# Patient Record
Sex: Female | Born: 1975 | Race: Black or African American | Hispanic: No | Marital: Married | Smoking: Never smoker
Health system: Southern US, Community
[De-identification: ages and names within clinical notes are randomized; demographics above are authoritative.]

## PROBLEM LIST (undated history)

## (undated) DIAGNOSIS — R519 Headache, unspecified: Secondary | ICD-10-CM

## (undated) DIAGNOSIS — F419 Anxiety disorder, unspecified: Secondary | ICD-10-CM

## (undated) DIAGNOSIS — E785 Hyperlipidemia, unspecified: Secondary | ICD-10-CM

## (undated) DIAGNOSIS — R51 Headache: Secondary | ICD-10-CM

## (undated) DIAGNOSIS — K219 Gastro-esophageal reflux disease without esophagitis: Secondary | ICD-10-CM

## (undated) HISTORY — DX: Anxiety disorder, unspecified: F41.9

## (undated) HISTORY — DX: Headache, unspecified: R51.9

## (undated) HISTORY — DX: Gastro-esophageal reflux disease without esophagitis: K21.9

## (undated) HISTORY — DX: Hyperlipidemia, unspecified: E78.5

## (undated) HISTORY — PX: ABDOMINAL HYSTERECTOMY: SHX81

## (undated) HISTORY — DX: Headache: R51

---

## 2004-05-09 ENCOUNTER — Ambulatory Visit: Payer: Self-pay

## 2005-05-12 ENCOUNTER — Emergency Department: Payer: Self-pay | Admitting: Emergency Medicine

## 2006-11-02 ENCOUNTER — Emergency Department: Payer: Self-pay | Admitting: Unknown Physician Specialty

## 2007-04-24 HISTORY — PX: TUBAL LIGATION: SHX77

## 2009-09-20 ENCOUNTER — Ambulatory Visit: Payer: Self-pay

## 2009-09-22 ENCOUNTER — Ambulatory Visit: Payer: Self-pay

## 2010-03-10 ENCOUNTER — Emergency Department: Payer: Self-pay | Admitting: Emergency Medicine

## 2011-01-09 LAB — HM PAP SMEAR: HM Pap smear: NORMAL

## 2011-04-10 LAB — HM MAMMOGRAPHY: HM MAMMO: NORMAL

## 2011-05-08 ENCOUNTER — Emergency Department: Payer: Self-pay | Admitting: Emergency Medicine

## 2011-05-08 LAB — BASIC METABOLIC PANEL
Anion Gap: 11 (ref 7–16)
BUN: 7 mg/dL (ref 7–18)
Calcium, Total: 9.3 mg/dL (ref 8.5–10.1)
Chloride: 105 mmol/L (ref 98–107)
Co2: 26 mmol/L (ref 21–32)
Glucose: 103 mg/dL — ABNORMAL HIGH (ref 65–99)
Osmolality: 281 (ref 275–301)
Sodium: 142 mmol/L (ref 136–145)

## 2011-05-08 LAB — CBC
MCH: 31 pg (ref 26.0–34.0)
MCHC: 34.9 g/dL (ref 32.0–36.0)
MCV: 89 fL (ref 80–100)
Platelet: 367 10*3/uL (ref 150–440)
RDW: 11.8 % (ref 11.5–14.5)
WBC: 5.3 10*3/uL (ref 3.6–11.0)

## 2014-08-26 ENCOUNTER — Ambulatory Visit: Payer: 59 | Admitting: Physical Therapy

## 2014-08-26 ENCOUNTER — Encounter: Payer: Self-pay | Admitting: Physical Therapy

## 2014-08-26 DIAGNOSIS — N393 Stress incontinence (female) (male): Secondary | ICD-10-CM | POA: Insufficient documentation

## 2014-08-26 DIAGNOSIS — R531 Weakness: Secondary | ICD-10-CM

## 2014-08-26 DIAGNOSIS — M629 Disorder of muscle, unspecified: Secondary | ICD-10-CM

## 2014-08-26 DIAGNOSIS — R278 Other lack of coordination: Secondary | ICD-10-CM

## 2014-08-27 NOTE — Patient Instructions (Addendum)
  __log rolling and posture handouts __Bladder irritants and impact on bladder and urinary incontinence, urgency and healthy ratio to water

## 2014-08-27 NOTE — Therapy (Signed)
[place] MAIN [facility] SERVICES [address] [place], [place], [address] Phone: [phone]   Fax:  [phone]  Physical Therapy Evaluation  Patient Details  Name: Tanya Robinson MRN: [id number] Date of Birth: 1975/11/05 Referring Provider:  Nori Riis, PA-C  Encounter Date: 08/26/2014      PT End of Session - 08/27/14 1100    Visit Number 1   Number of Visits 12   Date for PT Re-Evaluation 11/19/14   PT Start Time 9030   PT Stop Time 1702   PT Time Calculation (min) 75 min   Activity Tolerance Patient limited by pain;Patient tolerated treatment well;No increased pain   Behavior During Therapy [facility] for tasks assessed/performed      Past Medical History  Diagnosis Date  . Anxiety   . Headache     Past Surgical History  Procedure Laterality Date  . Abdominal hysterectomy    . Tubal ligation  2009    There were no vitals filed for this visit.  Visit Diagnosis:  Coordination abnormal - Plan: PT plan of care cert/re-cert  Weakness - Plan: PT plan of care cert/re-cert  Fascial defect - Plan: PT plan of care cert/re-cert      Subjective Assessment - 08/26/14 1608    Subjective 1) Urinary leakage after urination 100% of the time that started 6-7 mo. 2) Dyspareunia    Pertinent History Hx vaginal deliveries w/o perineal scars/tears, tubal ligation and hysterectomy. Pt changes panty liners 8-9 pads. Frequency 1x every 2 hrs. DAILY FLUID INTAKE: water 16 fl oz= 3 , sodas reg size =2, 1 cup of juice. Fintness routine for the past 2 months 3x/week: situps/crunches 3sets of 15, treadmill 3 min, UE weights.   Patient Stated Goals get pelvic area as normal as possible to not urinate on self   Currently in Pain? Yes   Pain Score 6    Pain Location Genitalia   Pain Descriptors / Indicators Aching;Throbbing;Other (Comment)  achining with penetration, throbbing post-coitus   Pain Frequency Other (Comment)  50% of the time    Aggravating Factors  supine position,    Pain Relieving Factors other positions             [facility] PT Assessment - 08/27/14 0955    Assessment   Medical Diagnosis urinary leakage and dyspareunia    Onset Date 01/25/14   Next MD Visit --  week of 15th    Precautions   Precautions None   Restrictions   Weight Bearing Restrictions No   Balance Screen   Has the patient fallen in the past 6 months No   Has the patient had a decrease in activity level because of a fear of falling?  No   Is the patient reluctant to leave their home because of a fear of falling?  No   Observation/Other Assessments   Observations rounded shoulders, forward head, thorax collapsed over abdomen in sitting, ankles crossed    Skin Integrity abdominal scars in LQ with restricted mobilty   Other Surveys  Select  PFDI- 53%   Coordination   Coordination and Movement Description rib flare high/ limited depression of ribs, chest breathing. abdominal and pelvic floor straining w/ cue for bowel movement.    AROM   Overall AROM Comments spinal movements with WLF. slightly limited in T-spine ext                 Pelvic Floor Special Questions - 08/27/14 0923  Diastasis Recti no   Perineal Body/Introitus other no abnormal positioning of pelvic organs    External Palpation neg to tenderness    Pelvic Floor Internal Exam posterior activation of pelvic floor mm > anteror mm, applied thiele massage along 2nd layer mm and noted more circumferential contraction of pelvic floor mm post-Tx  Grade 2 without pillow, Grade 3 w/ hips on pillow & post-Tx   Exam Type Vaginal   Strength good squeeze, good lift, able to hold agaisnt strong resistance   Strength # of reps 6   Strength # of seconds 10          [facility] Adult PT Treatment/Exercise - 08/27/14 0955    Bed Mobility   Bed Mobility --  educated on log rolling 2x   Posture/Postural Control   Posture Comments educated/ demo'd proper seated/ standing posture  with stacking principles    Manual Therapy   Manual Therapy Myofascial release   Myofascial Release 2nd layers of pelvic floor mm to faciliate increased circumferential contraction                PT Education - 08/27/14 1057    Education provided Yes   Education Details education on POC, goals, compliance, and benefits of PT, Educated importance of decreased load on pelvic floor with modifications to daily activities and crunches/sit-ups in fitness routine, alignment for seated posture and technique for diaphragmatic breathing (explained benefits and physiology) for rest periods between pelvic floor contractions,    Person(s) Educated Patient   Methods Explanation;Demonstration;Tactile cues;Verbal cues;Handout   Comprehension Verbalized understanding;Returned demonstration             PT Long Term Goals - 08/27/14 1113    PT LONG TERM GOAL #1   Title Pt will report balancing out her bladder irritant: wter ratio from 3:3 to 2:4  in order to participate more community events with decreased frequency to urinate.   Time 12   Period Weeks   Status New   PT LONG TERM GOAL #2   Title  Pt will decrease score on PFDI  from  53  %  to   < 40   % in order to improve QOL.    Time 12   Period Weeks   Status New   PT LONG TERM GOAL #3   Title Pt will demo proper deep core mm techniques throughout 5 fitness exercises in order minimize worsening of Sx and promote wellness.   Time 12   Period Weeks   Status New   PT LONG TERM GOAL #4   Title Pt will increase pelvic floor endurance from 4/10/6/6 to 07/31/08/10 without pillow propped under hips in order to improve urinary Sx and decrease use of pads.   Time 12   Period Weeks   Status New   PT LONG TERM GOAL #5   Title Pt will report a decreased use of pads to < 3 pads / day in order to improve QOL and save costs.   Time 12   Period Weeks   Status New               Plan - 08/27/14 1102    Clinical Impression Statement Pt  is 39 yo female who c/o urinary leakage and urgency.   Pt will benefit from skilled therapeutic intervention in order to improve on the following deficits Decreased endurance;Improper body mechanics;Decreased scar mobility;Postural dysfunction;Decreased activity tolerance;Decreased mobility;Decreased strength;Impaired flexibility;Decreased range of motion;Decreased coordination;Increased fascial restricitons   Rehab Potential  Good   Clinical Impairments Affecting Rehab Potential Hx of hysterectemy, tubal ligation, multiple vaginal deliveries   PT Frequency 1x / week   PT Duration 12 weeks   PT Treatment/Interventions ADLs/Self Care Home Management;Gait training;Neuromuscular re-education;Aquatic Therapy;Stair training;Scar mobilization;Biofeedback;Functional mobility training;Patient/family education;Cryotherapy;Moist Heat;Balance training;Manual techniques;Therapeutic exercise;Therapeutic activities;Other (comment)  Pain science education. Relaxation techniques,  education on proper weight lifting techniques, need for modifications to fitness routine, Bladder irritants and fluid intake   PT Next Visit Plan abdominal scar massage, assess t-spine/mm   PT Home Exercise Plan pelvic floor program and abdominal massage   Consulted and Agree with Plan of Care Patient         Problem List There are no active problems to display for this patient.  Jerl Mina, PT, DPT 08/27/2014, 4:34 PM  [place] MAIN [facility] SERVICES [address] [place], [place], [address] Phone: [phone]   Fax:  [phone]

## 2014-08-30 ENCOUNTER — Encounter: Payer: Self-pay | Admitting: Physical Therapy

## 2014-09-02 ENCOUNTER — Encounter: Payer: Self-pay | Admitting: Physical Therapy

## 2014-09-02 ENCOUNTER — Ambulatory Visit: Payer: 59 | Attending: Urology | Admitting: Physical Therapy

## 2014-09-02 DIAGNOSIS — R531 Weakness: Secondary | ICD-10-CM

## 2014-09-02 DIAGNOSIS — R278 Other lack of coordination: Secondary | ICD-10-CM

## 2014-09-02 DIAGNOSIS — N393 Stress incontinence (female) (male): Secondary | ICD-10-CM | POA: Diagnosis not present

## 2014-09-02 DIAGNOSIS — M629 Disorder of muscle, unspecified: Secondary | ICD-10-CM

## 2014-09-02 NOTE — Therapy (Signed)
[place] MAIN [facility] SERVICES [address] [place], [place], [address] Phone: [phone]   Fax:  [phone]  Physical Therapy Treatment  Patient Details  Name: Tanya Robinson MRN: [id number] Date of Birth: [birth year] Referring Provider:  No ref. provider found  Encounter Date: 09/02/2014      PT End of Session - 09/02/14 1633    Visit Number 2   Number of Visits 12   Date for PT Re-Evaluation 11/19/14   PT Start Time 0932   PT Stop Time 6712   PT Time Calculation (min) 62 min   Activity Tolerance Patient limited by pain;Patient tolerated treatment well;No increased pain   Behavior During Therapy [facility] for tasks assessed/performed      Past Medical History  Diagnosis Date  . Anxiety   . Headache     Past Surgical History  Procedure Laterality Date  . Abdominal hysterectomy    . Tubal ligation  2009    There were no vitals filed for this visit.  Visit Diagnosis:  Coordination abnormal  Weakness  Fascial defect      Subjective Assessment - 09/02/14 1545    Subjective Pt reported she has been practicing HEP and no longer have pain in the pelvic floor mm. Pt has practiced not straining with bowel movement.    Pertinent History Hx vaginal deliveries w/o perineal scars/tears, tubal ligation and hysterectomy. Pt changes panty liners 8-9 pads. Frequency 1x every 2 hrs. DAILY FLUID INTAKE: water 16 fl oz= 3 , sodas reg size =2, 1 cup of juice. Fintness routine for the past 2 months 3x/week: situps/crunches 3sets of 15, treadmill 3 min, UE weights.   Patient Stated Goals get pelvic area as normal as possible to not urinate on self   Currently in Pain? No/denies                         [facility] Adult PT Treatment/Exercise - 09/02/14 1628    Bed Mobility   Bed Mobility --  reviewed log roll, minor cuing   Manual Therapy   Manual Therapy Joint mobilization;Massage;Other (comment)   Joint Mobilization C5-T12 assessed,  noted increased hypomobility at T3-10. Grade III PA SP. Grade II AC [place] joints bil sup/inf. Noted increased moboility post-Tx.   Soft tissue mobilization mm medial to scap bil= increased tensions. STM, MWM openbook. increased scapluar mobility PROM post-Tx                PT Education - 09/02/14 1633    Education Details HEP   Person(s) Educated Patient   Methods Explanation;Demonstration;Tactile cues;Verbal cues;Handout   Comprehension Verbalized understanding;Returned demonstration;Verbal cues required;Tactile cues required             PT Long Term Goals - 09/02/14 1637    PT LONG TERM GOAL #1   Title Pt will report balancing out her bladder irritant: wter ratio from 3:3 to 2:4  in order to participate more community events with decreased frequency to urinate.   Time 12   Period Weeks   Status New   PT LONG TERM GOAL #2   Title  Pt will decrease score on PFDI  from  53  %  to   < 40   % in order to improve QOL.    Time 12   Period Weeks   Status New   PT LONG TERM GOAL #3   Title Pt will demo proper deep core mm techniques throughout  5 fitness exercises in order minimize worsening of Sx and promote wellness.   Time 12   Period Weeks   Status New   PT LONG TERM GOAL #4   Title Pt will increase pelvic floor endurance from 4/10/6/6 to 07/31/08/10 without pillow propped under hips in order to improve urinary Sx and decrease use of pads.   Time 12   Period Weeks   Status New   PT LONG TERM GOAL #5   Title Pt will report a decreased use of pads to < 3 pads / day in order to improve QOL and save costs.   Time 12   Period Weeks   Status New               Plan - 09/02/14 1634    Clinical Impression Statement Pt tolerated manual Tx well to release tensions around thorax and upper shoulders. Pt demo'd increased ability with diaphragmatic excursion and gained better coordination from chest breathing to diaphragmatic breathing with excessive tactile and verbal cues.  Initiated work stretches for increasing thorax mobility. Pt is progress well towards her goals and will benefit from skilled PT to progress to deep core strengthening.    Pt will benefit from skilled therapeutic intervention in order to improve on the following deficits Decreased endurance;Improper body mechanics;Decreased scar mobility;Postural dysfunction;Decreased activity tolerance;Decreased mobility;Decreased strength;Impaired flexibility;Decreased range of motion;Decreased coordination;Increased fascial restricitons   Rehab Potential Good   Clinical Impairments Affecting Rehab Potential Hx of hysterectemy, tubal ligation, multiple vaginal deliveries   PT Frequency 1x / week   PT Duration 12 weeks   PT Treatment/Interventions ADLs/Self Care Home Management;Gait training;Neuromuscular re-education;Aquatic Therapy;Stair training;Scar mobilization;Biofeedback;Functional mobility training;Patient/family education;Cryotherapy;Moist Heat;Balance training;Manual techniques;Therapeutic exercise;Therapeutic activities;Other (comment)  Pain science education. Relaxation techniques,  education on proper weight lifting techniques, need for modifications to fitness routine, Bladder irritants and fluid intake   PT Next Visit Plan abdominal scar massage, assess t-spine/mm   PT Home Exercise Plan pelvic floor program and abdominal massage   Consulted and Agree with Plan of Care Patient        Problem List There are no active problems to display for this patient.   Jerl Mina ,PT, DPT, E-RYT  09/02/2014, 5:17 PM  [place] MAIN [facility] SERVICES [address] [place], [place], [address] Phone: [phone]   Fax:  [phone]

## 2014-09-02 NOTE — Patient Instructions (Addendum)
Handout  open book 15x reps. bil  seated twist 3 reps bil  T-spine ext with deep core engaged,   diaphragmatic breathing in supine and prone. Tactile cuing required.   Neuro-reedu: proper sitting posture with thorax over pelvis and with T-spine ext ex

## 2014-09-08 ENCOUNTER — Ambulatory Visit: Payer: 59 | Admitting: Physical Therapy

## 2014-09-08 ENCOUNTER — Encounter: Payer: Self-pay | Admitting: Physical Therapy

## 2014-09-08 DIAGNOSIS — M629 Disorder of muscle, unspecified: Secondary | ICD-10-CM

## 2014-09-08 DIAGNOSIS — R531 Weakness: Secondary | ICD-10-CM

## 2014-09-08 DIAGNOSIS — N393 Stress incontinence (female) (male): Secondary | ICD-10-CM | POA: Diagnosis not present

## 2014-09-08 DIAGNOSIS — R278 Other lack of coordination: Secondary | ICD-10-CM

## 2014-09-09 NOTE — Patient Instructions (Signed)
Wall Squat   Feet shoulder width apart, a few inches in front of wall, lean against wall by squeezing shoulder blades together and back of head pressed against the wall. Inhale as you lower by bending hips and knees just enough that you can still see your toes.  Exhale, engage deep core muscles as you rise up to stand. Repeat _10___ times. Do _2-3___ sessions per day.  Copyright  VHI. All rights reserved.    You are now ready to begin training the deep core muscles system: diaphragm, transverse abdominis, pelvic floor . These muscles must work together as a team.      The key to these exercises to train the brain to coordinate the timing of these muscles and to have them turn on for long periods of time to hold you upright against gravity (especially important if you are on your feet all day).These muscles are postural muscles and play a role stabilizing your spine and bodyweight. By doing these repetitions slowly and correctly instead of doing crunches, you will achieve a flatter belly without a lower pooch. You are also placing your spine in a more neutral position and breathing properly which in turn, decreases your risk for problems related to your pelvic floor, abdominal, and low back such as pelvic organ prolapse, hernias, diastasis recti (separation of superficial muscles), disk herniations, spinal fractures. These exercises set a solid foundation for you to later progress to resistance/ strength training with therabands and weights and return to other typical fitness exercises with a stronger deeper core.    This week: Practice only Level 1-2. Hold off on 3-4 until our next visit.   -4 until our next visit.

## 2014-09-09 NOTE — Therapy (Signed)
[place] MAIN [facility] SERVICES [address] [place], [place], [address] Phone: [phone]   Fax:  [phone]  Physical Therapy Treatment  Patient Details  Name: NAVIL KOLE MRN: [id number] Date of Birth: [birth year] Referring Provider:  Nori Riis, PA-C  Encounter Date: 09/08/2014    Past Medical History  Diagnosis Date  . Anxiety   . Headache     Past Surgical History  Procedure Laterality Date  . Abdominal hysterectomy    . Tubal ligation  2009    There were no vitals filed for this visit.  Visit Diagnosis:  Coordination abnormal  Weakness  Fascial defect      Subjective Assessment - 09/08/14 1521    Subjective Pt report she has been decreasing soda to .5 can/day and increased her water intake. Pt reported she has been doing her pelvic floor HEP.    Pertinent History Hx vaginal deliveries w/o perineal scars/tears, tubal ligation and hysterectomy. Pt changes panty liners 8-9 pads. Frequency 1x every 2 hrs. DAILY FLUID INTAKE: water 16 fl oz= 3 , sodas reg size =2, 1 cup of juice. Fintness routine for the past 2 months 3x/week: situps/crunches 3sets of 15, treadmill 3 min, UE weights.   Patient Stated Goals get pelvic area as normal as possible to not urinate on self   Currently in Pain? No/denies                         [facility] Adult PT Treatment/Exercise - 09/08/14 1610    Bed Mobility   Bed Mobility Left Sidelying to Sit;Rolling Left  reviewed log roll, moderate cuing 3 reps   Ambulation/Gait   Gait Comments cues for scap retraction/ deep core activation    Exercises   Exercises --  grade 4 w/o pillow, 10 secs, 7 reps w/ cuing to count aloud   Other Exercises  Dyn Stabilization 1-2.  10 reps each, cuing to breathe thru nose, resolved c/o of "running out of  breath"    Knee/Hip Exercises: Standing   Wall Squat 10 reps  cues for scap/ cervical retraction, knee alignment                      PT Long Term Goals - 09/08/14 1525    PT LONG TERM GOAL #1   Title Pt will report balancing out her bladder irritant: wter ratio from 3:3 to 2:4  in order to participate more community events with decreased frequency to urinate.   Time 12   Period Weeks   Status Achieved   PT LONG TERM GOAL #2   Title  Pt will decrease score on PFDI  from  53  %  to   < 40   % in order to improve QOL.    Time 12   Period Weeks   Status On-going   PT LONG TERM GOAL #3   Title Pt will demo proper deep core mm techniques throughout 5 fitness exercises in order minimize worsening of Sx and promote wellness.   Time 12   Period Weeks   Status On-going   PT LONG TERM GOAL #4   Title Pt will increase pelvic floor endurance from 4/10/6/6 to 07/31/08/10 without pillow propped under hips in order to improve urinary Sx and decrease use of pads.   Time 12   Period Weeks   Status On-going   PT LONG TERM GOAL #5   Title Pt will  report a decreased use of pads to < 3 pads / day in order to improve QOL and save costs.   Time 12   Period Weeks   Status On-going               Plan - 09/09/14 0813    Clinical Impression Statement Pt showed significantly improved mm and spinal mobility compared to last session and demo'd proper posture with minimal cuing. Pt was able to progress her pelvic floor HEP and start dynamic stabilization Level 1-2 today. Pt has achieved one goal w/ decreased bladder irritant intake. Pt would continue to benefit from skilled PT to  address her remaining goals.    Pt will benefit from skilled therapeutic intervention in order to improve on the following deficits Decreased endurance;Improper body mechanics;Decreased scar mobility;Postural dysfunction;Decreased activity tolerance;Decreased mobility;Decreased strength;Impaired flexibility;Decreased range of motion;Decreased coordination;Increased fascial restricitons   Rehab Potential Good   Clinical  Impairments Affecting Rehab Potential Hx of hysterectemy, tubal ligation, multiple vaginal deliveries   PT Frequency 1x / week   PT Duration 12 weeks   PT Treatment/Interventions ADLs/Self Care Home Management;Gait training;Neuromuscular re-education;Aquatic Therapy;Stair training;Scar mobilization;Biofeedback;Functional mobility training;Patient/family education;Cryotherapy;Moist Heat;Balance training;Manual techniques;Therapeutic exercise;Therapeutic activities;Other (comment)  Pain science education. Relaxation techniques,  education on proper weight lifting techniques, need for modifications to fitness routine, Bladder irritants and fluid intake   PT Next Visit Plan abdominal scar massage,    Consulted and Agree with Plan of Care Patient        Problem List There are no active problems to display for this patient.   Jerl Mina ,PT, DPT, E-RYT  09/09/2014, 8:17 AM  [place] MAIN [facility] SERVICES [address] [place], [place], [address] Phone: [phone]   Fax:  [phone]

## 2014-09-15 ENCOUNTER — Ambulatory Visit: Payer: 59 | Admitting: Physical Therapy

## 2014-09-15 ENCOUNTER — Encounter: Payer: Self-pay | Admitting: Physical Therapy

## 2014-09-17 ENCOUNTER — Ambulatory Visit: Payer: 59 | Admitting: Physical Therapy

## 2014-09-22 ENCOUNTER — Encounter: Payer: Self-pay | Admitting: Physical Therapy

## 2014-09-29 ENCOUNTER — Ambulatory Visit: Payer: 59 | Attending: Urology | Admitting: Physical Therapy

## 2014-09-29 DIAGNOSIS — R278 Other lack of coordination: Secondary | ICD-10-CM | POA: Diagnosis not present

## 2014-09-29 DIAGNOSIS — R531 Weakness: Secondary | ICD-10-CM | POA: Insufficient documentation

## 2014-09-29 DIAGNOSIS — R32 Unspecified urinary incontinence: Secondary | ICD-10-CM | POA: Diagnosis not present

## 2014-09-29 DIAGNOSIS — N941 Dyspareunia: Secondary | ICD-10-CM | POA: Diagnosis not present

## 2014-09-29 DIAGNOSIS — M629 Disorder of muscle, unspecified: Secondary | ICD-10-CM

## 2014-09-29 NOTE — Patient Instructions (Addendum)
1) Dyn Stab 3-4 10 reps each   2) Handout w/ drawn pictures: 10 reps each  1. Modified pilates  Yellow band on doorknob (elbow bent, hip ER, bridging up w/ heels on stool) 2. Modified pilates   Yellow band on door knob  (elbow straight, bridging up w/ heels and knees parallel  on stool) 3. Relaxation 5 min (towel over eyes)   3)a. pelvic floor 10 sec up to 50 /day 10 x at 8 am, noon, 6pm 5x at 10 am, 2pm, 4pm    B. Quick squeezes coughing, sneezing, laughing

## 2014-09-29 NOTE — Therapy (Signed)
[facility] MAIN REHAB SERVICES [address] [place], [place], [address] Phone: [phone]   Fax:  [phone]  Physical Therapy Treatment  Patient Details  Name: Tanya Robinson MRN: [id number] Date of Birth: 10/03/1975 Referring Provider:  No ref. provider found  Encounter Date: 09/29/2014      PT End of Session - 09/29/14 1503    Visit Number 3   Number of Visits 12   Date for PT Re-Evaluation 11/19/14   PT Start Time 1505   Activity Tolerance Patient limited by pain;Patient tolerated treatment well;No increased pain   Behavior During Therapy WFL for tasks assessed/performed      Past Medical History  Diagnosis Date  . Anxiety   . Headache     Past Surgical History  Procedure Laterality Date  . Abdominal hysterectomy    . Tubal ligation  2009    There were no vitals filed for this visit.  Visit Diagnosis:  Coordination abnormal  Weakness  Fascial defect      Subjective Assessment - 09/29/14 1503    Subjective Pt reported resolution of pelvic pain,  her bowel movements improved without straining, decreased leakage from every toileting trip to only 2x/day across the two week, and changing her panty liners from 8-9 pads to 3 pads/ days. Pt continued walking.    Pertinent History Hx vaginal deliveries w/o perineal scars/tears, tubal ligation and hysterectomy. Pt changes panty liners 8-9 pads. Frequency 1x every 2 hrs. DAILY FLUID INTAKE: water 16 fl oz= 3 , sodas reg size =2, 1 cup of juice. Fintness routine for the past 2 months 3x/week: situps/crunches 3sets of 15, treadmill 3 min, UE weights.   Patient Stated Goals get pelvic area as normal as possible to not urinate on self   Currently in Pain? No/denies                         [facility] Adult PT Treatment/Exercise - 09/29/14 0001    Exercises   Other Exercises  Dyn 3-4 10 reps, modified pilates I-2 yellow band 10 reps   cuing for alignment and activation of  whole body and breath                 PT Education - 09/29/14 1547    Education provided Yes   Education Details HEP   Person(s) Educated Patient   Methods Explanation;Tactile cues;Demonstration;Verbal cues;Handout   Comprehension Verbalized understanding;Returned demonstration             PT Long Term Goals - 09/29/14 1549    PT LONG TERM GOAL #1   Title Pt will report balancing out her bladder irritant: wter ratio from 3:3 to 2:4  in order to participate more community events with decreased frequency to urinate.   Time 12   Period Weeks   Status Achieved   PT LONG TERM GOAL #2   Title  Pt will decrease score on PFDI  from  53  %  to   < 40   % in order to improve QOL.    Time 12   Period Weeks   Status On-going   PT LONG TERM GOAL #3   Title Pt will demo proper deep core mm techniques throughout 5 fitness exercises in order minimize worsening of Sx and promote wellness.   Time 12   Period Weeks   Status Partially Met   PT LONG TERM GOAL #4   Title Pt will   increase pelvic floor endurance from 4/10/6/6 to 07/31/08/10 without pillow propped under hips in order to improve urinary Sx and decrease use of pads.   Time 12   Period Weeks   Status On-going   PT LONG TERM GOAL #5   Title Pt will report a decreased use of pads to < 3 pads / day in order to improve QOL and save costs.   Time 12   Period Weeks   Status Achieved               Plan - 09/29/14 1550    Clinical Impression Statement Pt has achieved the goal related to decreased pad use. Progressed to dynamic stabilization 3-4 and modified pilates w/ yellow theraband. Pt showed fatigue and poor form by 10th rep. Pt also required neuro-reedu for coordination of pelvic floor mm.  Anticipate pt will be  ready for upright exercises at next session.  Pt will continue to benefit from skilled PT.    Pt will benefit from skilled therapeutic intervention in order to improve on the following deficits Decreased  endurance;Improper body mechanics;Decreased scar mobility;Postural dysfunction;Decreased activity tolerance;Decreased mobility;Decreased strength;Impaired flexibility;Decreased range of motion;Decreased coordination;Increased fascial restricitons   Rehab Potential Good   Clinical Impairments Affecting Rehab Potential Hx of hysterectemy, tubal ligation, multiple vaginal deliveries   PT Frequency 1x / week   PT Duration 12 weeks   PT Treatment/Interventions ADLs/Self Care Home Management;Gait training;Neuromuscular re-education;Aquatic Therapy;Stair training;Scar mobilization;Biofeedback;Functional mobility training;Patient/family education;Cryotherapy;Moist Heat;Balance training;Manual techniques;Therapeutic exercise;Therapeutic activities;Other (comment)  Pain science education. Relaxation techniques,  education on proper weight lifting techniques, need for modifications to fitness routine, Bladder irritants and fluid intake   PT Next Visit Plan abdominal scar massage,    Consulted and Agree with Plan of Care Patient        Problem List There are no active problems to display for this patient.   Jerl Mina 09/29/2014, 4:01 PM  [facility] MAIN [facility] SERVICES [address] [place], [place], [address] Phone: [phone]   Fax:  [phone]

## 2014-10-06 ENCOUNTER — Encounter: Payer: Self-pay | Admitting: Physical Therapy

## 2014-10-13 ENCOUNTER — Ambulatory Visit: Payer: 59 | Admitting: Physical Therapy

## 2014-10-13 DIAGNOSIS — R32 Unspecified urinary incontinence: Secondary | ICD-10-CM | POA: Diagnosis not present

## 2014-10-13 DIAGNOSIS — R278 Other lack of coordination: Secondary | ICD-10-CM

## 2014-10-13 DIAGNOSIS — M629 Disorder of muscle, unspecified: Secondary | ICD-10-CM

## 2014-10-13 DIAGNOSIS — R531 Weakness: Secondary | ICD-10-CM

## 2014-10-13 NOTE — Patient Instructions (Addendum)
  Pelvic floor exercises --keep count with fingers, and enjoy the rest break of 4 breaths and lengthen pelvic floor. (elevator)   Continue with yellow band bridging exercise -10 x/ 2 sets/ 2x day.    Clam Shell 45 Degrees with Red band   Lying with hips and knees bent 45, one pillow between knees and ankles. Lift knee with exhale. Be sure pelvis does not roll backward. Do not arch back. Do 10 times, each leg, 2 times per day.  http://ss.exer.us/75   Copyright  VHI. All rights reserved.      Mini Squat: NOT WITH BALL--USE RED BAND on THIGH INSTEAD!    Stand with band over thighs. Squat with head up, reaching back with buttocks as if sitting down. Make sure you can still see your toes and pushing down with four corners of feet as you exhale up.  Repeat 10 times per set. Do 1 sets per session. Do 2sessions per day.  http://orth.exer.us/731   Copyright  VHI. All rights reserved.

## 2014-10-14 NOTE — Therapy (Signed)
[place] MAIN [facility] SERVICES [address] [place], [place], [address] Phone: [phone]   Fax:  [phone]  Physical Therapy Treatment  Patient Details  Name: Tanya Robinson MRN: [id number] Date of Birth: [birth year] Referring Provider:  No ref. provider found  Encounter Date: 10/13/2014      PT End of Session - 10/13/14 0911    Visit Number 4   Number of Visits 12   Date for PT Re-Evaluation 11/19/14   PT Start Time 0907   PT Stop Time 1000   PT Time Calculation (min) 53 min   Activity Tolerance Patient limited by pain;Patient tolerated treatment well;No increased pain   Behavior During Therapy [facility] for tasks assessed/performed      Past Medical History  Diagnosis Date  . Anxiety   . Headache     Past Surgical History  Procedure Laterality Date  . Abdominal hysterectomy    . Tubal ligation  2009    There were no vitals filed for this visit.  Visit Diagnosis:  Coordination abnormal  Weakness  Fascial defect      Subjective Assessment - 10/14/14 1432    Subjective Pt reports her Sx are better but still using 3 pads/ day. Urinary leakage has improved by 75%.    Pertinent History Hx vaginal deliveries w/o perineal scars/tears, tubal ligation and hysterectomy. Pt changes panty liners 8-9 pads. Frequency 1x every 2 hrs. DAILY FLUID INTAKE: water 16 fl oz= 3 , sodas reg size =2, 1 cup of juice. Fintness routine for the past 2 months 3x/week: situps/crunches 3sets of 15, treadmill 3 min, UE weights.   Patient Stated Goals get pelvic area as normal as possible to not urinate on self                      Pelvic Floor Special Questions - 10/14/14 1441    Pelvic Floor Internal Exam circumerefential contraction and without pillow under hip  delayed relaxation, cue for rest period of 4 breaths.    Exam Type Vaginal   Strength strong squeeze, against strong resistance   Strength # of reps 10   Strength # of seconds 10    Biofeedback required cue for  complete relaxation of pelvic floor mm, required "elevator" imagery and tactile cuing  noted hyperactivity of pelvic floor upon digital  insertion           [facility] Adult PT Treatment/Exercise - 10/14/14 1432    Bed Mobility   Bed Mobility Sit to Supine  showed good carry over   Exercises   Other Exercises  reviewed modified pilates (provided more cues for neck/scap engagement)  10 reps   Knee/Hip Exercises: Standing   Functional Squat 20 reps  cues for alignment/coordination (red valgus band over thighs   Knee/Hip Exercises: Sidelying   Clams with red band 10 reps bil  deep core engaged                PT Education - 10/14/14 1442    Education provided Yes   Education Details HEP   Person(s) Educated Patient   Methods Explanation;Demonstration;Tactile cues;Verbal cues;Handout   Comprehension Verbalized understanding;Returned demonstration             PT Long Term Goals - 10/13/14 0910    PT LONG TERM GOAL #1   Title Pt will report balancing out her bladder irritant: wter ratio from 3:3 to 2:4  in order to participate more community events with  decreased frequency to urinate.   Time 12   Period Weeks   Status Achieved   PT LONG TERM GOAL #2   Title  Pt will decrease score on PFDI  from  53  %  to   < 40   % in order to improve QOL.    Time 12   Period Weeks   Status On-going   PT LONG TERM GOAL #3   Title Pt will demo proper deep core mm techniques throughout 5 fitness exercises in order minimize worsening of Sx and promote wellness.   Time 12   Period Weeks   Status Partially Met   PT LONG TERM GOAL #4   Title Pt will increase pelvic floor endurance from 4/10/6/6 to 07/31/08/10 without pillow propped under hips in order to improve urinary Sx and decrease use of pads.   Time 12   Period Weeks   Status Achieved   PT LONG TERM GOAL #5   Title Pt will report a decreased use of pads to < 3 pads / day in order to improve QOL  and save costs.   Time 12   Period Weeks   Status Achieved               Plan - 10/14/14 1443    Clinical Impression Statement Pt showed hyperactivity of the pelvic floor mm and delayed relaxation of the pelvic floor mm, thus required cuing to not rush through her pelvic floor HEP and to not overdo the contractions by keeping count. Otherwise, pt demo increased pelvic floor endurance without hips elevated on pillow which indicate increased tensigrity of her abdominopelvic area. Pt is progressing well and gaining awareness of proper alignment and proper mm engagement in fitness type of exercises and will continue to benefit from skilled PT for 2-3 more visits in order to achieve her goals.    Pt will benefit from skilled therapeutic intervention in order to improve on the following deficits Decreased endurance;Improper body mechanics;Decreased scar mobility;Postural dysfunction;Decreased activity tolerance;Decreased mobility;Decreased strength;Impaired flexibility;Decreased range of motion;Decreased coordination;Increased fascial restricitons   Rehab Potential Good   Clinical Impairments Affecting Rehab Potential Hx of hysterectemy, tubal ligation, multiple vaginal deliveries   PT Frequency 1x / week   PT Duration 12 weeks   PT Treatment/Interventions ADLs/Self Care Home Management;Gait training;Neuromuscular re-education;Aquatic Therapy;Stair training;Scar mobilization;Biofeedback;Functional mobility training;Patient/family education;Cryotherapy;Moist Heat;Balance training;Manual techniques;Therapeutic exercise;Therapeutic activities;Other (comment)  Pain science education. Relaxation techniques,  education on proper weight lifting techniques, need for modifications to fitness routine, Bladder irritants and fluid intake   PT Next Visit Plan abdominal scar massage   Consulted and Agree with Plan of Care Patient        Problem List There are no active problems to display for this  patient.   Jerl Mina ,PT, DPT, E-RYT  10/14/2014, 2:50 PM  [place] MAIN [facility] SERVICES [address] [place], [place], [address] Phone: [phone]   Fax:  [phone]

## 2014-10-20 ENCOUNTER — Encounter: Payer: Self-pay | Admitting: Physical Therapy

## 2014-10-28 ENCOUNTER — Ambulatory Visit: Payer: 59 | Attending: Urology | Admitting: Physical Therapy

## 2014-10-28 DIAGNOSIS — X58XXXS Exposure to other specified factors, sequela: Secondary | ICD-10-CM | POA: Insufficient documentation

## 2014-10-28 DIAGNOSIS — S93401S Sprain of unspecified ligament of right ankle, sequela: Secondary | ICD-10-CM | POA: Diagnosis present

## 2014-10-28 DIAGNOSIS — R531 Weakness: Secondary | ICD-10-CM

## 2014-10-28 DIAGNOSIS — R278 Other lack of coordination: Secondary | ICD-10-CM

## 2014-10-28 DIAGNOSIS — S63501S Unspecified sprain of right wrist, sequela: Secondary | ICD-10-CM

## 2014-10-28 DIAGNOSIS — M629 Disorder of muscle, unspecified: Secondary | ICD-10-CM

## 2014-10-29 NOTE — Therapy (Signed)
[place] MAIN [facility] SERVICES [address] [place], [place], [address] Phone: [phone]   Fax:  [phone]  Physical Therapy Treatment  Patient Details  Name: Tanya Robinson MRN: [id number] Date of Birth: 08-Aug-1975 Referring Provider:  Nori Riis, PA-C  Encounter Date: 10/28/2014      PT End of Session - 10/29/14 2210    Visit Number 5   Number of Visits 12   Date for PT Re-Evaluation 11/19/14   PT Start Time 1600   PT Stop Time 1700   PT Time Calculation (min) 60 min   Activity Tolerance Patient limited by pain;Patient tolerated treatment well;No increased pain   Behavior During Therapy [facility] for tasks assessed/performed      Past Medical History  Diagnosis Date  . Anxiety   . Headache     Past Surgical History  Procedure Laterality Date  . Abdominal hysterectomy    . Tubal ligation  2009    There were no vitals filed for this visit.  Visit Diagnosis:  Coordination abnormal - Plan: PT plan of care cert/re-cert  Weakness - Plan: PT plan of care cert/re-cert  Fascial defect - Plan: PT plan of care cert/re-cert  Wrist sprain, right, sequela - Plan: PT plan of care cert/re-cert  Ankle sprain, right, sequela - Plan: PT plan of care cert/re-cert      Subjective Assessment - 10/28/14 1609    Subjective A week ago, pt fell while going up the stairs too quickly and was talking/turning which caused her to miss a step. Pt landed on R wrist in flexion and twisted her R ankle, applied RICE. Currently she has no pain in either areas. Pt reports her HEP have been  working out for her.  Pt has not been relying on urinary pads for 1 week.     Pertinent History Hx vaginal deliveries w/o perineal scars/tears, tubal ligation and hysterectomy. Pt changes panty liners 8-9 pads. Frequency 1x every 2 hrs. DAILY FLUID INTAKE: water 16 fl oz= 3 , sodas reg size =2, 1 cup of juice. Fintness routine for the past 2 months 3x/week:  situps/crunches 3sets of 15, treadmill 3 min, UE weights.   Patient Stated Goals get pelvic area as normal as possible to not urinate on self            [facility] PT Assessment - 10/29/14 2128    ROM / Strength   AROM / PROM / Strength --  eversion R 4/5, L 5/5. wrist strength WNL bil   AROM   Overall AROM Comments bil ankle AROM/PROM WNL   Palpation   Palpation comment no swelling around bil ankle   slight swelling over R wrist > L                  Pelvic Floor Special Questions - 10/29/14 2136    Pelvic Floor Internal Exam circumferential without pillow    Exam Type Vaginal   Strength strong squeeze, against strong resistance   Strength # of reps 10   Strength # of seconds 10   Biofeedback good carry w/ relaxing pelvic floor mm. cuing only for contraction after exhaling           [facility] Adult PT Treatment/Exercise - 10/29/14 2128    Bed Mobility   Bed Mobility --  required cuing for log rolling   Therapeutic Activites    Therapeutic Activities Other Therapeutic Activities   Other Therapeutic Activities treadmill walking 6 min level  ground w/o UE support cues for lighter landing, exhaling for deep core, w/o arm support to allow trunkrotation/arm swing, to perform warm-up on level ground (pt self-selects for walking on incline at beginning of workout), jogging on level ground 2 min at 2.5 mph w/ similar cues.   no ankle pain post-treadmill. pt demo'd proper stretches.   Neuro Re-ed    Neuro Re-ed Details  1. walking/running cues 2.squat: good carry over w/ knee /ankle alignment. progressed to squat w/10 dumbbell by sternum. cues for decreased lumbar lordosis,   no back pain post-proper form cuing.                PT Education - 10/29/14 2209    Education provided Yes   Education Details HEP   Person(s) Educated Patient   Methods Explanation;Demonstration;Tactile cues;Verbal cues   Comprehension Verbalized understanding;Returned demonstration              PT Long Term Goals - 10/29/14 2220    PT LONG TERM GOAL #1   Title Pt will report balancing out her bladder irritant: wter ratio from 3:3 to 2:4  in order to participate more community events with decreased frequency to urinate.   Time 12   Period Weeks   Status Achieved   PT LONG TERM GOAL #2   Title  Pt will decrease score on PFDI  from  53  %  to   < 40   % in order to improve QOL.    Time 12   Period Weeks   Status On-going   PT LONG TERM GOAL #3   Title Pt will demo proper deep core mm techniques throughout 5 fitness exercises in order minimize worsening of Sx and promote wellness.   Time 12   Period Weeks   Status Partially Met   PT LONG TERM GOAL #4   Title Pt will increase pelvic floor endurance from 4/10/6/6 to 07/31/08/10 without pillow propped under hips in order to improve urinary Sx and decrease use of pads.   Time 12   Period Weeks   Status Achieved   PT LONG TERM GOAL #5   Title Pt will report a decreased use of pads to < 3 pads / day in order to improve QOL and save costs.    Time 12   Period Weeks   Status Achieved   Additional Long Term Goals   Additional Long Term Goals Yes   PT LONG TERM GOAL #6   Title Pt will demo increased eversion strength on R ankle from 4/5 to 5/5 in order to minimize risk for injuries when running.   Time 12   Period Weeks   Status New               Plan - 10/29/14 2211    Clinical Impression Statement Pt has achieved her goal as she has no dependency of urinary pads for the past week. Pt showed good carry over w/ ability to relax pelvic floor properly w. Lorelei Pont and also showed decreased knee valgus w/ squats. Pt required minor cuing for contraction of pelvic floor post-exhalation and for more posterior COM in squat to protect knee joints. Due to pt's report of fall, assessment of pt's R ankle and wrist showed decreased ankle eversion strength and  minor swelling on R wrist with normal AROM/PROM/ strength.  Although, pt has been able to resume ADLs without pain, pt will benefit from skilled PT to increase her ankle strength and continue being monitored  for her wrist in order to minimize her risk for injuries w/ fitness activities. Therefore, additional diagnosis codes have been added to this re-cert. Anticipate pt will continue to progress well towards her goals.     Pt will benefit from skilled therapeutic intervention in order to improve on the following deficits Decreased endurance;Improper body mechanics;Decreased scar mobility;Postural dysfunction;Decreased activity tolerance;Decreased mobility;Decreased strength;Impaired flexibility;Decreased range of motion;Decreased coordination;Increased fascial restricitons   Rehab Potential Good   Clinical Impairments Affecting Rehab Potential Hx of hysterectemy, tubal ligation, multiple vaginal deliveries   PT Frequency 1x / week   PT Duration 12 weeks   PT Treatment/Interventions ADLs/Self Care Home Management;Gait training;Neuromuscular re-education;Aquatic Therapy;Stair training;Scar mobilization;Biofeedback;Functional mobility training;Patient/family education;Cryotherapy;Moist Heat;Balance training;Manual techniques;Therapeutic exercise;Therapeutic activities;Other (comment)  Pain science education. Relaxation techniques,  education on proper weight lifting techniques, need for modifications to fitness routine, Bladder irritants and fluid intake   PT Next Visit Plan Initiate resistive strengthening R ankle eversion   Consulted and Agree with Plan of Care Patient        Problem List There are no active problems to display for this patient.   Jerl Mina ,PT, DPT, E-RYT  10/29/2014, 10:34 PM  [place] MAIN [facility] SERVICES [address] [place], [place], [address] Phone: [phone]   Fax:  [phone]

## 2014-10-29 NOTE — Patient Instructions (Addendum)
Progress to seated pelvic floor mm contractions  5 reps x 3 sets at work, 5 reps x 3 sets laying down at night/ total 30  Per day  Applying running gait cues: land soft, allow arm swing (no hands on rail), breathing out for deep core activation, warm-up/cool down with level ground (not w/ incline)  This week: walking only due to recent minor injury to ankle, next week: 10 min walk, 10 min jog, 10 min walk  Squats with 10# weight (5 reps)  w/ more posterior COM and no sway back ( tactile cue w/ thumb on ribs and index finger on ASIS)

## 2014-11-02 ENCOUNTER — Encounter: Payer: Self-pay | Admitting: Family Medicine

## 2014-11-02 ENCOUNTER — Encounter (INDEPENDENT_AMBULATORY_CARE_PROVIDER_SITE_OTHER): Payer: Self-pay

## 2014-11-02 ENCOUNTER — Ambulatory Visit (INDEPENDENT_AMBULATORY_CARE_PROVIDER_SITE_OTHER): Payer: 59 | Admitting: Family Medicine

## 2014-11-02 VITALS — BP 102/78 | HR 75 | Temp 98.0°F | Resp 14 | Ht 65.5 in | Wt 169.0 lb

## 2014-11-02 DIAGNOSIS — G43001 Migraine without aura, not intractable, with status migrainosus: Secondary | ICD-10-CM | POA: Diagnosis not present

## 2014-11-02 DIAGNOSIS — E785 Hyperlipidemia, unspecified: Secondary | ICD-10-CM | POA: Insufficient documentation

## 2014-11-02 DIAGNOSIS — R32 Unspecified urinary incontinence: Secondary | ICD-10-CM | POA: Insufficient documentation

## 2014-11-02 DIAGNOSIS — E663 Overweight: Secondary | ICD-10-CM | POA: Insufficient documentation

## 2014-11-02 DIAGNOSIS — E559 Vitamin D deficiency, unspecified: Secondary | ICD-10-CM | POA: Insufficient documentation

## 2014-11-02 DIAGNOSIS — R319 Hematuria, unspecified: Secondary | ICD-10-CM | POA: Insufficient documentation

## 2014-11-02 DIAGNOSIS — G43009 Migraine without aura, not intractable, without status migrainosus: Secondary | ICD-10-CM | POA: Insufficient documentation

## 2014-11-02 MED ORDER — TRAMADOL HCL 50 MG PO TABS: 50.0000 mg | ORAL_TABLET | Freq: Three times a day (TID) | ORAL | Status: DC | PRN

## 2014-11-02 MED ORDER — PREDNISONE 20 MG PO TABS: 20.0000 mg | ORAL_TABLET | Freq: Two times a day (BID) | ORAL | Status: DC

## 2014-11-02 MED ORDER — BACLOFEN 20 MG PO TABS: 20.0000 mg | ORAL_TABLET | Freq: Three times a day (TID) | ORAL | Status: DC

## 2014-11-02 NOTE — Patient Instructions (Signed)
Migraine Headache A migraine headache is an intense, throbbing pain on one or both sides of your head. A migraine can last for 30 minutes to several hours. CAUSES  The exact cause of a migraine headache is not always known. However, a migraine may be caused when nerves in the brain become irritated and release chemicals that cause inflammation. This causes pain. Certain things may also trigger migraines, such as:  Alcohol.  Smoking.  Stress.  Menstruation.  Aged cheeses.  Foods or drinks that contain nitrates, glutamate, aspartame, or tyramine.  Lack of sleep.  Chocolate.  Caffeine.  Hunger.  Physical exertion.  Fatigue.  Medicines used to treat chest pain (nitroglycerine), birth control pills, estrogen, and some blood pressure medicines. SIGNS AND SYMPTOMS  Pain on one or both sides of your head.  Pulsating or throbbing pain.  Severe pain that prevents daily activities.  Pain that is aggravated by any physical activity.  Nausea, vomiting, or both.  Dizziness.  Pain with exposure to bright lights, loud noises, or activity.  General sensitivity to bright lights, loud noises, or smells. Before you get a migraine, you may get warning signs that a migraine is coming (aura). An aura may include:  Seeing flashing lights.  Seeing bright spots, halos, or zigzag lines.  Having tunnel vision or blurred vision.  Having feelings of numbness or tingling.  Having trouble talking.  Having muscle weakness. DIAGNOSIS  A migraine headache is often diagnosed based on:  Symptoms.  Physical exam.  A CT scan or MRI of your head. These imaging tests cannot diagnose migraines, but they can help rule out other causes of headaches. TREATMENT Medicines may be given for pain and nausea. Medicines can also be given to help prevent recurrent migraines.  HOME CARE INSTRUCTIONS  Only take over-the-counter or prescription medicines for pain or discomfort as directed by your  health care provider. The use of long-term narcotics is not recommended.  Lie down in a dark, quiet room when you have a migraine.  Keep a journal to find out what may trigger your migraine headaches. For example, write down:  What you eat and drink.  How much sleep you get.  Any change to your diet or medicines.  Limit alcohol consumption.  Quit smoking if you smoke.  Get 7-9 hours of sleep, or as recommended by your health care provider.  Limit stress.  Keep lights dim if bright lights bother you and make your migraines worse. SEEK IMMEDIATE MEDICAL CARE IF:   Your migraine becomes severe.  You have a fever.  You have a stiff neck.  You have vision loss.  You have muscular weakness or loss of muscle control.  You start losing your balance or have trouble walking.  You feel faint or pass out.  You have severe symptoms that are different from your first symptoms. MAKE SURE YOU:   Understand these instructions.  Will watch your condition.  Will get help right away if you are not doing well or get worse. Document Released: 04/09/2005 Document Revised: 08/24/2013 Document Reviewed: 12/15/2012 [facility] Patient Information 2015 [place], [place]. This information is not intended to replace advice given to you by your health care provider. Make sure you discuss any questions you have with your health care provider.

## 2014-11-02 NOTE — Progress Notes (Signed)
Name: Tanya Robinson   MRN: [id number]    DOB: 1975-07-17   Date:11/02/2014       Progress Note  Subjective  Chief Complaint  Chief Complaint  Patient presents with  . Otalgia    onset 1 day bilateral  . Headache    onset 1 day constant,frontal    HPI  Migraine headaches: she states her episodes are very seldom, last episode maybe 6 months ago. Symptoms started yesterday at work, facial pain, bilateral ear pain, photophobia, and allodynia no nausea or vomiting, no dizziness or neuro deficit. She took an Aleve last night and went to sleep but woke up with the same headache. She denies any aura.    Patient Active Problem List   Diagnosis Date Noted  . Dyslipidemia 11/02/2014  . Blood in the urine 11/02/2014  . Migraine without aura and responsive to treatment 11/02/2014  . Overweight 11/02/2014  . Vitamin D deficiency 11/02/2014  . Urinary incontinence in female 11/02/2014    Past Surgical History  Procedure Laterality Date  . Abdominal hysterectomy    . Tubal ligation  2009    Family History  Problem Relation Age of Onset  . Hypertension Mother   . Hyperlipidemia Mother   . Diabetes Father     History   Social History  . Marital Status: Married    Spouse Name: N/A  . Number of Children: N/A  . Years of Education: N/A   Occupational History  . Not on file.   Social History Main Topics  . Smoking status: Never Smoker   . Smokeless tobacco: Never Used  . Alcohol Use: No  . Drug Use: No  . Sexual Activity: Yes    Birth Control/ Protection: Surgical   Other Topics Concern  . Not on file   Social History Narrative     Current outpatient prescriptions:  .  EPINEPHRINE, ANAPHYLAXIS THERAPY AGENTS,, EPIPEN 2-PAK, 0.3MG/0.3ML (Injection Device)  1 Device use as directed for 30 days  Quantity: 2;  Refills: 1   Ordered :30-Jun-2012  Steele Sizer MD;  Started 30-Jun-2012 Active, Disp: , Rfl:  .  solifenacin (VESICARE) 10 MG tablet, Take by mouth daily., Disp: ,  Rfl:  .  baclofen (LIORESAL) 20 MG tablet, Take 1 tablet (20 mg total) by mouth 3 (three) times daily. Prn migraine, Disp: 30 each, Rfl: 0 .  predniSONE (DELTASONE) 20 MG tablet, Take 1 tablet (20 mg total) by mouth 2 (two) times daily with a meal. PRN - two doses per episode of migraine, Disp: 4 tablet, Rfl: 0 .  traMADol (ULTRAM) 50 MG tablet, Take 1 tablet (50 mg total) by mouth every 8 (eight) hours as needed (migraine)., Disp: 10 tablet, Rfl: 0  Allergies  Allergen Reactions  . Bee Venom     Bee     ROS  Ten systems reviewed and is negative except as mentioned in HPI  Objective  Filed Vitals:   11/02/14 0840  BP: 102/78  Pulse: 75  Temp: 98 F (36.7 C)  TempSrc: Oral  Resp: 14  Height: 5' 5.5" (1.664 m)  Weight: 169 lb (76.658 kg)  SpO2: 92%    Body mass index is 27.69 kg/(m^2).  Physical Exam  Constitutional: Patient appears well-developed and well-nourished. No distress. Overweight Eyes:  No scleral icterus. PERL Neck: Normal range of motion. Neck supple. Ears: normal exam Cardiovascular: Normal rate, regular rhythm and normal heart sounds.  No murmur heard. No BLE edema. Pulmonary/Chest: Effort normal and breath sounds  normal. No respiratory distress. Abdominal: Soft.  There is no tenderness. Psychiatric: Patient has a normal mood and affect. behavior is normal. Judgment and thought content normal. Neuro: Awake, alert , oriented, normal cranial nerves, normal reflexes , tender to touch of scalp, normal balance.    PHQ2/9: Depression screen PHQ 2/9 11/02/2014  Decreased Interest 0  Down, Depressed, Hopeless 0  PHQ - 2 Score 0     Fall Risk: Fall Risk  11/02/2014  Falls in the past year? No      Assessment & Plan  1. Migraine without aura and with status migrainosus, not intractable Excuse for work today, try medications, call back if no improvement - traMADol (ULTRAM) 50 MG tablet; Take 1 tablet (50 mg total) by mouth every 8 (eight) hours as  needed (migraine).  Dispense: 10 tablet; Refill: 0 - predniSONE (DELTASONE) 20 MG tablet; Take 1 tablet (20 mg total) by mouth 2 (two) times daily with a meal. PRN - two doses per episode of migraine  Dispense: 4 tablet; Refill: 0 - baclofen (LIORESAL) 20 MG tablet; Take 1 tablet (20 mg total) by mouth 3 (three) times daily. Prn migraine  Dispense: 30 each; Refill: 0

## 2014-11-15 ENCOUNTER — Ambulatory Visit: Payer: 59 | Admitting: Physical Therapy

## 2014-11-15 DIAGNOSIS — R278 Other lack of coordination: Secondary | ICD-10-CM

## 2014-11-15 DIAGNOSIS — R531 Weakness: Secondary | ICD-10-CM

## 2014-11-15 DIAGNOSIS — S63501S Unspecified sprain of right wrist, sequela: Secondary | ICD-10-CM

## 2014-11-15 DIAGNOSIS — M629 Disorder of muscle, unspecified: Secondary | ICD-10-CM

## 2014-11-15 DIAGNOSIS — S93401S Sprain of unspecified ligament of right ankle, sequela: Secondary | ICD-10-CM

## 2014-11-15 NOTE — Patient Instructions (Signed)
Single Heel raises  15 L 10 R. 3 x days  Heel toe walking on rolled yoga mat lengthwise 10 x  Red band: 15 rep L, 10 R   L ankle eversion Standing on one leg/ bicep curls

## 2014-11-16 NOTE — Therapy (Signed)
[place] MAIN [facility] SERVICES [address] [place], [place], [address] Phone: [phone]   Fax:  [phone]  Physical Therapy Treatment  Patient Details  Name: Tanya Robinson MRN: [id number] Date of Birth: May 05, 1975 Referring Provider:  Nori Riis, PA-C  Encounter Date: 11/15/2014      PT End of Session - 11/16/14 1924    Visit Number 6   Number of Visits 12   Date for PT Re-Evaluation 11/19/14   PT Start Time 1304   PT Stop Time 1350   PT Time Calculation (min) 46 min   Activity Tolerance Patient limited by pain;Patient tolerated treatment well;No increased pain   Behavior During Therapy [facility] for tasks assessed/performed      Past Medical History  Diagnosis Date  . Anxiety   . Headache     Past Surgical History  Procedure Laterality Date  . Abdominal hysterectomy    . Tubal ligation  2009    There were no vitals filed for this visit.  Visit Diagnosis:  Coordination abnormal  Weakness  Fascial defect  Wrist sprain, right, sequela  Ankle sprain, right, sequela      Subjective Assessment - 11/16/14 1926    Subjective Pt reported she had been sick last week and was resting most of the time. Pt has been IND of using urinary pads for the past 2 weeks and is able to complete urination.    Pertinent History Hx vaginal deliveries w/o perineal scars/tears, tubal ligation and hysterectomy. Pt changes panty liners 8-9 pads. Frequency 1x every 2 hrs. DAILY FLUID INTAKE: water 16 fl oz= 3 , sodas reg size =2, 1 cup of juice. Fintness routine for the past 2 months 3x/week: situps/crunches 3sets of 15, treadmill 3 min, UE weights.   Patient Stated Goals get pelvic area as normal as possible to not urinate on self            [facility] PT Assessment - 11/16/14 1917    AROM   Overall AROM Comments L ankle 4+/5, R ankle 5/5/   last visit L 4/5    Palpation   Palpation comment no swelling on R wrist                      [facility] Adult PT Treatment/Exercise - 11/16/14 1917    High Level Balance   High Level Balance Activities --  SLS w/ Red band bicep curls 15 reps on L, 10 rep on R, heel    High Level Balance Comments --  heel to walking on rolled yoga mat lengthwise 10 x   Exercises   Other Exercises  Red band L ankle eversion, 10 reps (Blue band was too difficult)    Knee/Hip Exercises: Standing   Heel Raises --  15 rep on L 10 rep on R UE on wall   Heel Raises Limitations cue for not locking knee and eccentrol control                PT Education - 11/16/14 1918    Education provided Yes   Education Details HEP   Person(s) Educated Patient   Methods Explanation;Demonstration;Tactile cues;Verbal cues;Handout   Comprehension Verbalized understanding;Returned demonstration;Verbal cues required             PT Long Term Goals - 11/16/14 1922    PT LONG TERM GOAL #1   Title Pt will report balancing out her bladder irritant: wter ratio from 3:3 to 2:4  in order to participate more community events with decreased frequency to urinate.   Time 12   Period Weeks   Status Achieved   PT LONG TERM GOAL #2   Title  Pt will decrease score on PFDI  from  53  %  to   < 40   % in order to improve QOL.  (11/16/14: 0%)    Time 12   Period Weeks   Status Achieved   PT LONG TERM GOAL #3   Title Pt will demo proper deep core mm techniques throughout 5 fitness exercises in order minimize worsening of Sx and promote wellness.   Time 12   Period Weeks   Status Partially Met   PT LONG TERM GOAL #4   Title Pt will increase pelvic floor endurance from 4/10/6/6 to 07/31/08/10 without pillow propped under hips in order to improve urinary Sx and decrease use of pads.   Time 12   Period Weeks   Status Achieved   PT LONG TERM GOAL #5   Title Pt will report a decreased use of pads to < 3 pads / day in order to improve QOL and save costs.    Time 12   Period Weeks   Status  Achieved   PT LONG TERM GOAL #6   Title Pt will demo increased eversion strength on R ankle from 4/5 to 5/5 in order to minimize risk for injuries when running.   Time 12   Period Weeks   Status Partially Met               Plan - 11/15/14 1348    Clinical Impression Statement Pt showed no more swelling in ankle/wrist. Focused HEP on balancing and deep core in upright positions w/ yellow band. Pt required downgrade from blue band to red for ankle strengthening exercises. Pt demo'd difficulty with SLS and single heel raises.Continue to address previous ankle injury and deep core coordination in order to minimize her risk for injuries as she returns to her fitness routine of walking on trails and uneven ground. Anticipate pt will achieve her goals.      Pt will benefit from skilled therapeutic intervention in order to improve on the following deficits Decreased endurance;Improper body mechanics;Decreased scar mobility;Postural dysfunction;Decreased activity tolerance;Decreased mobility;Decreased strength;Impaired flexibility;Decreased range of motion;Decreased coordination;Increased fascial restricitons   Rehab Potential Good   Clinical Impairments Affecting Rehab Potential Hx of hysterectemy, tubal ligation, multiple vaginal deliveries   PT Frequency 1x / week   PT Duration 12 weeks   PT Treatment/Interventions ADLs/Self Care Home Management;Gait training;Neuromuscular re-education;Aquatic Therapy;Stair training;Scar mobilization;Biofeedback;Functional mobility training;Patient/family education;Cryotherapy;Moist Heat;Balance training;Manual techniques;Therapeutic exercise;Therapeutic activities;Other (comment)  Pain science education. Relaxation techniques,  education on proper weight lifting techniques, need for modifications to fitness routine, Bladder irritants and fluid intake   PT Next Visit Plan abdominal scar massage   Consulted and Agree with Plan of Care Patient        Problem  List Patient Active Problem List   Diagnosis Date Noted  . Dyslipidemia 11/02/2014  . Blood in the urine 11/02/2014  . Migraine without aura and responsive to treatment 11/02/2014  . Overweight 11/02/2014  . Vitamin D deficiency 11/02/2014  . Urinary incontinence in female 11/02/2014    Sara Chu, DPT, E-RYT  11/16/2014, 7:26 PM  [place] MAIN [facility] SERVICES [address] [place], [place], [address] Phone: [phone]   Fax:  [phone]

## 2014-12-02 ENCOUNTER — Ambulatory Visit: Payer: 59 | Admitting: Physical Therapy

## 2014-12-16 ENCOUNTER — Ambulatory Visit: Payer: 59 | Admitting: Physical Therapy

## 2014-12-20 ENCOUNTER — Ambulatory Visit: Payer: 59 | Attending: Urology | Admitting: Physical Therapy

## 2014-12-20 DIAGNOSIS — S93401S Sprain of unspecified ligament of right ankle, sequela: Secondary | ICD-10-CM

## 2014-12-20 DIAGNOSIS — M629 Disorder of muscle, unspecified: Secondary | ICD-10-CM

## 2014-12-20 DIAGNOSIS — R278 Other lack of coordination: Secondary | ICD-10-CM

## 2014-12-20 DIAGNOSIS — R531 Weakness: Secondary | ICD-10-CM

## 2014-12-20 DIAGNOSIS — S63501S Unspecified sprain of right wrist, sequela: Secondary | ICD-10-CM

## 2014-12-20 NOTE — Therapy (Signed)
[place] MAIN [facility] SERVICES [address] [place], [place], [address] Phone: [phone]   Fax:  [phone]  Physical Therapy Discharge   Patient Details  Name: Tanya Robinson MRN: [id number] Date of Birth: 01/22/1976 Referring Provider:  Nori Riis, PA-C  Encounter Date: 12/20/2014     Total of 7 visits  from 09/26/14-11/15/14  Pt has achieved all of her urinary goals with report of no urinary pad use for over 1.5 month. Pt is ready for discharge. Thank you for your referral.     Past Medical History  Diagnosis Date  . Anxiety   . Headache     Past Surgical History  Procedure Laterality Date  . Abdominal hysterectomy    . Tubal ligation  2009    There were no vitals filed for this visit.  Visit Diagnosis:  Weakness  Wrist sprain, right, sequela  Coordination abnormal  Ankle sprain, right, sequela  Fascial defect      Subjective Assessment - 12/20/14 1512    Subjective Over the past month, pt has continue to not use urinary pads, going to urinate without delaying, and not needing the squatty potty because bowel eliminations are easier.  Pt reports no issues with her wrist and ankle. Pt has continued back to walking on the track regularly.    Pertinent History Hx vaginal deliveries w/o perineal scars/tears, tubal ligation and hysterectomy. Pt changes panty liners 8-9 pads. Frequency 1x every 2 hrs. DAILY FLUID INTAKE: water 16 fl oz= 3 , sodas reg size =2, 1 cup of juice. Fintness routine for the past 2 months 3x/week: situps/crunches 3sets of 15, treadmill 3 min, UE weights.   Patient Stated Goals get pelvic area as normal as possible to not urinate on self                                PT Long Term Goals - 12/20/14 1541    PT LONG TERM GOAL #1   Title Pt will report balancing out her bladder irritant: wter ratio from 3:3 to 2:4  in order to participate more community events with decreased  frequency to urinate.   Time 12   Period Weeks   Status Achieved   PT LONG TERM GOAL #2   Title  Pt will decrease score on PFDI  from  53  %  to   < 40   % in order to improve QOL.  (11/16/14: 0%)    Time 12   Period Weeks   Status Achieved   PT LONG TERM GOAL #3   Title Pt will demo proper deep core mm techniques throughout 5 fitness exercises in order minimize worsening of Sx and promote wellness.   Time 12   Period Weeks   Status Achieved   PT LONG TERM GOAL #4   Title Pt will increase pelvic floor endurance from 4/10/6/6 to 07/31/08/10 without pillow propped under hips in order to improve urinary Sx and decrease use of pads.   Time 12   Period Weeks   Status Achieved   PT LONG TERM GOAL #5   Title Pt will report a decreased use of pads to < 3 pads / day in order to improve QOL and save costs.    Time 12   Period Weeks   Status Achieved   PT LONG TERM GOAL #6   Title Pt will demo increased eversion strength on  R ankle from 4/5 to 5/5 in order to minimize risk for injuries when running.   Time 12   Period Weeks   Status Unable to assess               Problem List Patient Active Problem List   Diagnosis Date Noted  . Dyslipidemia 11/02/2014  . Blood in the urine 11/02/2014  . Migraine without aura and responsive to treatment 11/02/2014  . Overweight 11/02/2014  . Vitamin D deficiency 11/02/2014  . Urinary incontinence in female 11/02/2014    Jerl Mina ,PT, DPT, E-RYT  12/20/2014, 3:49 PM  [place] MAIN [facility] SERVICES [address] [place], [place], [address] Phone: [phone]   Fax:  [phone]

## 2015-03-16 ENCOUNTER — Telehealth: Payer: Self-pay | Admitting: Urology

## 2015-03-16 NOTE — Telephone Encounter (Signed)
Patient was to have her UA rechecked for microscopic hematuria in May.  I don't see where that has been done.  Would you ask the patient to stop by the office for a recheck on her UA?

## 2015-03-21 NOTE — Telephone Encounter (Signed)
Spoke with pt in reference to u/a. Pt will stop by tomorrow for a u/a.

## 2015-03-22 ENCOUNTER — Other Ambulatory Visit: Payer: Self-pay

## 2015-05-11 ENCOUNTER — Emergency Department: Payer: 59

## 2015-05-11 ENCOUNTER — Encounter: Payer: Self-pay | Admitting: Emergency Medicine

## 2015-05-11 ENCOUNTER — Emergency Department
Admission: EM | Admit: 2015-05-11 | Discharge: 2015-05-12 | Disposition: A | Discharge status: Home / Self Care | Payer: 59 | Attending: Emergency Medicine | Admitting: Emergency Medicine

## 2015-05-11 DIAGNOSIS — Z7952 Long term (current) use of systemic steroids: Secondary | ICD-10-CM | POA: Diagnosis not present

## 2015-05-11 DIAGNOSIS — N760 Acute vaginitis: Secondary | ICD-10-CM | POA: Insufficient documentation

## 2015-05-11 DIAGNOSIS — Z79899 Other long term (current) drug therapy: Secondary | ICD-10-CM | POA: Diagnosis not present

## 2015-05-11 DIAGNOSIS — R1031 Right lower quadrant pain: Secondary | ICD-10-CM | POA: Diagnosis present

## 2015-05-11 DIAGNOSIS — B9689 Other specified bacterial agents as the cause of diseases classified elsewhere: Secondary | ICD-10-CM

## 2015-05-11 LAB — CBC
HEMATOCRIT: 40 % (ref 35.0–47.0)
HEMOGLOBIN: 13.5 g/dL (ref 12.0–16.0)
MCH: 29.3 pg (ref 26.0–34.0)
MCHC: 33.8 g/dL (ref 32.0–36.0)
MCV: 86.8 fL (ref 80.0–100.0)
Platelets: 317 10*3/uL (ref 150–440)
RBC: 4.61 MIL/uL (ref 3.80–5.20)
RDW: 13.4 % (ref 11.5–14.5)
WBC: 4.8 10*3/uL (ref 3.6–11.0)

## 2015-05-11 LAB — URINALYSIS COMPLETE WITH MICROSCOPIC (ARMC ONLY)
BACTERIA UA: NONE SEEN
Bilirubin Urine: NEGATIVE
Glucose, UA: NEGATIVE mg/dL
Ketones, ur: NEGATIVE mg/dL
LEUKOCYTES UA: NEGATIVE
NITRITE: NEGATIVE
PROTEIN: NEGATIVE mg/dL
SPECIFIC GRAVITY, URINE: 1.027 (ref 1.005–1.030)
pH: 6 (ref 5.0–8.0)

## 2015-05-11 LAB — WET PREP, GENITAL
Clue Cells Wet Prep HPF POC: POSITIVE — AB
Sperm: NONE SEEN
Trich, Wet Prep: NONE SEEN
YEAST WET PREP: NONE SEEN

## 2015-05-11 LAB — COMPREHENSIVE METABOLIC PANEL
ALBUMIN: 4.2 g/dL (ref 3.5–5.0)
ALT: 7 U/L — ABNORMAL LOW (ref 14–54)
ANION GAP: 4 — AB (ref 5–15)
AST: 11 U/L — AB (ref 15–41)
Alkaline Phosphatase: 46 U/L (ref 38–126)
BILIRUBIN TOTAL: 0.5 mg/dL (ref 0.3–1.2)
BUN: 8 mg/dL (ref 6–20)
CHLORIDE: 105 mmol/L (ref 101–111)
CO2: 30 mmol/L (ref 22–32)
Calcium: 9.1 mg/dL (ref 8.9–10.3)
Creatinine, Ser: 0.98 mg/dL (ref 0.44–1.00)
GFR calc Af Amer: >60 mL/min (ref >60)
GFR calc non Af Amer: >60 mL/min (ref >60)
GLUCOSE: 94 mg/dL (ref 65–99)
POTASSIUM: 3.8 mmol/L (ref 3.5–5.1)
SODIUM: 139 mmol/L (ref 135–145)
TOTAL PROTEIN: 7.1 g/dL (ref 6.5–8.1)

## 2015-05-11 LAB — LIPASE, BLOOD: Lipase: 25 U/L (ref 11–51)

## 2015-05-11 MED ORDER — METRONIDAZOLE 500 MG PO TABS: 500.0000 mg | ORAL_TABLET | Freq: Two times a day (BID) | ORAL | Status: DC

## 2015-05-11 MED ORDER — IOHEXOL 240 MG/ML SOLN
25.0000 mL | Freq: Once | INTRAMUSCULAR | Status: AC | PRN
  Administered 2015-05-11: 25 mL via ORAL

## 2015-05-11 MED ORDER — IBUPROFEN 800 MG PO TABS
800.0000 mg | ORAL_TABLET | Freq: Once | ORAL | Status: AC
  Administered 2015-05-11: 800 mg via ORAL
  Filled 2015-05-11: qty 1

## 2015-05-11 MED ORDER — METRONIDAZOLE 500 MG PO TABS
500.0000 mg | ORAL_TABLET | Freq: Once | ORAL | Status: AC
  Administered 2015-05-11: 500 mg via ORAL
  Filled 2015-05-11: qty 1

## 2015-05-11 MED ORDER — IOHEXOL 300 MG/ML  SOLN
100.0000 mL | Freq: Once | INTRAMUSCULAR | Status: AC | PRN
  Administered 2015-05-11: 100 mL via INTRAVENOUS

## 2015-05-11 NOTE — ED Notes (Signed)
Patient transported to CT 

## 2015-05-11 NOTE — ED Notes (Signed)
Pt reports Right Lower Quadrant pain x approximately 1 week.  Pt reports pain is getting more frequent.  Denies nausea/vomiting/diarrhea.  Pt has taken ibuprofen and it helps, but the pain returns.

## 2015-05-11 NOTE — ED Notes (Signed)
Received report from [facility]

## 2015-05-11 NOTE — Discharge Instructions (Signed)
You were evaluated for right lower quadrant pain and although no certain cause was found, your exam and evaluation are reassuring in the emergency department tonight.  Continue to treat with ibuprofen over-the-counter 800 mg every 8 hours as needed for pain. Follow up with your primary care physician within one week.   Abdominal Pain, Adult Many things can cause belly (abdominal) pain. Most times, the belly pain is not dangerous. Many cases of belly pain can be watched and treated at home. HOME CARE   Do not take medicines that help you go poop (laxatives) unless told to by your doctor.  Only take medicine as told by your doctor.  Eat or drink as told by your doctor. Your doctor will tell you if you should be on a special diet. GET HELP IF:  You do not know what is causing your belly pain.  You have belly pain while you are sick to your stomach (nauseous) or have runny poop (diarrhea).  You have pain while you pee or poop.  Your belly pain wakes you up at night.  You have belly pain that gets worse or better when you eat.  You have belly pain that gets worse when you eat fatty foods.  You have a fever. GET HELP RIGHT AWAY IF:   The pain does not go away within 2 hours.  You keep throwing up (vomiting).  The pain changes and is only in the right or left part of the belly.  You have bloody or tarry looking poop. MAKE SURE YOU:   Understand these instructions.  Will watch your condition.  Will get help right away if you are not doing well or get worse.   This information is not intended to replace advice given to you by your health care provider. Make sure you discuss any questions you have with your health care provider.   Document Released: 09/26/2007 Document Revised: 04/30/2014 Document Reviewed: 12/17/2012 Elsevier Interactive Patient Education 2016 Elsevier Inc.  Bacterial Vaginosis Bacterial vaginosis is a vaginal infection that occurs when the normal balance  of bacteria in the vagina is disrupted. It results from an overgrowth of certain bacteria. This is the most common vaginal infection in women of childbearing age. Treatment is important to prevent complications, especially in pregnant women, as it can cause a premature delivery. CAUSES  Bacterial vaginosis is caused by an increase in harmful bacteria that are normally present in smaller amounts in the vagina. Several different kinds of bacteria can cause bacterial vaginosis. However, the reason that the condition develops is not fully understood. RISK FACTORS Certain activities or behaviors can put you at an increased risk of developing bacterial vaginosis, including:  Having a new sex partner or multiple sex partners.  Douching.  Using an intrauterine device (IUD) for contraception. Women do not get bacterial vaginosis from toilet seats, bedding, swimming pools, or contact with objects around them. SIGNS AND SYMPTOMS  Some women with bacterial vaginosis have no signs or symptoms. Common symptoms include:  Grey vaginal discharge.  A fishlike odor with discharge, especially after sexual intercourse.  Itching or burning of the vagina and vulva.  Burning or pain with urination. DIAGNOSIS  Your health care provider will take a medical history and examine the vagina for signs of bacterial vaginosis. A sample of vaginal fluid may be taken. Your health care provider will look at this sample under a microscope to check for bacteria and abnormal cells. A vaginal pH test may also be done.  TREATMENT  Bacterial vaginosis may be treated with antibiotic medicines. These may be given in the form of a pill or a vaginal cream. A second round of antibiotics may be prescribed if the condition comes back after treatment. Because bacterial vaginosis increases your risk for sexually transmitted diseases, getting treated can help reduce your risk for chlamydia, gonorrhea, HIV, and herpes. HOME CARE INSTRUCTIONS     Only take over-the-counter or prescription medicines as directed by your health care provider.  If antibiotic medicine was prescribed, take it as directed. Make sure you finish it even if you start to feel better.  Tell all sexual partners that you have a vaginal infection. They should see their health care provider and be treated if they have problems, such as a mild rash or itching.  During treatment, it is important that you follow these instructions:  Avoid sexual activity or use condoms correctly.  Do not douche.  Avoid alcohol as directed by your health care provider.  Avoid breastfeeding as directed by your health care provider. SEEK MEDICAL CARE IF:   Your symptoms are not improving after 3 days of treatment.  You have increased discharge or pain.  You have a fever. MAKE SURE YOU:   Understand these instructions.  Will watch your condition.  Will get help right away if you are not doing well or get worse. FOR MORE INFORMATION  Centers for Disease Control and Prevention, Division of STD Prevention: [employer]raud[employer] American Sexual Health Association (ASHA): www.ashastd.org    This information is not intended to replace advice given to you by your health care provider. Make sure you discuss any questions you have with your health care provider.   Document Released: 04/09/2005 Document Revised: 04/30/2014 Document Reviewed: 11/19/2012 Elsevier Interactive Patient Education [employer].

## 2015-05-11 NOTE — ED Provider Notes (Signed)
[facility] Emergency Department Provider Note   ____________________________________________  Time seen: Approximately 9 PM I have reviewed the triage vital signs and the triage nursing note.  HISTORY  Chief Complaint Abdominal Pain   Historian Patient  HPI Tanya Robinson is a 40 y.o. female is here for right lower quadrant pain. Patient states that she's had on and off right lower abdominal pain for about a week. She has had a hysterectomy. She had been taking ibuprofen and it seemed to go away, but today was a little bit worse. Pain tends to come on in the evenings. No nausea vomiting or diarrhea. No fevers. No black or bloody stools. Pain is 5 out of 10. No exacerbating or alleviating factors.    Past Medical History  Diagnosis Date  . Anxiety   . Headache     Patient Active Problem List   Diagnosis Date Noted  . Dyslipidemia 11/02/2014  . Blood in the urine 11/02/2014  . Migraine without aura and responsive to treatment 11/02/2014  . Overweight 11/02/2014  . Vitamin D deficiency 11/02/2014  . Urinary incontinence in female 11/02/2014    Past Surgical History  Procedure Laterality Date  . Abdominal hysterectomy    . Tubal ligation  2009    Current Outpatient Rx  Name  Route  Sig  Dispense  Refill  . baclofen (LIORESAL) 20 MG tablet   Oral   Take 1 tablet (20 mg total) by mouth 3 (three) times daily. Prn migraine   30 each   0   . EPINEPHRINE, ANAPHYLAXIS THERAPY AGENTS,      EPIPEN 2-PAK, 0.3MG/0.3ML (Injection Device)  1 Device use as directed for 30 days  Quantity: 2;  Refills: 1   Ordered :30-Jun-2012  Steele Sizer MD;  Started 30-Jun-2012 Active         . metroNIDAZOLE (FLAGYL) 500 MG tablet   Oral   Take 1 tablet (500 mg total) by mouth 2 (two) times daily.   20 tablet   0   . predniSONE (DELTASONE) 20 MG tablet   Oral   Take 1 tablet (20 mg total) by mouth 2 (two) times daily with a meal. PRN - two doses per  episode of migraine   4 tablet   0   . solifenacin (VESICARE) 10 MG tablet   Oral   Take by mouth daily.         . traMADol (ULTRAM) 50 MG tablet   Oral   Take 1 tablet (50 mg total) by mouth every 8 (eight) hours as needed (migraine).   10 tablet   0     Allergies Tea and Bee venom  Family History  Problem Relation Age of Onset  . Hypertension Mother   . Hyperlipidemia Mother   . Diabetes Father     Social History Social History  Substance Use Topics  . Smoking status: Never Smoker   . Smokeless tobacco: Never Used  . Alcohol Use: No    Review of Systems  Constitutional: Negative for fever. Eyes: Negative for visual changes. ENT: Negative for sore throat. Cardiovascular: Negative for chest pain. Respiratory: Negative for shortness of breath. Gastrointestinal: Negative forvomiting and diarrhea. Genitourinary: Negative for dysuria. Musculoskeletal: Negative for back pain. Skin: Negative for rash. Neurological: Negative for headache. 10 point Review of Systems otherwise negative ____________________________________________   PHYSICAL EXAM:  VITAL SIGNS: ED Triage Vitals  Enc Vitals Group     BP 05/11/15 1743 148/80 mmHg  Pulse Rate 05/11/15 1743 62     Resp 05/11/15 1743 18     Temp 05/11/15 1743 98.1 F (36.7 C)     Temp Source 05/11/15 1743 Oral     SpO2 05/11/15 1743 100 %     Weight 05/11/15 1743 154 lb (69.854 kg)     Height 05/11/15 1743 5' 5"  (1.651 m)     Head Cir --      Peak Flow --      Pain Score 05/11/15 1743 6     Pain Loc --      Pain Edu? --      Excl. in [place]? --      Constitutional: Alert and oriented. Well appearing and in no distress. Eyes: Conjunctivae are normal. PERRL. Normal extraocular movements. ENT   Head: Normocephalic and atraumatic.   Nose: No congestion/rhinnorhea.   Mouth/Throat: Mucous membranes are moist.   Neck: No stridor. Cardiovascular/Chest: Normal rate, regular rhythm.  No murmurs,  rubs, or gallops. Respiratory: Normal respiratory effort without tachypnea nor retractions. Breath sounds are clear and equal bilaterally. No wheezes/rales/rhonchi. Gastrointestinal: Soft. No distention, no guarding, no rebound. Moderate tenderness to palpation in right lower quadrant  Genitourinary/rectal: Mild vaginal discharge. No cervical motion tenderness. No adnexal mass, but adnexal tenderness to the right lower quadrant on bimanual exam. Musculoskeletal: Nontender with normal range of motion in all extremities. No joint effusions.  No lower extremity tenderness.  No edema. Neurologic:  Normal speech and language. No gross or focal neurologic deficits are appreciated. Skin:  Skin is warm, dry and intact. No rash noted. Psychiatric: Mood and affect are normal. Speech and behavior are normal. Patient exhibits appropriate insight and judgment.  ____________________________________________   EKG I, Lisa Roca, MD, the attending physician have personally viewed and interpreted all ECGs.  None ____________________________________________  LABS (pertinent positives/negatives)  White prep positive for clue cells and white blood cells Lipase 25 , Intensive metabolic panel without significant abnormality White blood count 4.8, hemoglobin 13.5 and platelet count 317 Urinalysis 6-30 red blood cells, 0-5 white blood cells, negative for leukocytes, no bacteria, negative for nitrites  ____________________________________________  RADIOLOGY All Xrays were viewed by me. Imaging interpreted by Radiologist.  Ultrasound pelvic and transvaginal with Doppler:IMPRESSION: No evidence for ovarian torsion. Status post hysterectomy. Unremarkable pelvic ultrasound.  CT abdomen and pelvis with contrast: Pending __________________________________________  PROCEDURES  Procedure(s) performed: None  Critical Care performed: None  ____________________________________________   ED COURSE /  ASSESSMENT AND PLAN  Pertinent labs & imaging results that were available during my care of the patient were reviewed by me and considered in my medical decision making (see chart for details).    Patient is here with complaint of right lower quadrant pain. Seems somewhat less likely to be appendicitis given the one week waxing and waning nature and without fever, or early white blood cell count, and so I discussed with the patient obtaining ultrasound to evaluate the ovary.  Ultrasound shows no abnormality in the right lower quadrant. Given that she continues to be in significant amount pain, I did discuss with her obtaining CT of the abdomen.  Patient care transferred to Dr. Karma Greaser and to room 19  at shift change 11 PM. CT of the abdomen is pending. Anticipate disposition home if CT of the abdomen and pelvis are negative, with my instructions for abdominal pain and bacterial vaginosis with prescription for flagyl.  CONSULTATIONS:   None   Patient / Family / Caregiver informed of clinical  course, medical decision-making process, and agree with plan.  ___________________________________________   FINAL CLINICAL IMPRESSION(S) / ED DIAGNOSES   Final diagnoses:  Right lower quadrant pain  Bacterial vaginosis              Note: This dictation was prepared with Dragon dictation. Any transcriptional errors that result from this process are unintentional   Lisa Roca, MD 05/11/15 [phone]

## 2015-05-12 LAB — CHLAMYDIA/NGC RT PCR (ARMC ONLY)
Chlamydia Tr: NOT DETECTED
N gonorrhoeae: NOT DETECTED

## 2015-05-12 NOTE — ED Provider Notes (Addendum)
-----------------------------------------   11:10 PM on 05/11/2015 -----------------------------------------   Blood pressure 125/89, pulse 56, temperature 98.1 F (36.7 C), temperature source Oral, resp. rate 18, height 5' 5"  (1.651 m), weight 69.854 kg, SpO2 100 %.  Assuming care from Dr. Reita Cliche.  In short, Tanya Robinson is a 40 y.o. female with a chief complaint of Abdominal Pain .  Refer to the original H&P for additional details.  The current plan of care is to follow-up CT scan and disposition appropriately.Marland Kitchen  ----------------------------------------- 12:13 AM on 05/12/2015 -----------------------------------------  CT scan is unremarkable.  I will discharge as per Dr. Alinda Money recommendations and with her discharge instructions and prescription(s).  [place] still pending, will be follow by culture nurse / pharmacy.    Ct Abdomen Pelvis W Contrast  05/11/2015  CLINICAL DATA:  Acute onset of right lower quadrant abdominal pain. Initial encounter. EXAM: CT ABDOMEN AND PELVIS WITH CONTRAST TECHNIQUE: Multidetector CT imaging of the abdomen and pelvis was performed using the standard protocol following bolus administration of intravenous contrast. CONTRAST:  135m OMNIPAQUE IOHEXOL 300 MG/ML  SOLN COMPARISON:  Pelvic ultrasound performed earlier today at 9:44 p.m. FINDINGS: The visualized lung bases are clear. Several nonspecific hypodensities are seen within the liver, measuring up to 1.3 cm in size. The spleen is unremarkable in appearance. The gallbladder is within normal limits. The pancreas and adrenal glands are unremarkable. The kidneys are unremarkable in appearance. There is no evidence of hydronephrosis. No renal or ureteral stones are seen. No perinephric stranding is appreciated. No free fluid is identified. The small bowel is unremarkable in appearance. The stomach is within normal limits. No acute vascular abnormalities are seen. The appendix is noted on coronal images and is  unremarkable in appearance, without evidence for appendicitis. The colon is unremarkable in appearance. The bladder is mildly distended and grossly unremarkable. The vaginal cuff is somewhat prominent, but likely within normal limits, status post hysterectomy. No suspicious adnexal masses are seen. The ovaries appear grossly symmetric. No inguinal lymphadenopathy is seen. No acute osseous abnormalities are identified. IMPRESSION: 1. No acute abnormality seen within the abdomen or pelvis. 2. Several nonspecific hypodensities within the liver, measuring up to 1.3 cm in size. Given normal LFTs and the patient's demographics, these are likely benign. Electronically Signed   By: JGarald BaldingM.D.   On: 05/11/2015 23:59      CHinda Kehr MD 05/12/15 [phone]

## 2015-07-14 ENCOUNTER — Encounter: Payer: 59 | Admitting: Family Medicine

## 2015-07-28 ENCOUNTER — Ambulatory Visit (INDEPENDENT_AMBULATORY_CARE_PROVIDER_SITE_OTHER): Payer: 59 | Admitting: Family Medicine

## 2015-07-28 ENCOUNTER — Encounter: Payer: Self-pay | Admitting: Family Medicine

## 2015-07-28 VITALS — BP 122/80 | HR 69 | Temp 98.2°F | Resp 14 | Wt 159.9 lb

## 2015-07-28 DIAGNOSIS — Z0000 Encounter for general adult medical examination without abnormal findings: Secondary | ICD-10-CM

## 2015-07-28 DIAGNOSIS — E785 Hyperlipidemia, unspecified: Secondary | ICD-10-CM | POA: Diagnosis not present

## 2015-07-28 DIAGNOSIS — R32 Unspecified urinary incontinence: Secondary | ICD-10-CM

## 2015-07-28 DIAGNOSIS — Z1239 Encounter for other screening for malignant neoplasm of breast: Secondary | ICD-10-CM | POA: Diagnosis not present

## 2015-07-28 DIAGNOSIS — E559 Vitamin D deficiency, unspecified: Secondary | ICD-10-CM

## 2015-07-28 DIAGNOSIS — Z91038 Other insect allergy status: Secondary | ICD-10-CM

## 2015-07-28 DIAGNOSIS — Z124 Encounter for screening for malignant neoplasm of cervix: Secondary | ICD-10-CM

## 2015-07-28 DIAGNOSIS — G43001 Migraine without aura, not intractable, with status migrainosus: Secondary | ICD-10-CM | POA: Diagnosis not present

## 2015-07-28 DIAGNOSIS — Z91030 Bee allergy status: Secondary | ICD-10-CM

## 2015-07-28 DIAGNOSIS — Z114 Encounter for screening for human immunodeficiency virus [HIV]: Secondary | ICD-10-CM

## 2015-07-28 DIAGNOSIS — Z01419 Encounter for gynecological examination (general) (routine) without abnormal findings: Secondary | ICD-10-CM

## 2015-07-28 MED ORDER — EPINEPHRINE 0.3 MG/0.3ML IJ SOAJ: 0.3000 mg | Freq: Once | INTRAMUSCULAR | Status: DC

## 2015-07-28 NOTE — Progress Notes (Signed)
Name: Tanya Robinson   MRN: [id number]    DOB: 05/30/1975   Date:07/28/2015       Progress Note  Subjective  Chief Complaint  Chief Complaint  Patient presents with  . Annual Exam  . Orders    mammogram    HPI  Well Woman: same sexual partner past 6 years, no vaginal discharge, s/p hysterectomy for treatment of bleeding, no breast problems, she states she had PT for bladder incontinence and is doing well now.   Bee Sting allergy and Tea allergy: needs refill of Epipen  Migraine: she states last episode was months ago, she states no longer needs rx medication. Taking otc medication prn. Episodes at most once a month, pain is usually frontal, throbbing like, not associated with nausea or vomiting, lasting less than one day.   Dyslipidemia: she lost 10 lbs since last visit, she has been eating healthier and is walking daily for 30  Minutes  Urinary incontinence: no longer a problems , seen by Urologist and also PT and is not having any issues since  Left flank pain: had symptoms a couple of months ago, went to [facility], had labs that were normal, likely muscular skeletal and resolved .   Patient Active Problem List   Diagnosis Date Noted  . Bee sting allergy 07/28/2015  . Dyslipidemia 11/02/2014  . Migraine without aura and responsive to treatment 11/02/2014  . Overweight 11/02/2014  . Vitamin D deficiency 11/02/2014  . Urinary incontinence in female 11/02/2014    Past Surgical History  Procedure Laterality Date  . Abdominal hysterectomy    . Tubal ligation  2009    Family History  Problem Relation Age of Onset  . Hypertension Mother   . Hyperlipidemia Mother   . Diabetes Father     Social History   Social History  . Marital Status: Married    Spouse Name: N/A  . Number of Children: N/A  . Years of Education: N/A   Occupational History  . Not on file.   Social History Main Topics  . Smoking status: Never Smoker   . Smokeless tobacco: Never Used  . Alcohol Use: No   . Drug Use: No  . Sexual Activity: Yes    Birth Control/ Protection: Surgical   Other Topics Concern  . Not on file   Social History Narrative     Current outpatient prescriptions:  .  EPINEPHrine (EPIPEN 2-PAK) 0.3 mg/0.3 mL IJ SOAJ injection, Inject 0.3 mLs (0.3 mg total) into the muscle once., Disp: 2 Device, Rfl: 0  Allergies  Allergen Reactions  . Tea Hives  . Bee Venom     Bee     ROS  Constitutional: Negative for fever , positive for  weight change.  Respiratory: Negative for cough and shortness of breath.   Cardiovascular: Negative for chest pain or palpitations.  Gastrointestinal: Negative for abdominal pain, no bowel changes.  Musculoskeletal: Negative for gait problem or joint swelling.  Skin: Negative for rash.  Neurological: Negative for dizziness , positive for intermittent  headache.  No other specific complaints in a complete review of systems (except as listed in HPI above).  Objective  Filed Vitals:   07/28/15 1408  BP: 122/80  Pulse: 69  Temp: 98.2 F (36.8 C)  TempSrc: Oral  Resp: 14  Weight: 159 lb 14.4 oz (72.53 kg)  SpO2: 96%    Body mass index is 26.61 kg/(m^2).  Physical Exam  Constitutional: Patient appears well-developed and well-nourished. No distress.  HENT: Head: Normocephalic and atraumatic. Ears: B TMs ok, no erythema or effusion; Nose: Nose normal. Mouth/Throat: Oropharynx is clear and moist. No oropharyngeal exudate.  Eyes: Conjunctivae and EOM are normal. Pupils are equal, round, and reactive to light. No scleral icterus.  Neck: Normal range of motion. Neck supple. No JVD present. No thyromegaly present.  Cardiovascular: Normal rate, regular rhythm and normal heart sounds.  No murmur heard. No BLE edema. Pulmonary/Chest: Effort normal and breath sounds normal. No respiratory distress. Abdominal: Soft. Bowel sounds are normal, no distension. There is no tenderness. no masses Breast: no lumps or masses, no nipple discharge  or rashes FEMALE GENITALIA: External genitalia normal External urethra normal Vaginal vault normal without discharge or lesions Cervix normal with nabothian cyst Bimanual exam normal without masses RECTAL: not done Musculoskeletal: Normal range of motion, no joint effusions. No gross deformities Neurological: he is alert and oriented to person, place, and time. No cranial nerve deficit. Coordination, balance, strength, speech and gait are normal.  Skin: Skin is warm and dry. No rash noted. No erythema.  Psychiatric: Patient has a normal mood and affect. behavior is normal. Judgment and thought content normal.  Recent Results (from the past 2160 hour(s))  Lipase, blood     Status: None   Collection Time: 05/11/15  5:45 PM  Result Value Ref Range   Lipase 25 11 - 51 U/L  Comprehensive metabolic panel     Status: Abnormal   Collection Time: 05/11/15  5:45 PM  Result Value Ref Range   Sodium 139 135 - 145 mmol/L   Potassium 3.8 3.5 - 5.1 mmol/L   Chloride 105 101 - 111 mmol/L   CO2 30 22 - 32 mmol/L   Glucose, Bld 94 65 - 99 mg/dL   BUN 8 6 - 20 mg/dL   Creatinine, Ser 0.98 0.44 - 1.00 mg/dL   Calcium 9.1 8.9 - 10.3 mg/dL   Total Protein 7.1 6.5 - 8.1 g/dL   Albumin 4.2 3.5 - 5.0 g/dL   AST 11 (L) 15 - 41 U/L   ALT 7 (L) 14 - 54 U/L   Alkaline Phosphatase 46 38 - 126 U/L   Total Bilirubin 0.5 0.3 - 1.2 mg/dL   GFR calc non Af Amer >60 >60 mL/min   GFR calc Af Amer >60 >60 mL/min    Comment: (NOTE) The eGFR has been calculated using the CKD EPI equation. This calculation has not been validated in all clinical situations. eGFR's persistently <60 mL/min signify possible Chronic Kidney Disease.    Anion gap 4 (L) 5 - 15  CBC     Status: None   Collection Time: 05/11/15  5:45 PM  Result Value Ref Range   WBC 4.8 3.6 - 11.0 K/uL   RBC 4.61 3.80 - 5.20 MIL/uL   Hemoglobin 13.5 12.0 - 16.0 g/dL   HCT 40.0 35.0 - 47.0 %   MCV 86.8 80.0 - 100.0 fL   MCH 29.3 26.0 - 34.0 pg    MCHC 33.8 32.0 - 36.0 g/dL   RDW 13.4 11.5 - 14.5 %   Platelets 317 150 - 440 K/uL  Urinalysis complete, with microscopic ([facility] only)     Status: Abnormal   Collection Time: 05/11/15  5:45 PM  Result Value Ref Range   Color, Urine YELLOW (A) YELLOW   APPearance CLEAR (A) CLEAR   Glucose, UA NEGATIVE NEGATIVE mg/dL   Bilirubin Urine NEGATIVE NEGATIVE   Ketones, ur NEGATIVE NEGATIVE mg/dL   Specific Gravity,  Urine 1.027 1.005 - 1.030   Hgb urine dipstick 2+ (A) NEGATIVE   pH 6.0 5.0 - 8.0   Protein, ur NEGATIVE NEGATIVE mg/dL   Nitrite NEGATIVE NEGATIVE   Leukocytes, UA NEGATIVE NEGATIVE   RBC / HPF 6-30 0 - 5 RBC/hpf   WBC, UA 0-5 0 - 5 WBC/hpf   Bacteria, UA NONE SEEN NONE SEEN   Squamous Epithelial / LPF 0-5 (A) NONE SEEN   Mucous PRESENT   Chlamydia/NGC rt PCR     Status: None   Collection Time: 05/11/15  9:02 PM  Result Value Ref Range   Specimen source GC/Chlam WET PREP    Chlamydia Tr NOT DETECTED NOT DETECTED   N gonorrhoeae NOT DETECTED NOT DETECTED    Comment: (NOTE) 100  This methodology has not been evaluated in pregnant women or in 200  patients with a history of hysterectomy. 300 400  This methodology will not be performed on patients less than 24  years of age.   Wet prep, genital     Status: Abnormal   Collection Time: 05/11/15  9:02 PM  Result Value Ref Range   Yeast Wet Prep HPF POC NONE SEEN NONE SEEN   Trich, Wet Prep NONE SEEN NONE SEEN   Clue Cells Wet Prep HPF POC POSITIVE (A) NONE SEEN   WBC, Wet Prep HPF POC FEW (A) NONE SEEN   Sperm NONE SEEN     PHQ2/9: Depression screen [facility] 2/9 07/28/2015 11/02/2014  Decreased Interest 0 0  Down, Depressed, Hopeless 0 0  PHQ - 2 Score 0 0    Fall Risk: Fall Risk  07/28/2015 11/02/2014  Falls in the past year? No No    Functional Status Survey: Is the patient deaf or have difficulty hearing?: No Does the patient have difficulty seeing, even when wearing glasses/contacts?: No Does the patient have  difficulty concentrating, remembering, or making decisions?: No Does the patient have difficulty walking or climbing stairs?: No Does the patient have difficulty dressing or bathing?: No Does the patient have difficulty doing errands alone such as visiting a doctor's office or shopping?: No    Assessment & Plan  1. Well woman exam  Discussed importance of 150 minutes of physical activity weekly, eat two servings of fish weekly, eat one serving of tree nuts ( cashews, pistachios, pecans, almonds.Marland Kitchen) every other day, eat 6 servings of fruit/vegetables daily and drink plenty of water and avoid sweet beverages.   2. Breast cancer screening  - MM Digital Screening; Future  3. Dyslipidemia  - Lipid panel  4. Vitamin D deficiency  - VITAMIN D 25 Hydroxy (Vit-D Deficiency, Fractures)  5. Cervical cancer screening  Pap smear and HPV  6. Bee sting allergy  - EPINEPHrine (EPIPEN 2-PAK) 0.3 mg/0.3 mL IJ SOAJ injection; Inject 0.3 mLs (0.3 mg total) into the muscle once.  Dispense: 2 Device; Refill: 0  7. Encounter for screening for HIV  - HIV antibody  9. Migraine without aura and with status migrainosus, not intractable  Doing well on otc medication prn

## 2015-08-05 LAB — PAPLB, HPV, RFX16/18
HPV GENOTYPE, 16: NEGATIVE
HPV Genotype, 18: NEGATIVE
HPV, HIGH-RISK: POSITIVE — AB
PAP SMEAR COMMENT: 0

## 2016-02-07 ENCOUNTER — Encounter: Payer: Self-pay | Admitting: Family Medicine

## 2016-02-07 ENCOUNTER — Ambulatory Visit (INDEPENDENT_AMBULATORY_CARE_PROVIDER_SITE_OTHER): Payer: 59 | Admitting: Family Medicine

## 2016-02-07 ENCOUNTER — Other Ambulatory Visit: Payer: Self-pay

## 2016-02-07 VITALS — BP 124/76 | HR 82 | Temp 98.8°F | Ht 65.0 in | Wt 157.1 lb

## 2016-02-07 DIAGNOSIS — R5383 Other fatigue: Secondary | ICD-10-CM | POA: Diagnosis not present

## 2016-02-07 DIAGNOSIS — F411 Generalized anxiety disorder: Secondary | ICD-10-CM

## 2016-02-07 DIAGNOSIS — E785 Hyperlipidemia, unspecified: Secondary | ICD-10-CM

## 2016-02-07 DIAGNOSIS — Z23 Encounter for immunization: Secondary | ICD-10-CM

## 2016-02-07 DIAGNOSIS — Z131 Encounter for screening for diabetes mellitus: Secondary | ICD-10-CM | POA: Diagnosis not present

## 2016-02-07 DIAGNOSIS — G43001 Migraine without aura, not intractable, with status migrainosus: Secondary | ICD-10-CM | POA: Diagnosis not present

## 2016-02-07 DIAGNOSIS — E559 Vitamin D deficiency, unspecified: Secondary | ICD-10-CM | POA: Diagnosis not present

## 2016-02-07 MED ORDER — ESCITALOPRAM OXALATE 10 MG PO TABS: 10.0000 mg | ORAL_TABLET | Freq: Every day | ORAL | 0 refills | Status: DC

## 2016-02-07 MED ORDER — AMITRIPTYLINE HCL 25 MG PO TABS: 25.0000 mg | ORAL_TABLET | Freq: Every day | ORAL | 2 refills | Status: DC

## 2016-02-07 NOTE — Progress Notes (Signed)
Name: Tanya Robinson   MRN: [id number]    DOB: 17-Mar-1976   Date:02/07/2016       Progress Note  Subjective  Chief Complaint  Chief Complaint  Patient presents with  . Migraine    HPI  Migraine Headaches: she has noticed that she has almost daily headaches for the past couple of weeks, she had to go home once because of severe photophobia, phonophobia, nausea and vomiting. Some of the headache are dull and aching. Taking Excedrin migraine multiple times a week. She is not drinking caffeine daily, she sleeps enough at night, no change in diet, but has been more stressed at work.   GAD: she has been very stressed at work, she has been on the same job for the past 20 years and gets frustrated with her co-workers. She states also different management six months ago and she feels like she is being watched all the time. She has been having crying spells, more snappy than usual.   Dyslipidemia: LDL was high but normal HDL, she is not following a low fat diet.   Patient Active Problem List   Diagnosis Date Noted  . Bee sting allergy 07/28/2015  . Dyslipidemia 11/02/2014  . Migraine without aura and responsive to treatment 11/02/2014  . Overweight 11/02/2014  . Vitamin D deficiency 11/02/2014  . Urinary incontinence in female 11/02/2014    Past Surgical History:  Procedure Laterality Date  . ABDOMINAL HYSTERECTOMY    . TUBAL LIGATION  2009    Family History  Problem Relation Age of Onset  . Hypertension Mother   . Hyperlipidemia Mother   . Diabetes Father     Social History   Social History  . Marital status: Married    Spouse name: N/A  . Number of children: N/A  . Years of education: N/A   Occupational History  . Not on file.   Social History Main Topics  . Smoking status: Never Smoker  . Smokeless tobacco: Never Used  . Alcohol use No  . Drug use: No  . Sexual activity: Yes    Birth control/ protection: Surgical   Other Topics Concern  . 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      Current Outpatient Prescriptions:  .  EPINEPHrine (EPIPEN 2-PAK) 0.3 mg/0.3 mL IJ SOAJ injection, Inject 0.3 mLs (0.3 mg total) into the muscle once., Disp: 2 Device, Rfl: 0 .  amitriptyline (ELAVIL) 25 MG tablet, Take 1 tablet (25 mg total) by mouth at bedtime., Disp: 30 tablet, Rfl: 2  Allergies  Allergen Reactions  . Tea Hives  . Bee Venom     Bee     ROS  Constitutional: Negative for fever or weight change.  Respiratory: Negative for cough and shortness of breath.   Cardiovascular: Negative for chest pain or palpitations.  Gastrointestinal: Negative for abdominal pain, no bowel changes.  Musculoskeletal: Negative for gait problem or joint swelling.  Skin: Negative for rash.  Neurological: Negative for dizziness, positive headache.  No other specific complaints in a complete review of systems (except as listed in HPI above).  Objective  Vitals:   02/07/16 0932  BP: 124/76  Pulse: 82  Temp: 98.8  F (37.1 C)  SpO2: 98%  Weight: 157 lb 1.6 oz (71.3 kg)  Height: 5' 5"  (1.651 m)    Body mass index is 26.14 kg/m.  Physical Exam  Constitutional: Patient appears well-developed and well-nourished. No distress.  HEENT: head atraumatic, normocephalic, pupils equal and reactive to light, neck supple, throat within normal limits Cardiovascular: Normal rate, regular rhythm and normal heart sounds.  No murmur heard. No BLE edema. Pulmonary/Chest: Effort normal and breath sounds normal. No respiratory distress. Abdominal: Soft.  There is no tenderness. Psychiatric: Patient has a normal mood and affect. behavior is normal. Judgment and thought content normal. Neurological : no focal findings, cranial nerves  intact   PHQ2/9: Depression screen [facility] 2/9 02/07/2016 07/28/2015 11/02/2014  Decreased Interest 0 0 0  Down, Depressed, Hopeless 0 0 0  PHQ - 2 Score 0 0 0     GAD 7 : Generalized Anxiety Score 02/07/2016  Nervous, Anxious, on Edge 2  Control/stop worrying 2  Worry too much - different things 3  Trouble relaxing 0  Restless 0  Easily annoyed or irritable 3  Afraid - awful might happen 2  Total GAD 7 Score 12  Anxiety Difficulty Somewhat difficult    Fall Risk: Fall Risk  02/07/2016 07/28/2015 11/02/2014  Falls in the past year? No No No    Functional Status Survey: Is the patient deaf or have difficulty hearing?: No Does the patient have difficulty seeing, even when wearing glasses/contacts?: Yes (glasses, contacts) Does the patient have difficulty concentrating, remembering, or making decisions?: No Does the patient have difficulty walking or climbing stairs?: No Does the patient have difficulty dressing or bathing?: No Does the patient have difficulty doing errands alone such as visiting a doctor's office or shopping?: No    Assessment & Plan  1. Migraine without aura and with status migrainosus, not intractable  - amitriptyline (ELAVIL) 25 MG tablet; Take 1 tablet (25 mg total) by mouth at bedtime.  Dispense: 30 tablet; Refill: 2  2. GAD (generalized anxiety disorder)  - TSH - CBC with Differential/Platelet - Comprehensive metabolic panel - escitalopram (LEXAPRO) 10 MG tablet; Take 1 tablet (10 mg total) by mouth daily.  Dispense: 30 tablet; Refill: 0  3. Dyslipidemia  - Lipid panel  4. Vitamin D deficiency  - VITAMIN D 25 Hydroxy (Vit-D Deficiency, Fractures)  5. Diabetes mellitus screening  - Hemoglobin A1c  6. Other fatigue  - Vitamin B12 - CBC with Differential/Platelet - Comprehensive metabolic panel   7. Needs flu shot  refused

## 2016-02-07 NOTE — Patient Instructions (Signed)
Download the ap [employer]

## 2016-02-07 NOTE — Addendum Note (Signed)
Addended by: Inda Coke on: 02/07/2016 10:18 AM   Modules accepted: Orders

## 2016-02-08 LAB — CBC WITH DIFFERENTIAL/PLATELET
Basophils Absolute: 0 10*3/uL (ref 0.0–0.2)
Basos: 1 %
EOS (ABSOLUTE): 0.1 10*3/uL (ref 0.0–0.4)
Eos: 2 %
Hematocrit: 42.5 % (ref 34.0–46.6)
Hemoglobin: 14.7 g/dL (ref 11.1–15.9)
IMMATURE GRANULOCYTES: 0 %
Immature Grans (Abs): 0 10*3/uL (ref 0.0–0.1)
Lymphocytes Absolute: 1.5 10*3/uL (ref 0.7–3.1)
Lymphs: 30 %
MCH: 30.1 pg (ref 26.6–33.0)
MCHC: 34.6 g/dL (ref 31.5–35.7)
MCV: 87 fL (ref 79–97)
MONOS ABS: 0.3 10*3/uL (ref 0.1–0.9)
Monocytes: 6 %
NEUTROS PCT: 61 %
Neutrophils Absolute: 3.1 10*3/uL (ref 1.4–7.0)
PLATELETS: 326 10*3/uL (ref 150–379)
RBC: 4.88 x10E6/uL (ref 3.77–5.28)
RDW: 13.9 % (ref 12.3–15.4)
WBC: 5 10*3/uL (ref 3.4–10.8)

## 2016-02-08 LAB — LIPID PANEL
Chol/HDL Ratio: 3.5 ratio units (ref 0.0–4.4)
Cholesterol, Total: 229 mg/dL — ABNORMAL HIGH (ref 100–199)
HDL: 65 mg/dL (ref >39)
LDL CALC: 149 mg/dL — AB (ref 0–99)
Triglycerides: 77 mg/dL (ref 0–149)
VLDL CHOLESTEROL CAL: 15 mg/dL (ref 5–40)

## 2016-02-08 LAB — COMPREHENSIVE METABOLIC PANEL
A/G RATIO: 1.8 (ref 1.2–2.2)
ALT: 7 IU/L (ref 0–32)
AST: 8 IU/L (ref 0–40)
Albumin: 4.6 g/dL (ref 3.5–5.5)
Alkaline Phosphatase: 47 IU/L (ref 39–117)
BUN/Creatinine Ratio: 6 — ABNORMAL LOW (ref 9–23)
BUN: 6 mg/dL (ref 6–24)
Bilirubin Total: 0.4 mg/dL (ref 0.0–1.2)
CALCIUM: 9.5 mg/dL (ref 8.7–10.2)
CO2: 26 mmol/L (ref 18–29)
Chloride: 102 mmol/L (ref 96–106)
Creatinine, Ser: 0.98 mg/dL (ref 0.57–1.00)
GFR calc Af Amer: 82 mL/min/{1.73_m2} (ref >59)
GFR, EST NON AFRICAN AMERICAN: 71 mL/min/{1.73_m2} (ref >59)
Globulin, Total: 2.5 g/dL (ref 1.5–4.5)
Glucose: 94 mg/dL (ref 65–99)
POTASSIUM: 4.3 mmol/L (ref 3.5–5.2)
Sodium: 141 mmol/L (ref 134–144)
Total Protein: 7.1 g/dL (ref 6.0–8.5)

## 2016-02-08 LAB — TSH: TSH: 1.35 u[IU]/mL (ref 0.450–4.500)

## 2016-02-08 LAB — VITAMIN D 25 HYDROXY (VIT D DEFICIENCY, FRACTURES): Vit D, 25-Hydroxy: 17 ng/mL — ABNORMAL LOW (ref 30.0–100.0)

## 2016-02-08 LAB — VITAMIN B12: Vitamin B-12: 492 pg/mL (ref 211–946)

## 2016-02-13 ENCOUNTER — Other Ambulatory Visit: Payer: Self-pay

## 2016-02-13 ENCOUNTER — Telehealth: Payer: Self-pay

## 2016-02-13 MED ORDER — VITAMIN D (ERGOCALCIFEROL) 1.25 MG (50000 UNIT) PO CAPS: 50000.0000 [IU] | ORAL_CAPSULE | ORAL | 0 refills | Status: DC

## 2016-02-13 NOTE — Telephone Encounter (Signed)
-----   Message from Steele Sizer, MD sent at 02/10/2016  8:25 AM EDT ----- TSH and B12 is normal  Vitamin D is low, I will send prescription vitamin D to  pharmacy and once finished she/he needs to take otc vitamin D 2000 units daily Sugar, kidney and transaminases are within normal limits Normal CBC Based on the results of lipid panel his/her cardiovascular risk factor ( using [facility] )  in the next 10 years is :0.5% normal

## 2016-02-13 NOTE — Telephone Encounter (Signed)
Left a message for patient to return my call regarding labs.

## 2016-02-13 NOTE — Progress Notes (Unsigned)
vi

## 2016-03-06 ENCOUNTER — Other Ambulatory Visit: Payer: Self-pay | Admitting: Family Medicine

## 2016-03-06 DIAGNOSIS — F411 Generalized anxiety disorder: Secondary | ICD-10-CM

## 2016-03-12 ENCOUNTER — Ambulatory Visit: Payer: 59 | Admitting: Family Medicine

## 2016-07-30 ENCOUNTER — Ambulatory Visit (INDEPENDENT_AMBULATORY_CARE_PROVIDER_SITE_OTHER): Payer: 59 | Admitting: Family Medicine

## 2016-07-30 ENCOUNTER — Other Ambulatory Visit: Payer: Self-pay | Admitting: Family Medicine

## 2016-07-30 ENCOUNTER — Encounter: Payer: Self-pay | Admitting: Family Medicine

## 2016-07-30 VITALS — BP 104/68 | HR 86 | Temp 98.0°F | Resp 16 | Ht 65.0 in | Wt 163.3 lb

## 2016-07-30 DIAGNOSIS — Z1231 Encounter for screening mammogram for malignant neoplasm of breast: Secondary | ICD-10-CM | POA: Diagnosis not present

## 2016-07-30 DIAGNOSIS — Z23 Encounter for immunization: Secondary | ICD-10-CM

## 2016-07-30 DIAGNOSIS — R87810 Cervical high risk human papillomavirus (HPV) DNA test positive: Secondary | ICD-10-CM

## 2016-07-30 DIAGNOSIS — Z01419 Encounter for gynecological examination (general) (routine) without abnormal findings: Secondary | ICD-10-CM

## 2016-07-30 DIAGNOSIS — Z1239 Encounter for other screening for malignant neoplasm of breast: Secondary | ICD-10-CM

## 2016-07-30 NOTE — Progress Notes (Signed)
Name: Tanya Robinson   MRN: [id number]    DOB: Jul 14, 1975   Date:07/30/2016       Progress Note  Subjective  Chief Complaint  Chief Complaint  Patient presents with  . Annual Exam    HPI  Well Woman: she stopped taking Lexapro because of side effects, but states not as stressed at this time, and does not want to take medications. Discussed mindfulness. She gained weight since last visit, she states she feels hungry all the time. She denies any bladder symptoms, she states she has been doing well since she had PT. She is sexually active, same partner for over 7 years, no vaginal discharge or pain during intercourse.    Patient Active Problem List   Diagnosis Date Noted  . 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 Bee sting allergy 07/28/2015  . Dyslipidemia 11/02/2014  . Migraine without aura and responsive to treatment 11/02/2014  . Overweight 11/02/2014  . Vitamin D deficiency 11/02/2014  . Urinary incontinence in female 11/02/2014    Past Surgical History:  Procedure Laterality Date  . ABDOMINAL HYSTERECTOMY    . TUBAL LIGATION  2009    Family History  Problem Relation Age of Onset  . Hypertension Mother   . Hyperlipidemia Mother   . Diabetes Father     Social History   Social History  . Marital status: Significant Other    Spouse name: [place]   . Number of children: 2  . Years of education: N/A   Occupational History  . collection analyst [employer]   Social History Main Topics  . Smoking status: Never Smoker  . Smokeless tobacco: Never Used  . Alcohol use No  . Drug use: No  . Sexual activity: Yes    Birth control/ protection: Surgical   Other Topics Concern  . 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    Her two children, one adult and  74 yo son is trying to go to [employer] soon, but daughter moved out in 2017      Current Outpatient Prescriptions:  .  cholecalciferol (VITAMIN D) 1000 units tablet, Take 2,000 Units by mouth., Disp: , Rfl:  .  amitriptyline (ELAVIL) 25 MG tablet, Take 1 tablet (25 mg total) by mouth at bedtime., Disp: 30 tablet, Rfl: 2 .  EPINEPHrine (EPIPEN 2-PAK) 0.3 mg/0.3 mL IJ SOAJ injection, Inject 0.3 mLs (0.3 mg total) into the muscle once., Disp: 2 Device, Rfl: 0  Allergies  Allergen Reactions  . Tea Hives  . Bee Venom     Bee     ROS  Constitutional: Negative for fever, positive for mild  weight change.  Respiratory: Negative for cough and shortness of breath.   Cardiovascular: Negative for chest pain or palpitations.  Gastrointestinal: Negative for abdominal pain, no bowel changes.  Musculoskeletal: Negative for gait problem or joint swelling.  Skin: Negative for rash.  Neurological: Negative for dizziness, positive for intermittent  headache.  No other specific complaints in a complete review of systems (except as listed in HPI above).  Objective  Vitals:   07/30/16 1403  BP: 104/68  Pulse: 86  Resp: 16  Temp: 98 F (36.7 C)  SpO2: 95%  Weight: 163 lb 5 oz (74.1 kg)  Height: 5' 5"  (1.651 m)    Body mass index is 27.18 kg/m.  Physical Exam  Constitutional: Patient appears well-developed and well-nourished. No distress.  HENT: Head: Normocephalic and atraumatic. Ears: B TMs ok, no erythema or effusion; Nose: Nose normal. Mouth/Throat: Oropharynx is clear and moist. No oropharyngeal exudate.  Eyes: Conjunctivae and EOM are normal. Pupils are equal, round, and reactive to light. No scleral icterus.  Neck: Normal range of motion. Neck supple. No JVD present. No thyromegaly present.  Cardiovascular: Normal rate, regular rhythm and normal heart sounds.  No murmur heard. No BLE edema. Pulmonary/Chest: Effort normal and breath sounds normal. No respiratory distress. Abdominal: Soft.  Bowel sounds are normal, no distension. There is no tenderness. no masses Breast: no lumps or masses, no nipple discharge or rashes FEMALE GENITALIA:  External genitalia normal External urethra normal Vaginal vault normal without discharge or lesions Cervix normal without discharge or lesions Bimanual exam normal without masses RECTAL: not done Musculoskeletal: Normal range of motion, no joint effusions. No gross deformities Neurological: he is alert and oriented to person, place, and time. No cranial nerve deficit. Coordination, balance, strength, speech and gait are normal.  Skin: Skin is warm and dry. No rash noted. No erythema.  Psychiatric: Patient has a normal mood and affect. behavior is normal. Judgment and thought content normal.  PHQ2/9: Depression screen [facility] 2/9 07/30/2016 02/07/2016 07/28/2015 11/02/2014  Decreased Interest 0 0 0 0  Down, Depressed, Hopeless 0 0 0 0  PHQ - 2 Score 0 0 0 0     Fall Risk: Fall Risk  07/30/2016 02/07/2016 07/28/2015 11/02/2014  Falls in the past year? No No No No    Functional Status Survey: Is the patient deaf or have difficulty hearing?: No Does the patient have difficulty seeing, even when wearing glasses/contacts?: No Does the patient have difficulty concentrating, remembering, or making decisions?: No Does the patient have difficulty walking or climbing stairs?: No Does the patient have difficulty dressing or bathing?: No Does the patient have difficulty doing errands alone such as visiting a doctor's office or shopping?: No    Assessment & Plan  1. Well woman exam  Discussed importance of 150 minutes of physical activity weekly, eat two servings of fish weekly, eat one serving of tree nuts ( cashews, pistachios, pecans, almonds.Marland Kitchen) every other day, eat 6 servings of fruit/vegetables daily and drink plenty of water and avoid sweet beverages.  - Insulin, 2 Hour - Hemoglobin A1c - Comp. Metabolic Panel (12) - CBC With Differential -  VITAMIN D 25 Hydroxy (Vit-D Deficiency, Fractures) - Lipid panel  2. Cervical high risk HPV (human papillomavirus) test positive  - PapIG, HPV, rfx 16/18  3. Need for TD vaccine  - Td : Tetanus/diphtheria >7yo Preservative  free  4. Breast cancer screening  Please call and schedule your mammogram

## 2016-07-30 NOTE — Patient Instructions (Signed)
Your goals:   Walk at least one mild per day after work  You will drink a can of soda instead of a bottle  You will pack your lunch three times a week.   Practice mindfulness

## 2016-08-03 ENCOUNTER — Other Ambulatory Visit: Payer: Self-pay | Admitting: Family Medicine

## 2016-08-03 DIAGNOSIS — R739 Hyperglycemia, unspecified: Secondary | ICD-10-CM

## 2016-08-03 LAB — CBC WITH DIFFERENTIAL
BASOS ABS: 0 10*3/uL (ref 0.0–0.2)
Basos: 1 %
EOS (ABSOLUTE): 0.1 10*3/uL (ref 0.0–0.4)
Eos: 1 %
HEMOGLOBIN: 15 g/dL (ref 11.1–15.9)
Hematocrit: 44.3 % (ref 34.0–46.6)
IMMATURE GRANULOCYTES: 0 %
Immature Grans (Abs): 0 10*3/uL (ref 0.0–0.1)
LYMPHS ABS: 1.8 10*3/uL (ref 0.7–3.1)
LYMPHS: 29 %
MCH: 29.6 pg (ref 26.6–33.0)
MCHC: 33.9 g/dL (ref 31.5–35.7)
MCV: 88 fL (ref 79–97)
MONOCYTES: 6 %
Monocytes Absolute: 0.4 10*3/uL (ref 0.1–0.9)
NEUTROS ABS: 3.8 10*3/uL (ref 1.4–7.0)
NEUTROS PCT: 63 %
RBC: 5.06 x10E6/uL (ref 3.77–5.28)
RDW: 13.9 % (ref 12.3–15.4)
WBC: 6 10*3/uL (ref 3.4–10.8)

## 2016-08-03 LAB — COMP. METABOLIC PANEL (12)
ALBUMIN: 5.1 g/dL (ref 3.5–5.5)
AST: 14 IU/L (ref 0–40)
Albumin/Globulin Ratio: 1.9 (ref 1.2–2.2)
Alkaline Phosphatase: 57 IU/L (ref 39–117)
BUN / CREAT RATIO: 8 — AB (ref 9–23)
BUN: 8 mg/dL (ref 6–24)
Bilirubin Total: 0.4 mg/dL (ref 0.0–1.2)
CALCIUM: 9.8 mg/dL (ref 8.7–10.2)
CREATININE: 1.04 mg/dL — AB (ref 0.57–1.00)
Chloride: 96 mmol/L (ref 96–106)
GFR calc Af Amer: 76 mL/min/{1.73_m2} (ref >59)
GFR, EST NON AFRICAN AMERICAN: 66 mL/min/{1.73_m2} (ref >59)
GLUCOSE: 115 mg/dL — AB (ref 65–99)
Globulin, Total: 2.7 g/dL (ref 1.5–4.5)
Potassium: 4 mmol/L (ref 3.5–5.2)
SODIUM: 138 mmol/L (ref 134–144)
Total Protein: 7.8 g/dL (ref 6.0–8.5)

## 2016-08-03 LAB — LIPID PANEL
Chol/HDL Ratio: 3.9 ratio (ref 0.0–4.4)
Cholesterol, Total: 296 mg/dL — ABNORMAL HIGH (ref 100–199)
HDL: 76 mg/dL (ref >39)
LDL Calculated: 198 mg/dL — ABNORMAL HIGH (ref 0–99)
TRIGLYCERIDES: 109 mg/dL (ref 0–149)
VLDL CHOLESTEROL CAL: 22 mg/dL (ref 5–40)

## 2016-08-03 LAB — INSULIN, FREE AND TOTAL
FREE INSULIN: 34 uU/mL — AB
Total Insulin: 34 uU/mL

## 2016-08-03 MED ORDER — CIPROFLOXACIN HCL 250 MG PO TABS: 250.0000 mg | ORAL_TABLET | Freq: Two times a day (BID) | ORAL | 0 refills | Status: DC

## 2016-08-06 ENCOUNTER — Ambulatory Visit (INDEPENDENT_AMBULATORY_CARE_PROVIDER_SITE_OTHER): Payer: 59 | Admitting: Family Medicine

## 2016-08-06 ENCOUNTER — Encounter: Payer: Self-pay | Admitting: Family Medicine

## 2016-08-06 VITALS — BP 124/80 | HR 73 | Temp 98.6°F | Resp 16 | Ht 65.0 in | Wt 166.5 lb

## 2016-08-06 DIAGNOSIS — N3001 Acute cystitis with hematuria: Secondary | ICD-10-CM

## 2016-08-06 DIAGNOSIS — R350 Frequency of micturition: Secondary | ICD-10-CM

## 2016-08-06 LAB — POCT URINALYSIS DIPSTICK
Bilirubin, UA: NEGATIVE
Glucose, UA: NEGATIVE
KETONES UA: NEGATIVE
Nitrite, UA: NEGATIVE
PH UA: 6 (ref 5.0–8.0)
PROTEIN UA: 100
SPEC GRAV UA: 1.025 (ref 1.010–1.025)
Urobilinogen, UA: 0.2 E.U./dL

## 2016-08-06 MED ORDER — PHENAZOPYRIDINE HCL 100 MG PO TABS: 100.0000 mg | ORAL_TABLET | Freq: Two times a day (BID) | ORAL | 0 refills | Status: DC | PRN

## 2016-08-06 MED ORDER — CIPROFLOXACIN HCL 250 MG PO TABS: 250.0000 mg | ORAL_TABLET | Freq: Two times a day (BID) | ORAL | 0 refills | Status: DC

## 2016-08-06 NOTE — Progress Notes (Addendum)
Name: Tanya Robinson   MRN: [id number]    DOB: Oct 20, 1975   Date:08/06/2016       Progress Note  Subjective  Chief Complaint  Chief Complaint  Patient presents with  . Dysuria    pain when urinating    HPI  Pt reports 6 day history of dysuria. She endorses urgency, frequency, low pelvic with urination (9//10 pain), and bilateral low back pain.  She states she never obtained Cipro Rx that was prescribed to her on Friday by Dr. Ancil Boozer. No history of kidney stones, no flank pain, fever/chills, NVD.   Patient Active Problem List   Diagnosis Date Noted  . 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 Bee sting allergy 07/28/2015  . Dyslipidemia 11/02/2014  . Migraine without aura and responsive to treatment 11/02/2014  . Overweight 11/02/2014  . Vitamin D deficiency 11/02/2014  . Urinary incontinence in female 11/02/2014   Social History  Substance Use Topics  . Smoking status: Never Smoker  . Smokeless tobacco: Never Used  . Alcohol use No    Current Outpatient Prescriptions:  .  cholecalciferol (VITAMIN D) 1000 units tablet, Take 2,000 Units by mouth., Disp: , Rfl:  .  amitriptyline (ELAVIL) 25 MG tablet, Take 1 tablet (25 mg total) by mouth at bedtime. (Patient not taking: Reported on 08/06/2016), Disp: 30 tablet, Rfl: 2 .  EPINEPHrine (EPIPEN 2-PAK) 0.3 mg/0.3 mL IJ SOAJ injection, Inject 0.3 mLs (0.3 mg total) into the muscle once. (Patient not taking: Reported on 08/06/2016), Disp: 2 Device, Rfl: 0  Allergies  Allergen Reactions  . Tea Hives  . Bee Venom     Bee    ROS Constitutional: Negative for fever/chills or weight change.  Respiratory: Negative for cough and shortness of breath.   Cardiovascular: Negative for chest pain or palpitations.  Gastrointestinal: Negative for abdominal pain, no bowel changes.  Musculoskeletal: Negative for gait problem or joint swelling or aches.  Skin: Negative for rash.  Neurological: Negative for dizziness  or headache.  No other specific complaints in a complete review of systems (except as listed in HPI above).  Objective  Vitals:   08/06/16 0807  BP: 124/80  Pulse: 73  Resp: 16  Temp: 98.6 F (37 C)  TempSrc: Oral  SpO2: 94%  Weight: 166 lb 8 oz (75.5 kg)  Height: 5' 5"  (1.651 m)    Body mass index is 27.71 kg/m.  Nursing Note and Vital Signs reviewed.  Physical Exam  Constitutional: Patient appears well-developed and well-nourished.  No distress.  HEENT: head atraumatic, normocephalic, neck supple without lymphadenopathy, oropharynx pink and moist without exudate Cardiovascular: Normal rate, regular rhythm, S1/S2 present.  No murmur or rub heard. No BLE edema. Pulmonary/Chest: Effort normal and breath sounds clear. No respiratory distress or retractions. Abdominal: Soft abdomen, bowel sounds present x4 quadrants. No CVA tenderness. BLQ mildly tender on palpation, no guarding, no masses. Psychiatric: Patient has a normal mood and affect. behavior is normal. Judgment and thought content normal.  Recent Results (from the past 2160 hour(s))  CBC With Differential     Status: None   Collection Time: 07/30/16  3:26 PM  Result Value Ref Range   WBC 6.0 3.4 - 10.8 x10E3/uL   RBC 5.06 3.77 - 5.28 x10E6/uL   Hemoglobin 15.0 11.1 - 15.9 g/dL   Hematocrit 44.3 34.0 - 46.6 %   MCV 88 79 - 97 fL   MCH 29.6 26.6 - 33.0 pg   MCHC 33.9 31.5 -  35.7 g/dL   RDW 13.9 12.3 - 15.4 %   Neutrophils 63 Not Estab. %   Lymphs 29 Not Estab. %   Monocytes 6 Not Estab. %   Eos 1 Not Estab. %   Basos 1 Not Estab. %   Neutrophils Absolute 3.8 1.4 - 7.0 x10E3/uL   Lymphocytes Absolute 1.8 0.7 - 3.1 x10E3/uL   Monocytes Absolute 0.4 0.1 - 0.9 x10E3/uL   EOS (ABSOLUTE) 0.1 0.0 - 0.4 x10E3/uL   Basophils Absolute 0.0 0.0 - 0.2 x10E3/uL   Immature Granulocytes 0 Not Estab. %   Immature Grans (Abs) 0.0 0.0 - 0.1 x10E3/uL  Comp. Metabolic Panel (12)     Status: Abnormal   Collection Time: 07/30/16   3:26 PM  Result Value Ref Range   Glucose 115 (H) 65 - 99 mg/dL   BUN 8 6 - 24 mg/dL   Creatinine, Ser 1.04 (H) 0.57 - 1.00 mg/dL   GFR calc non Af Amer 66 >59 mL/min/1.73   GFR calc Af Amer 76 >59 mL/min/1.73   BUN/Creatinine Ratio 8 (L) 9 - 23   Sodium 138 134 - 144 mmol/L   Potassium 4.0 3.5 - 5.2 mmol/L   Chloride 96 96 - 106 mmol/L   Calcium 9.8 8.7 - 10.2 mg/dL   Total Protein 7.8 6.0 - 8.5 g/dL   Albumin 5.1 3.5 - 5.5 g/dL   Globulin, Total 2.7 1.5 - 4.5 g/dL   Albumin/Globulin Ratio 1.9 1.2 - 2.2   Bilirubin Total 0.4 0.0 - 1.2 mg/dL   Alkaline Phosphatase 57 39 - 117 IU/L   AST 14 0 - 40 IU/L  Lipid panel     Status: Abnormal   Collection Time: 07/30/16  3:26 PM  Result Value Ref Range   Cholesterol, Total 296 (H) 100 - 199 mg/dL   Triglycerides 109 0 - 149 mg/dL   HDL 76 >39 mg/dL   VLDL Cholesterol Cal 22 5 - 40 mg/dL   LDL Calculated 198 (H) 0 - 99 mg/dL   Chol/HDL Ratio 3.9 0.0 - 4.4 ratio    Comment:                                   T. Chol/HDL Ratio                                             Men  Women                               1/2 Avg.Risk  3.4    3.3                                   Avg.Risk  5.0    4.4                                2X Avg.Risk  9.6    7.1                                3X Avg.Risk 23.4   11.0  Insulin, Free and Total     Status: Abnormal   Collection Time: 07/30/16  3:26 PM  Result Value Ref Range   Free Insulin 34 (H) uU/mL    Comment: Reference Range: Pubertal Children and Adults (fasting): 0 - 17    Total Insulin 34 uU/mL    Comment: Non-Diabetic:  In the absence of insulin-binding antibodies, the free and total insulin assays are equivalent. However, this assay is intended for use in diabetics with insulin autoantibody present. Measurement is performed on acid-treated samples and, therefore, the sensitivity and absolute values by this method may differ from our direct insulin ICMA. Insulin Dependent Diabetic Patients:   Free Insulin levels vary depending on the capacity and affinity of circulating insulin-binding antibodies and the dose of insulin given to the patient.  Total insulin levels represent free insulin and antibody bound insulin fractions.   POCT urinalysis dipstick     Status: Abnormal   Collection Time: 08/06/16  8:14 AM  Result Value Ref Range   Color, UA yellow    Clarity, UA cloudy    Glucose, UA negative    Bilirubin, UA negative    Ketones, UA negative    Spec Grav, UA 1.025 1.010 - 1.025   Blood, UA moderate    pH, UA 6.0 5.0 - 8.0   Protein, UA 100    Urobilinogen, UA 0.2 0.2 or 1.0 E.U./dL   Nitrite, UA negative    Leukocytes, UA Large (3+) (A) Negative     Assessment & Plan  1. Acute cystitis with hematuria - ciprofloxacin (CIPRO) 250 MG tablet; Take 1 tablet (250 mg total) by mouth 2 (two) times daily.  Dispense: 6 tablet; Refill: 0 -Re-ordered Cipro and advised patient to fill and begin today. - phenazopyridine (PYRIDIUM) 100 MG tablet; Take 1 tablet (100 mg total) by mouth 2 (two) times daily as needed for pain.  Dispense: 10 tablet; Refill: 0 - Urine Culture  2. Urinary frequency - POCT urinalysis dipstick - Urine Culture  Red flags and when to present for emergency care including new/worsening symptoms, no improvement in 2-5 days, nausea/vomiting, fever >101.59F, reviewed with patient at time of visit. Follow up and care instructions discussed and provided in AVS. Discussed possible causes of acute cystitis and ways to prevent in the future.   I have reviewed this encounter including the documentation in this note and/or discussed this patient with the Johney Maine, FNP, NP-C. I am certifying that I agree with the content of this note as supervising physician.  Steele Sizer, MD [facility] 08/13/2016, 1:31 PM

## 2016-08-06 NOTE — Patient Instructions (Signed)

## 2016-08-08 LAB — URINE CULTURE

## 2016-08-08 LAB — PLEASE NOTE

## 2016-08-08 NOTE — Progress Notes (Signed)
Could you please call Ms. Graves and let her know that the antibiotic we prescribed will kill the bacteria that caused her UTI, so no medication changes are needed.  Please ask her how she is feeling.  If she is not improving, please let me know. Thank you!

## 2016-08-09 NOTE — Progress Notes (Signed)
Called v/m not set up

## 2016-09-10 ENCOUNTER — Ambulatory Visit: Payer: 59 | Admitting: Family Medicine

## 2017-03-06 ENCOUNTER — Ambulatory Visit: Payer: 59 | Admitting: Family Medicine

## 2017-03-06 ENCOUNTER — Encounter: Payer: Self-pay | Admitting: Family Medicine

## 2017-03-06 VITALS — BP 120/84 | HR 84 | Temp 98.4°F | Resp 16 | Ht 65.0 in | Wt 157.0 lb

## 2017-03-06 DIAGNOSIS — R110 Nausea: Secondary | ICD-10-CM | POA: Diagnosis not present

## 2017-03-06 DIAGNOSIS — R05 Cough: Secondary | ICD-10-CM | POA: Diagnosis not present

## 2017-03-06 DIAGNOSIS — R059 Cough, unspecified: Secondary | ICD-10-CM

## 2017-03-06 DIAGNOSIS — J069 Acute upper respiratory infection, unspecified: Secondary | ICD-10-CM

## 2017-03-06 MED ORDER — BENZONATATE 100 MG PO CAPS: 200.0000 mg | ORAL_CAPSULE | Freq: Three times a day (TID) | ORAL | 0 refills | Status: DC | PRN

## 2017-03-06 MED ORDER — ONDANSETRON HCL 4 MG PO TABS: 4.0000 mg | ORAL_TABLET | Freq: Three times a day (TID) | ORAL | 0 refills | Status: DC | PRN

## 2017-03-06 NOTE — Patient Instructions (Addendum)
You may take 400-665m Ibuprofen as needed for headache pain - take with food.  You may also take Tylenol as needed for headache pain.  Nausea, Adult Feeling sick to your stomach (nausea) means that your stomach is upset or you feel like you have to throw up (vomit). Feeling sick to your stomach is usually not serious, but it may be an early sign of a more serious medical problem. As you feel sicker to your stomach, it can lead to throwing up (vomiting). If you throw up, or if you are not able to drink enough fluids, there is a risk of dehydration. Dehydration can make you feel tired and thirsty, have a dry mouth, and pee (urinate) less often. Older adults and people who have other diseases or a weak defense (immune) system have a higher risk of dehydration. The main goal of treating this condition is to:  Limit how often you feel sick to your stomach.  Prevent throwing up and dehydration.  Follow these instructions at home: Follow instructions from your doctor about how to care for yourself at home. Eating and drinking Follow these recommendations as told by your doctor:  Take an oral rehydration solution (ORS). This is a drink that is sold at pharmacies and stores.  Drink clear fluids in small amounts as you are able, such as: ? Water. ? Ice chips. ? Fruit juice that has water added (diluted fruit juice). ? Low-calorie sports drinks.  Eat bland, easy to digest foods in small amounts as you are able, such as: ? Bananas. ? Applesauce. ? Rice. ? Lean meats. ? Toast. ? Crackers.  Avoid drinking fluids that contain a lot of sugar or caffeine.  Avoid alcohol.  Avoid spicy or fatty foods.  General instructions  Drink enough fluid to keep your pee (urine) clear or pale yellow.  Wash your hands often. If you cannot use soap and water, use hand sanitizer.  Make sure that all people in your household wash their hands well and often.  Rest at home while you get better.  Take  over-the-counter and prescription medicines only as told by your doctor.  Breathe slowly and deeply when you feel sick to your stomach.  Watch your condition for any changes.  Keep all follow-up visits as told by your doctor. This is important. Contact a doctor if:  You have a headache.  You have new symptoms.  You feel sicker to your stomach.  You have a fever.  You feel light-headed or dizzy.  You throw up.  You are not able to keep fluids down. Get help right away if:  You have pain in your chest, neck, arm, or jaw.  You feel very weak or you pass out (faint).  You have throw up that is bright red or looks like coffee grounds.  You have bloody or black poop (stools), or poop that looks like tar.  You have a very bad headache, a stiff neck, or both.  You have very bad pain, cramping, or bloating in your belly.  You have a rash.  You have trouble breathing or you are breathing very quickly.  Your heart is beating very quickly.  Your skin feels cold and clammy.  You feel confused.  You have pain while peeing.  You have signs of dehydration, such as: ? Dark pee, or very little or no pee. ? Cracked lips. ? Dry mouth. ? Sunken eyes. ? Sleepiness. ? Weakness. These symptoms may be an emergency. Do not wait to  see if the symptoms will go away. Get medical help right away. Call your local emergency services (911 in the [place] Do not drive yourself to the hospital. This information is not intended to replace advice given to you by your health care provider. Make sure you discuss any questions you have with your health care provider. Document Released: 03/29/2011 Document Revised: 09/15/2015 Document Reviewed: 12/14/2014 Elsevier Interactive Patient Education  [employer].

## 2017-03-06 NOTE — Progress Notes (Signed)
Name: Tanya Robinson   MRN: [id number]    DOB: 27-Oct-1975   Date:03/06/2017       Progress Note  Subjective  Chief Complaint  Chief Complaint  Patient presents with  . Facial Pain    coughed up blood this morning  . Headache    HPI  Patient presents with several days of frontal sinus headache, new onset cough today.  Patient reports gagging and coughing up small specks of blood after brushing her teeth this morning.  Endorses nausea x2 days, with decreased appetite, subjective fevers/chills. Cough is described as dry - pt is non-smoker.  Recent sick contacts - multiple co-workers.  Denies chest pain or shortness of breath/wheezing, abdominal pain, vomiting, diarrhea, nasal congestion, ear pain/pressure, vision changes, body aches.   Patient Active Problem List   Diagnosis Date Noted  . 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 Bee sting allergy 07/28/2015  . Dyslipidemia 11/02/2014  . Migraine without aura and responsive to treatment 11/02/2014  . Overweight 11/02/2014  . Vitamin D deficiency 11/02/2014  . Urinary incontinence in female 11/02/2014    Social History   Tobacco Use  . Smoking status: Never Smoker  . Smokeless tobacco: Never Used  Substance Use Topics  . Alcohol use: No     Current Outpatient Medications:  .  EPINEPHrine (EPIPEN 2-PAK) 0.3 mg/0.3 mL IJ SOAJ injection, Inject 0.3 mLs (0.3 mg total) into the muscle once., Disp: 2 Device, Rfl: 0 .  amitriptyline (ELAVIL) 25 MG tablet, Take 1 tablet (25 mg total) by mouth at bedtime. (Patient not taking: Reported on 08/06/2016), Disp: 30 tablet, Rfl: 2 .  cholecalciferol (VITAMIN D) 1000 units tablet, Take 2,000 Units by mouth., Disp: , Rfl:  .  ciprofloxacin (CIPRO) 250 MG tablet, Take 1 tablet (250 mg total) by mouth 2 (two) times daily. (Patient not taking: Reported on 03/06/2017), Disp: 6 tablet, Rfl: 0 .  phenazopyridine (PYRIDIUM) 100 MG tablet, Take 1 tablet (100 mg total) by  mouth 2 (two) times daily as needed for pain. (Patient not taking: Reported on 03/06/2017), Disp: 10 tablet, Rfl: 0  Allergies  Allergen Reactions  . Tea Hives  . Bee Venom     Bee    ROS  Ten systems reviewed and is negative except as mentioned in HPI  Objective  Vitals:   03/06/17 1058  BP: 120/84  Pulse: 84  Resp: 16  Temp: 98.4 F (36.9 C)  TempSrc: Oral  SpO2: 96%  Weight: 157 lb (71.2 kg)  Height: 5' 5"  (1.651 m)   Body mass index is 26.13 kg/m.  Nursing Note and Vital Signs reviewed.  Physical Exam  Constitutional: Patient appears well-developed and well-nourished. No distress.  HEENT: head atraumatic, normocephalic, pupils equal and reactive to light, EOM's intact, TM's without erythema or bulging, no maxillary or frontal sinus pain on palpation, neck supple without lymphadenopathy, oropharynx pink and moist without exudate Cardiovascular: Normal rate, regular rhythm, S1/S2 present.  No murmur or rub heard. No BLE edema. Pulmonary/Chest: Effort normal and breath sounds clear. No respiratory distress or retractions. Abdominal: Soft and non-tender, bowel sounds present x4 quadrants. Psychiatric: Patient has a normal mood and affect. behavior is normal. Judgment and thought content normal.  No results found for this or any previous visit (from the past 2160 hour(s)).   Assessment & Plan  1. Viral upper respiratory illness - Drink plenty of fluids, rest, take Aleve and/or Tylenol PRN for headache pain. - Reassurance that this  is likely viral illness, however if not improving in 3-5 days, or if worsening of symptoms, she should return for further evaluation  2. Cough - benzonatate (TESSALON PERLES) 100 MG capsule; Take 2 capsules (200 mg total) 3 (three) times daily as needed by mouth.  Dispense: 20 capsule; Refill: 0 - Advised that if she notes ongoing blood in sputum to return for further evaluation. Reassurance given that there was a very small amount and  only one episode.  Encouraged pt to remain hydrated and use cool mist humidifier at night to ensure air has moisture in it while her heat is on.  3. Nausea - ondansetron (ZOFRAN) 4 MG tablet; Take 1 tablet (4 mg total) every 8 (eight) hours as needed by mouth for nausea or vomiting.  Dispense: 20 tablet; Refill: 0 - [place] diet discussed in detail.  - Return if symptoms worsen or fail to improve, for 3-5 days. - Work note to return on 03/08/2017 to be provided. -Red flags and when to present for emergency care or RTC including fever >101.52F, chest pain, shortness of breath, new/worsening/un-resolving symptoms, reviewed with patient at time of visit. Follow up and care instructions discussed and provided in AVS.

## 2017-03-20 ENCOUNTER — Telehealth: Payer: Self-pay

## 2017-03-20 NOTE — Telephone Encounter (Signed)
Could you call patient and double check on this - I completed form and [facility] faxed it. If they did not receive, please print from the scan we have and re-fax. Thanks!

## 2017-03-20 NOTE — Telephone Encounter (Signed)
Copied from [place] [phone]. Topic: Inquiry >> Mar 08, 2017 10:54 AM Ahmed Prima L wrote: Needs the letter to be released to go back to work to go to her manager Sherron Ales so it can go to HR. Please re send the letter from Raelyn Ensign one more time please so she can be released to work.... FAX number [phone] I see where emily sent it on the 14th but the patient says her work did not get it.   Form was faxed on 03/07/17 and has been scanned into her chart.  Tried to contact patient but there was no answer and her mailbox was full so I was not able to leave a message.

## 2017-03-28 ENCOUNTER — Telehealth: Payer: Self-pay | Admitting: Family Medicine

## 2017-03-28 ENCOUNTER — Telehealth: Payer: Self-pay

## 2017-03-28 NOTE — Telephone Encounter (Signed)
Attempted to reach pt to assure she would be by fax machine but phone rang and voice on her work voice mail was a female so did not leave a message. Will try to call back

## 2017-03-28 NOTE — Telephone Encounter (Signed)
Forms were faxed to pt's employers. There is another phone note in regards to this. Will close this one.

## 2017-03-28 NOTE — Telephone Encounter (Signed)
Could you please print from Media tab on 03/06/2017 and re-fax this paperwork to the number below for the patient?  Thank you!   Copied from [place] [phone]. Topic: General - Other >> Mar 28, 2017 10:41 AM Vernona Rieger wrote: Patient had some FMLA papers sent through [place] she wants to know if they can be faxed to her work, [phone]. Please advise. [phone] her work number.

## 2017-03-29 ENCOUNTER — Telehealth: Payer: Self-pay

## 2017-03-29 NOTE — Telephone Encounter (Signed)
Copied from [place] [phone]. Topic: General - Other >> Mar 28, 2017 10:41 AM Vernona Rieger wrote: Patient had some FMLA papers sent through [place] she wants to know if they can be faxed to her work, [phone]. Please advise. [phone] her work number.   >> Mar 28, 2017 12:10 PM Tye [place] wrote: Pt's employer has not received that [facility] paperwork yet, if its ready send when possible

## 2017-03-29 NOTE — Telephone Encounter (Signed)
Forms were faxed to patient's employer yesterday, several phone notes about this. Will close this one

## 2017-04-02 IMAGING — CT CT ABD-PELV W/ CM
1 of 3 series · 14 of 32 positions shown, 19 images · IV contrast (omnipaque)
Comparison: Pelvic ultrasound performed earlier today at [DATE] p.m.

CLINICAL DATA: Acute onset of right lower quadrant abdominal pain.
Initial encounter.

EXAM:
CT ABDOMEN AND PELVIS WITH CONTRAST
TECHNIQUE: Multidetector CT imaging of the abdomen and pelvis was performed
using the standard protocol following bolus administration of
intravenous contrast.
CONTRAST:  100mL OMNIPAQUE IOHEXOL 300 MG/ML  SOLN

[Series 2: routine abd pel with · axial · 0.64mm/px · z∈[-1110,-696]mm · 14 of 93 slices shown, 19 images]
[im 5/93  soft-tissue]
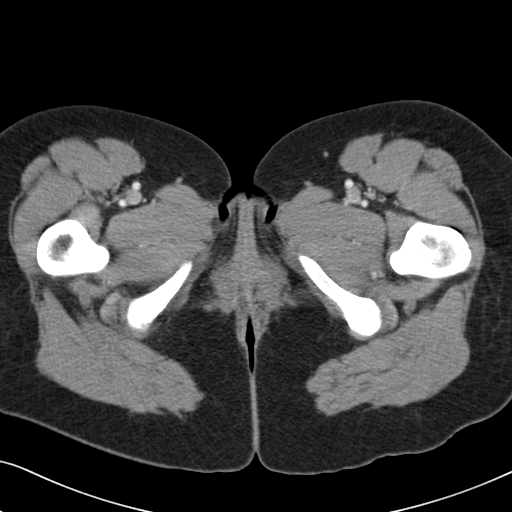
[im 5/93  bone]
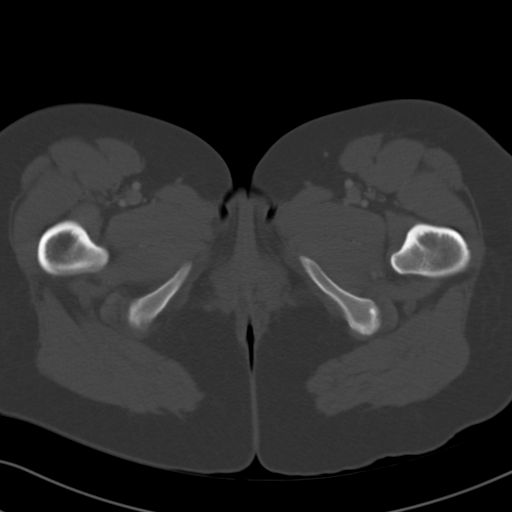
[im 14/93  soft-tissue]
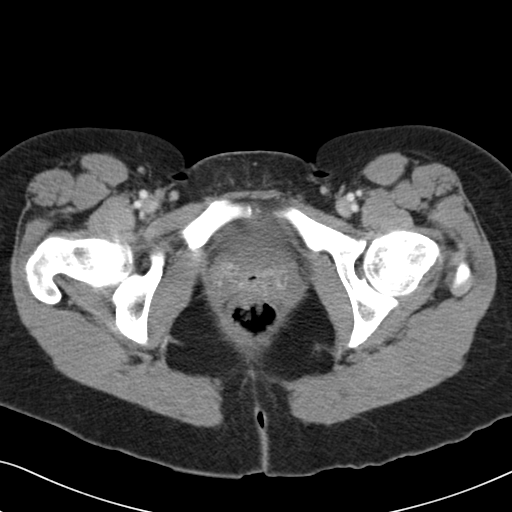
[im 18/93  soft-tissue]
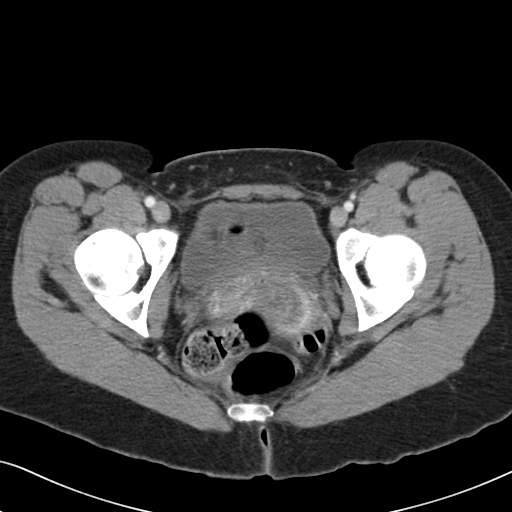
[im 27/93  soft-tissue]
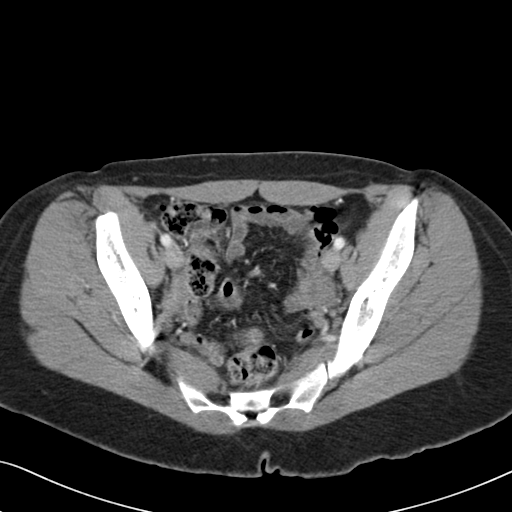
[im 31/93  soft-tissue]
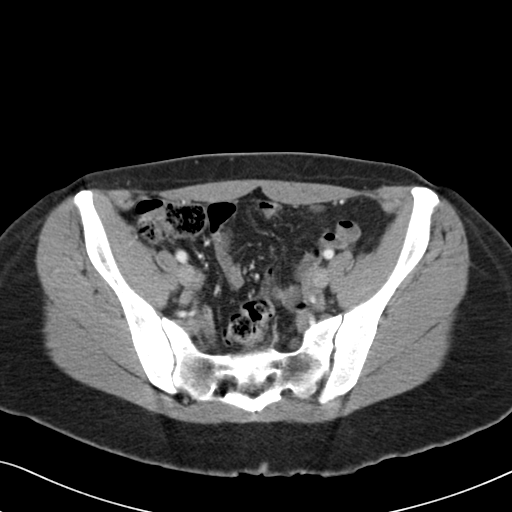
[im 40/93  soft-tissue]
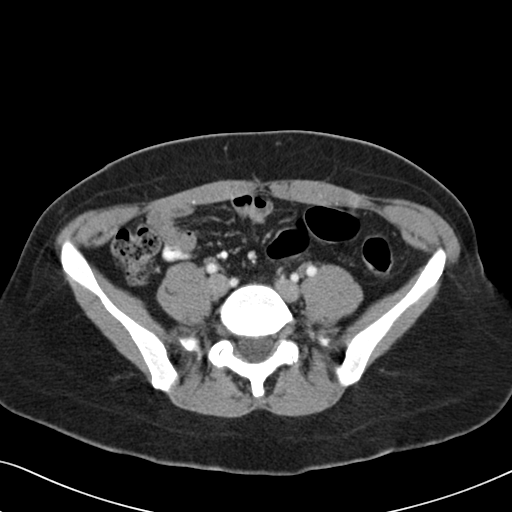
[im 49/93  soft-tissue]
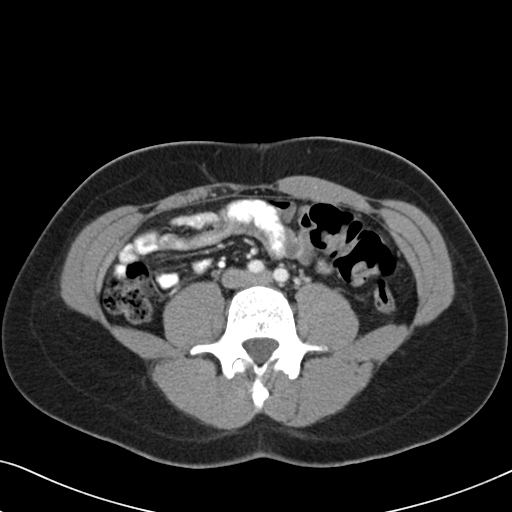
[im 53/93  soft-tissue]
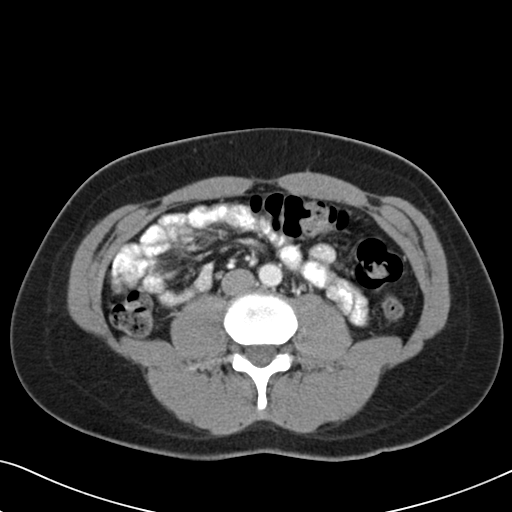
[im 62/93  soft-tissue]
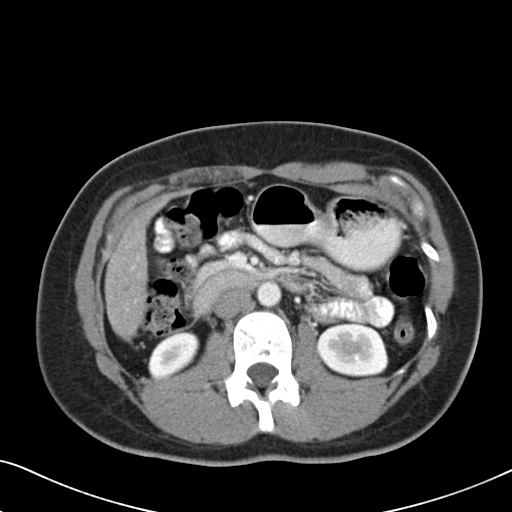
[im 62/93  bone]
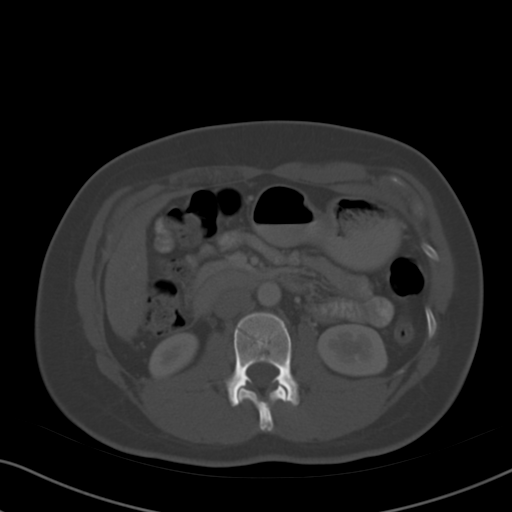
[im 66/93  soft-tissue]
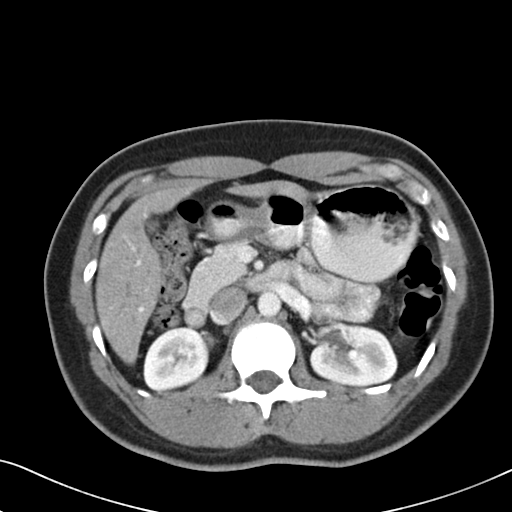
[im 75/93  soft-tissue]
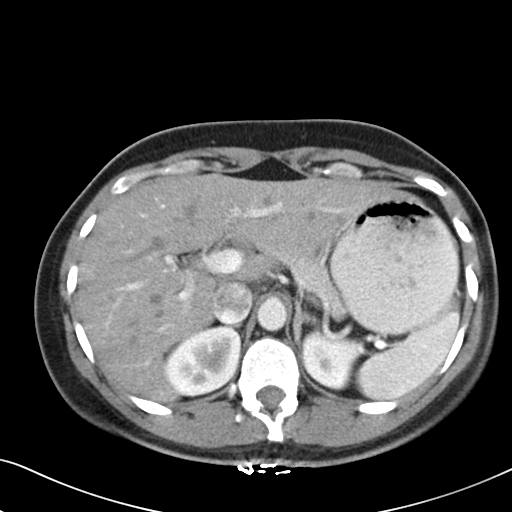
[im 75/93  lung]
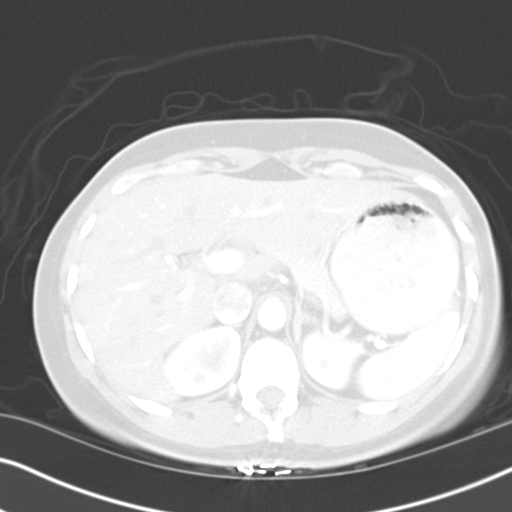
[im 79/93  soft-tissue]
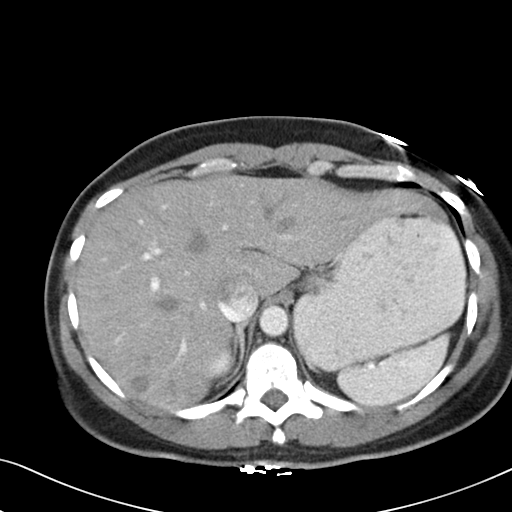
[im 79/93  lung]
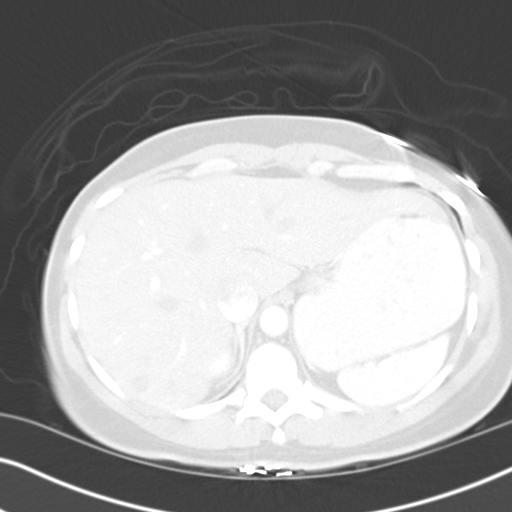
[im 84/93  lung]
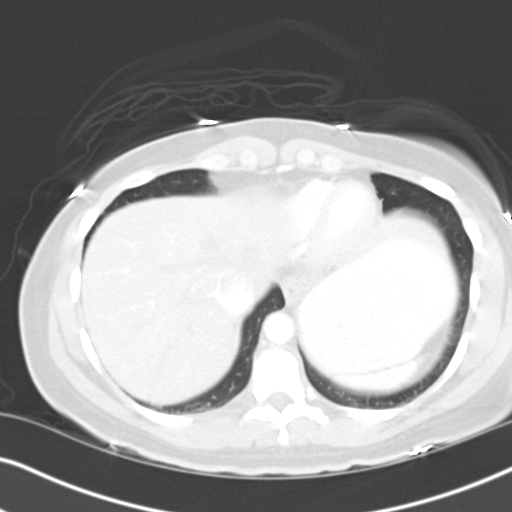
[im 88/93  soft-tissue]
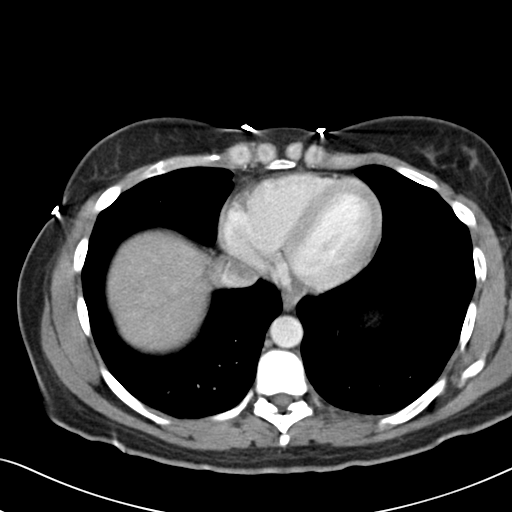
[im 88/93  lung]
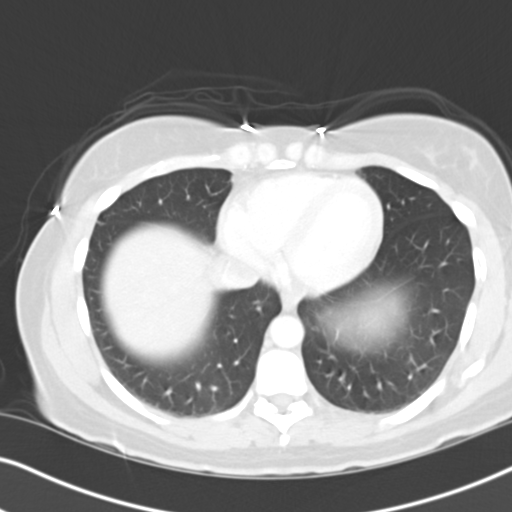

[14 of 32 positions shown; findings below may reference images not displayed]

FINDINGS: The visualized lung bases are clear.

Several nonspecific hypodensities are seen within the liver,
measuring up to 1.3 cm in size. The spleen is unremarkable in
appearance. The gallbladder is within normal limits. The pancreas
and adrenal glands are unremarkable.

The kidneys are unremarkable in appearance. There is no evidence of
hydronephrosis. No renal or ureteral stones are seen. No perinephric
stranding is appreciated.

No free fluid is identified. The small bowel is unremarkable in
appearance. The stomach is within normal limits. No acute vascular
abnormalities are seen.

The appendix is noted on coronal images and is unremarkable in
appearance, without evidence for appendicitis. The colon is
unremarkable in appearance.

The bladder is mildly distended and grossly unremarkable. The
vaginal cuff is somewhat prominent, but likely within normal limits,
status post hysterectomy. No suspicious adnexal masses are seen. The
ovaries appear grossly symmetric. No inguinal lymphadenopathy is
seen.

No acute osseous abnormalities are identified.
IMPRESSION: 1. No acute abnormality seen within the abdomen or pelvis.
2. Several nonspecific hypodensities within the liver, measuring up
to 1.3 cm in size. Given normal LFTs and the patient's demographics,
these are likely benign.

## 2017-05-15 ENCOUNTER — Encounter: Payer: Self-pay | Admitting: Family Medicine

## 2017-05-15 ENCOUNTER — Ambulatory Visit: Payer: 59 | Admitting: Family Medicine

## 2017-05-15 VITALS — BP 100/70 | HR 70 | Temp 98.5°F | Resp 14 | Ht 65.0 in | Wt 157.5 lb

## 2017-05-15 DIAGNOSIS — J029 Acute pharyngitis, unspecified: Secondary | ICD-10-CM

## 2017-05-15 DIAGNOSIS — R6889 Other general symptoms and signs: Secondary | ICD-10-CM | POA: Diagnosis not present

## 2017-05-15 LAB — POCT INFLUENZA A/B
INFLUENZA A, POC: NEGATIVE
Influenza B, POC: NEGATIVE

## 2017-05-15 LAB — POCT RAPID STREP A (OFFICE): Rapid Strep A Screen: NEGATIVE

## 2017-05-15 MED ORDER — BENZONATATE 100 MG PO CAPS
100.0000 mg | ORAL_CAPSULE | Freq: Three times a day (TID) | ORAL | 0 refills | Status: DC | PRN
Start: 2017-05-15 — End: 2017-12-25

## 2017-05-15 NOTE — Progress Notes (Signed)
Name: Tanya Robinson   MRN: [id number]    DOB: [birth year]   Date:05/15/2017       Progress Note  Subjective  Chief Complaint  Chief Complaint  Patient presents with  . Influenza    HPI  URI: she states symptoms started with ear pain three days ago, followed by watery eyes, headache, sore throat, sneezing, mild cough. Last night she felt worse with fever of 101 yesterday morning, body aches and chills. No nausea, vomiting or change in bowel movements. She has noticed decrease in appetite. No rashes.   Patient Active Problem List   Diagnosis Date Noted  . 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 Bee sting allergy 07/28/2015  . Dyslipidemia 11/02/2014  . Migraine without aura and responsive to treatment 11/02/2014  . Overweight 11/02/2014  . Vitamin D deficiency 11/02/2014  . Urinary incontinence in female 11/02/2014    Social History   Tobacco Use  . Smoking status: Never Smoker  . Smokeless tobacco: Never Used  Substance Use Topics  . Alcohol use: No     Current Outpatient Medications:  .  cholecalciferol (VITAMIN D) 1000 units tablet, Take 2,000 Units by mouth., Disp: , Rfl:  .  EPINEPHrine (EPIPEN 2-PAK) 0.3 mg/0.3 mL IJ SOAJ injection, Inject 0.3 mLs (0.3 mg total) into the muscle once., Disp: 2 Device, Rfl: 0  Allergies  Allergen Reactions  . Tea Hives  . Bee Venom     Bee    ROS  Ten systems reviewed and is negative except as mentioned in HPI   Objective  Vitals:   05/15/17 1056  BP: 100/70  Pulse: 70  Resp: 14  Temp: 98.5 F (36.9 C)  TempSrc: Oral  SpO2: 99%  Weight: 157 lb 8 oz (71.4 kg)  Height: 5' 5"  (1.651 m)    Body mass index is 26.21 kg/m.    Physical Exam  Constitutional: Patient appears well-developed and well-nourished.  No distress.  HEENT: head atraumatic, normocephalic, pupils equal and reactive to light, ears : normal TM, neck supple, throat within normal limits, boggy  turbinates Cardiovascular: Normal rate, regular rhythm and normal heart sounds.  No murmur heard. No BLE edema. Pulmonary/Chest: Effort normal and breath sounds normal. No respiratory distress. Abdominal: Soft.  There is no tenderness. Psychiatric: Patient has a normal mood and affect. behavior is normal. Judgment and thought content normal.  Recent Results (from the past 2160 hour(s))  POCT Influenza A/B     Status: Normal   Collection Time: 05/15/17 11:24 AM  Result Value Ref Range   Influenza A, POC Negative Negative   Influenza B, POC Negative Negative     Assessment & Plan  1. Flu-like symptoms  - POCT Influenza A/B Viral illness, not the flu, we will keep her at home , return to work on Monday, fluids, rest, and medication for symptoms.  - benzonatate (TESSALON) 100 MG capsule; Take 1-2 capsules (100-200 mg total) by mouth 3 (three) times daily as needed.  Dispense: 40 capsule; Refill: 0   2. Sore throat  - POCT rapid strep A

## 2017-05-30 ENCOUNTER — Telehealth: Payer: Self-pay

## 2017-05-30 NOTE — Telephone Encounter (Signed)
Copied from [place]. Topic: Inquiry >> May 30, 2017  9:16 AM Aurelio Brash B wrote: Reason for CRM: PT is asking if her fmla was faxed back to the [employer]   fax number [phone]

## 2017-06-10 ENCOUNTER — Telehealth: Payer: Self-pay

## 2017-06-10 NOTE — Telephone Encounter (Signed)
I just re-faxed the forms she is more than welcome to pick up what we have, however, she may need to have the [place] to resend and have Dr Ancil Boozer to refill them out due to how the forms turned out.

## 2017-06-10 NOTE — Telephone Encounter (Signed)
Copied from [place]. Topic: Inquiry >> Jun 07, 2017  3:56 PM Bea Graff, NT wrote: Reason for CRM: Pt calling to check and see if her FMLA papers were able to be picked up so that she can pick them up today or Monday.

## 2017-06-10 NOTE — Telephone Encounter (Signed)
We are going to re-fax it now

## 2017-07-01 ENCOUNTER — Telehealth: Payer: Self-pay | Admitting: Family Medicine

## 2017-07-01 NOTE — Telephone Encounter (Signed)
patient is needing cop of shot record

## 2017-07-01 NOTE — Telephone Encounter (Signed)
The only immunization we have in our [place] is her Tdap Vaccine. Tried to call the patient to inform her of this and see if she needed an official copy of her Tdap vaccination or was she needing a complete record? If the patient needs a complete record she will have to request it from her old school or state she lived at and fax it to [place]. Then I will be able to print it out with all her immunizations.

## 2017-09-02 ENCOUNTER — Encounter: Payer: Managed Care, Other (non HMO) | Admitting: Family Medicine

## 2017-12-25 ENCOUNTER — Ambulatory Visit (INDEPENDENT_AMBULATORY_CARE_PROVIDER_SITE_OTHER): Payer: Commercial Managed Care - PPO | Admitting: Family Medicine

## 2017-12-25 ENCOUNTER — Encounter: Payer: Self-pay | Admitting: Family Medicine

## 2017-12-25 ENCOUNTER — Other Ambulatory Visit (HOSPITAL_COMMUNITY)
Admission: RE | Admit: 2017-12-25 | Discharge: 2017-12-25 | Disposition: A | Discharge status: Home / Self Care | Payer: Commercial Managed Care - PPO | Source: Ambulatory Visit | Attending: Family Medicine | Admitting: Family Medicine

## 2017-12-25 VITALS — BP 104/68 | HR 89 | Temp 98.4°F | Resp 16 | Ht 65.0 in | Wt 160.2 lb

## 2017-12-25 DIAGNOSIS — Z113 Encounter for screening for infections with a predominantly sexual mode of transmission: Secondary | ICD-10-CM

## 2017-12-25 DIAGNOSIS — R87810 Cervical high risk human papillomavirus (HPV) DNA test positive: Secondary | ICD-10-CM | POA: Diagnosis present

## 2017-12-25 DIAGNOSIS — Z1231 Encounter for screening mammogram for malignant neoplasm of breast: Secondary | ICD-10-CM | POA: Diagnosis not present

## 2017-12-25 DIAGNOSIS — Z124 Encounter for screening for malignant neoplasm of cervix: Secondary | ICD-10-CM | POA: Diagnosis not present

## 2017-12-25 DIAGNOSIS — Z23 Encounter for immunization: Secondary | ICD-10-CM | POA: Diagnosis not present

## 2017-12-25 DIAGNOSIS — Z01419 Encounter for gynecological examination (general) (routine) without abnormal findings: Secondary | ICD-10-CM | POA: Diagnosis not present

## 2017-12-25 DIAGNOSIS — E559 Vitamin D deficiency, unspecified: Secondary | ICD-10-CM

## 2017-12-25 DIAGNOSIS — Z1239 Encounter for other screening for malignant neoplasm of breast: Secondary | ICD-10-CM

## 2017-12-25 MED ORDER — VITAMIN D 50 MCG (2000 UT) PO CAPS: 1.0000 | ORAL_CAPSULE | Freq: Every day | ORAL | 0 refills | Status: DC

## 2017-12-25 NOTE — Progress Notes (Signed)
Name: Tanya Robinson   MRN: [id number]    DOB: Jan 17, 1976   Date:12/25/2017       Progress Note  Subjective  Chief Complaint  Chief Complaint  Patient presents with  . Annual Exam    HPI   Patient presents for annual CPE   Diet: discussed high calcium diet, lots of fruit and vegetables. Lean protein  Exercise: needs to increase frequency and amount of exercise.   USPSTF grade A and B recommendations   Depression:  Depression screen [facility] 2/9 12/25/2017 07/30/2016 02/07/2016 07/28/2015 11/02/2014  Decreased Interest 0 0 0 0 0  Down, Depressed, Hopeless 0 0 0 0 0  PHQ - 2 Score 0 0 0 0 0   Hypertension: BP Readings from Last 3 Encounters:  12/25/17 104/68  05/15/17 100/70  03/06/17 120/84   Obesity: Wt Readings from Last 3 Encounters:  12/25/17 160 lb 3.2 oz (72.7 kg)  05/15/17 157 lb 8 oz (71.4 kg)  03/06/17 157 lb (71.2 kg)   BMI Readings from Last 3 Encounters:  12/25/17 26.66 kg/m  05/15/17 26.21 kg/m  03/06/17 26.13 kg/m     STD testing and prevention (HIV/chl/gon/syphilis): she wants to have labs done  Intimate partner violence: negative screen  Sexual History/Pain during Intercourse: no pain or bleeding  Menstrual History/LMP/Abnormal Bleeding: s/p hysterectomy for DUB Incontinence Symptoms: improved with PT   Advanced Care Planning: A voluntary discussion about advance care planning including the explanation and discussion of advance directives.  Discussed health care proxy and Living will, and the patient was able to identify a health care proxy as mother.  Patient does not  have a living will at present time.    Breast cancer: due for repeat    BRCA gene screening: N/A Cervical cancer screening: high risk HPV last time, we will recheck today   Osteoporosis Screening:  No results found for: HMDEXASCAN  Lipids:  Lab Results  Component Value Date   CHOL 296 (H) 07/30/2016   CHOL 229 (H) 02/07/2016   Lab Results  Component Value Date   HDL 76 07/30/2016    HDL 65 02/07/2016   Lab Results  Component Value Date   LDLCALC 198 (H) 07/30/2016   LDLCALC 149 (H) 02/07/2016   Lab Results  Component Value Date   TRIG 109 07/30/2016   TRIG 77 02/07/2016   Lab Results  Component Value Date   CHOLHDL 3.9 07/30/2016   CHOLHDL 3.5 02/07/2016   No results found for: LDLDIRECT  Glucose:  Glucose  Date Value Ref Range Status  07/30/2016 115 (H) 65 - 99 mg/dL Final  02/07/2016 94 65 - 99 mg/dL Final  05/08/2011 103 (H) 65 - 99 mg/dL Final   Glucose, Bld  Date Value Ref Range Status  05/11/2015 94 65 - 99 mg/dL Final    Skin cancer: discussed atypical lesions  Colorectal cancer: discussed guidelines and she will contact insurance to find out about coverage starting earlier for AA patients.  [id number]  Patient Active Problem List   Diagnosis Date Noted  . 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 Bee sting allergy 07/28/2015  . Dyslipidemia 11/02/2014  . Migraine without aura and responsive to treatment 11/02/2014  . Overweight 11/02/2014  . Vitamin D deficiency 11/02/2014  . Urinary incontinence in female 11/02/2014    Past Surgical History:  Procedure Laterality Date  . ABDOMINAL HYSTERECTOMY    . TUBAL LIGATION  2009    Family History  Problem Relation  Age of Onset  . Hypertension Mother   . Hyperlipidemia Mother   . Diabetes Father     Social History   Socioeconomic History  . Marital status: Significant Other    Spouse name: [place]   . Number of children: 2  . Years of education: Not on file  . Highest education level: High school graduate  Occupational History  . Occupation: Warehouse manager: [employer]  . Financial resource strain: Not hard at all  . Food insecurity:    Worry: Never true    Inability: Never true  . Transportation needs:    Medical: No    Non-medical: No  Tobacco Use  . Smoking status: Never Smoker  . Smokeless tobacco:  Never Used  Substance and Sexual Activity  . Alcohol use: Yes    Comment: occasionally  . Drug use: No  . Sexual activity: Yes    Partners: Male    Birth control/protection: Surgical  Lifestyle  . Physical activity:    Days per week: 1 day    Minutes per session: 30 min  . Stress: To some extent  Relationships  . Social connections:    Talks on phone: More than three times a week    Gets together: Once a week    Attends religious service: More than 4 times per year    Active member of club or organization: No    Attends meetings of clubs or organizations: Never    Relationship status: Living with partner  . Intimate partner violence:    Fear of current or ex partner: No    Emotionally abused: No    Physically abused: No    Forced sexual activity: No  Other Topics Concern  . Not on file  Social History Narrative   She worked for [employer] in [employer] Operating [employer] for 21 years. She started at [employer] April 2019   She has been living with boyfriend ( together for the past 8 years)   He was previously married, and he  has twins from previously relationship.    Her two children, are grown now. A son and a daughter.      Current Outpatient Medications:  .  EPINEPHrine (EPIPEN 2-PAK) 0.3 mg/0.3 mL IJ SOAJ injection, Inject 0.3 mLs (0.3 mg total) into the muscle once., Disp: 2 Device, Rfl: 0 .  Cholecalciferol (VITAMIN D) 2000 units CAPS, Take 1 capsule (2,000 Units total) by mouth daily., Disp: 30 capsule, Rfl: 0  Allergies  Allergen Reactions  . Tea Hives  . Bee Venom     Bee      ROS  Constitutional: Negative for fever or weight change.  Respiratory: Negative for cough and shortness of breath.   Cardiovascular: Negative for chest pain or palpitations.  Gastrointestinal: Negative for abdominal pain, no bowel changes.  Musculoskeletal: Negative for gait problem or joint swelling.  Skin: Negative for rash.  Neurological: Negative for dizziness or headache.  No other  specific complaints in a complete review of systems (except as listed in HPI above).  Objective  Vitals:   12/25/17 1317  BP: 104/68  Pulse: 89  Resp: 16  Temp: 98.4 F (36.9 C)  TempSrc: Oral  SpO2: 99%  Weight: 160 lb 3.2 oz (72.7 kg)  Height: 5' 5"  (1.651 m)    Body mass index is 26.66 kg/m.  Physical Exam  Constitutional: Patient appears well-developed and well-nourished. No distress.  HENT: Head: Normocephalic and atraumatic. Ears:  B TMs ok, no erythema or effusion; Nose: Nose normal. Mouth/Throat: Oropharynx is clear and moist. No oropharyngeal exudate.  Eyes: Conjunctivae and EOM are normal. Pupils are equal, round, and reactive to light. No scleral icterus.  Neck: Normal range of motion. Neck supple. No JVD present. No thyromegaly present.  Cardiovascular: Normal rate, regular rhythm and normal heart sounds.  No murmur heard. No BLE edema. Pulmonary/Chest: Effort normal and breath sounds normal. No respiratory distress. Abdominal: Soft. Bowel sounds are normal, no distension. There is no tenderness. no masses Breast: no lumps or masses, no nipple discharge or rashes FEMALE GENITALIA:  External genitalia normal External urethra normal Vaginal vault normal without discharge or lesions Cervix normal without discharge or lesions Bimanual exam normal without masses RECTAL:not done  Musculoskeletal: Normal range of motion, no joint effusions. No gross deformities Neurological: he is alert and oriented to person, place, and time. No cranial nerve deficit. Coordination, balance, strength, speech and gait are normal.  Skin: Skin is warm and dry. No rash noted. No erythema.  Psychiatric: Patient has a normal mood and affect. behavior is normal. Judgment and thought content normal.  PHQ2/9: Depression screen [facility] 2/9 12/25/2017 07/30/2016 02/07/2016 07/28/2015 11/02/2014  Decreased Interest 0 0 0 0 0  Down, Depressed, Hopeless 0 0 0 0 0  PHQ - 2 Score 0 0 0 0 0    Fall  Risk: Fall Risk  12/25/2017 07/30/2016 02/07/2016 07/28/2015 11/02/2014  Falls in the past year? No No No No No    Functional Status Survey: Is the patient deaf or have difficulty hearing?: No Does the patient have difficulty seeing, even when wearing glasses/contacts?: Yes(glasses and contacts) Does the patient have difficulty concentrating, remembering, or making decisions?: No Does the patient have difficulty walking or climbing stairs?: No Does the patient have difficulty dressing or bathing?: No Does the patient have difficulty doing errands alone such as visiting a doctor's office or shopping?: No   Assessment & Plan  1. Well woman exam  - COMPLETE METABOLIC PANEL WITH GFR - CBC with Differential/Platelet - Hemoglobin A1c - HIV antibody - Lipid panel - Cytology - PAP - RPR - Hepatitis, Acute  2. Need for immunization against influenza  She will have it done at work , working at [employer]  3. Cervical high risk HPV (human papillomavirus) test positive  - Lipid panel  4. Breast cancer screening  - MM 3D SCREEN BREAST BILATERAL  5. Cervical cancer screening  - Cytology - PAP  6. Routine screening for STI (sexually transmitted infection)  - Hemoglobin A1c - HIV antibody - RPR - Hepatitis, Acute  7. Vitamin D deficiency  - Cholecalciferol (VITAMIN D) 2000 units CAPS; Take 1 capsule (2,000 Units total) by mouth daily.  Dispense: 30 capsule; Refill: 0   -USPSTF grade A and B recommendations reviewed with patient; age-appropriate recommendations, preventive care, screening tests, etc discussed and encouraged; healthy living encouraged; see AVS for patient education given to patient -Discussed importance of 150 minutes of physical activity weekly, eat two servings of fish weekly, eat one serving of tree nuts ( cashews, pistachios, pecans, almonds.Marland Kitchen) every other day, eat 6 servings of fruit/vegetables daily and drink plenty of water and avoid sweet beverages.

## 2017-12-25 NOTE — Patient Instructions (Signed)
Please contact insurance to find out when you can start having colonoscopy/cologuard - colon cancer screening. Please let them you are AA female.     Preventive Care 40-64 Years, Female   Preventive care refers to lifestyle choices and visits with your health care provider that can promote health and wellness. What does preventive care include?  A yearly physical exam. This is also called an annual well check.  Dental exams once or twice a year.  Routine eye exams. Ask your health care provider how often you should have your eyes checked.  Personal lifestyle choices, including: ? Daily care of your teeth and gums. ? Regular physical activity. ? Eating a healthy diet. ? Avoiding tobacco and drug use. ? Limiting alcohol use. ? Practicing safe sex. ? Taking low-dose aspirin daily starting at age 37. ? Taking vitamin and mineral supplements as recommended by your health care provider. What happens during an annual well check? The services and screenings done by your health care provider during your annual well check will depend on your age, overall health, lifestyle risk factors, and family history of disease. Counseling Your health care provider may ask you questions about your:  Alcohol use.  Tobacco use.  Drug use.  Emotional well-being.  Home and relationship well-being.  Sexual activity.  Eating habits.  Work and work Statistician.  Method of birth control.  Menstrual cycle.  Pregnancy history.  Screening You may have the following tests or measurements:  Height, weight, and BMI.  Blood pressure.  Lipid and cholesterol levels. These may be checked every 5 years, or more frequently if you are over 90 years old.  Skin check.  Lung cancer screening. You may have this screening every year starting at age 37 if you have a 30-pack-year history of smoking and currently smoke or have quit within the past 15 years.  Fecal occult blood test (FOBT) of the stool.  You may have this test every year starting at age 45.  Flexible sigmoidoscopy or colonoscopy. You may have a sigmoidoscopy every 5 years or a colonoscopy every 10 years starting at age 37.  Hepatitis C blood test.  Hepatitis B blood test.  Sexually transmitted disease (STD) testing.  Diabetes screening. This is done by checking your blood sugar (glucose) after you have not eaten for a while (fasting). You may have this done every 1-3 years.  Mammogram. This may be done every 1-2 years. Talk to your health care provider about when you should start having regular mammograms. This may depend on whether you have a family history of breast cancer.  BRCA-related cancer screening. This may be done if you have a family history of breast, ovarian, tubal, or peritoneal cancers.  Pelvic exam and Pap test. This may be done every 3 years starting at age 21. Starting at age 11, this may be done every 5 years if you have a Pap test in combination with an HPV test.  Bone density scan. This is done to screen for osteoporosis. You may have this scan if you are at high risk for osteoporosis.  Discuss your test results, treatment options, and if necessary, the need for more tests with your health care provider. Vaccines Your health care provider may recommend certain vaccines, such as:  Influenza vaccine. This is recommended every year.  Tetanus, diphtheria, and acellular pertussis (Tdap, Td) vaccine. You may need a Td booster every 10 years.  Varicella vaccine. You may need this if you have not been vaccinated.  Zoster  vaccine. You may need this after age 5.  Measles, mumps, and rubella (MMR) vaccine. You may need at least one dose of MMR if you were born in 1957 or later. You may also need a second dose.  Pneumococcal 13-valent conjugate (PCV13) vaccine. You may need this if you have certain conditions and were not previously vaccinated.  Pneumococcal polysaccharide (PPSV23) vaccine. You may need  one or two doses if you smoke cigarettes or if you have certain conditions.  Meningococcal vaccine. You may need this if you have certain conditions.  Hepatitis A vaccine. You may need this if you have certain conditions or if you travel or work in places where you may be exposed to hepatitis A.  Hepatitis B vaccine. You may need this if you have certain conditions or if you travel or work in places where you may be exposed to hepatitis B.  Haemophilus influenzae type b (Hib) vaccine. You may need this if you have certain conditions.  Talk to your health care provider about which screenings and vaccines you need and how often you need them. This information is not intended to replace advice given to you by your health care provider. Make sure you discuss any questions you have with your health care provider. Document Released: 05/06/2015 Document Revised: 12/28/2015 Document Reviewed: 02/08/2015 Elsevier Interactive Patient Education  [employer].

## 2017-12-26 LAB — CBC WITH DIFFERENTIAL/PLATELET
BASOS ABS: 38 {cells}/uL (ref 0–200)
Basophils Relative: 0.7 %
EOS PCT: 1.1 %
Eosinophils Absolute: 59 cells/uL (ref 15–500)
HCT: 41.2 % (ref 35.0–45.0)
HEMOGLOBIN: 14 g/dL (ref 11.7–15.5)
Lymphs Abs: 1247 cells/uL (ref 850–3900)
MCH: 29.9 pg (ref 27.0–33.0)
MCHC: 34 g/dL (ref 32.0–36.0)
MCV: 88 fL (ref 80.0–100.0)
MONOS PCT: 7.3 %
MPV: 9 fL (ref 7.5–12.5)
NEUTROS ABS: 3661 {cells}/uL (ref 1500–7800)
NEUTROS PCT: 67.8 %
PLATELETS: 343 10*3/uL (ref 140–400)
RBC: 4.68 10*6/uL (ref 3.80–5.10)
RDW: 12.7 % (ref 11.0–15.0)
TOTAL LYMPHOCYTE: 23.1 %
WBC mixed population: 394 cells/uL (ref 200–950)
WBC: 5.4 10*3/uL (ref 3.8–10.8)

## 2017-12-26 LAB — HEPATITIS PANEL, ACUTE
HEP B S AG: NONREACTIVE
Hep A IgM: NONREACTIVE
Hep B C IgM: NONREACTIVE
Hepatitis C Ab: NONREACTIVE
SIGNAL TO CUT-OFF: 0.01 (ref <1.00)

## 2017-12-26 LAB — COMPLETE METABOLIC PANEL WITH GFR
AG RATIO: 1.9 (calc) (ref 1.0–2.5)
ALT: 6 U/L (ref 6–29)
AST: 10 U/L (ref 10–30)
Albumin: 4.5 g/dL (ref 3.6–5.1)
Alkaline phosphatase (APISO): 43 U/L (ref 33–115)
BILIRUBIN TOTAL: 0.4 mg/dL (ref 0.2–1.2)
BUN: 8 mg/dL (ref 7–25)
CALCIUM: 9.7 mg/dL (ref 8.6–10.2)
CO2: 30 mmol/L (ref 20–32)
Chloride: 104 mmol/L (ref 98–110)
Creat: 1.03 mg/dL (ref 0.50–1.10)
GFR, EST NON AFRICAN AMERICAN: 66 mL/min/{1.73_m2} (ref >=60)
GFR, Est African American: 77 mL/min/{1.73_m2} (ref >=60)
GLOBULIN: 2.4 g/dL (ref 1.9–3.7)
Glucose, Bld: 78 mg/dL (ref 65–139)
POTASSIUM: 4.1 mmol/L (ref 3.5–5.3)
SODIUM: 140 mmol/L (ref 135–146)
Total Protein: 6.9 g/dL (ref 6.1–8.1)

## 2017-12-26 LAB — HEMOGLOBIN A1C
Hgb A1c MFr Bld: 5.3 % of total Hgb (ref <5.7)
MEAN PLASMA GLUCOSE: 105 (calc)
eAG (mmol/L): 5.8 (calc)

## 2017-12-26 LAB — LIPID PANEL
CHOL/HDL RATIO: 3.8 (calc) (ref <5.0)
Cholesterol: 245 mg/dL — ABNORMAL HIGH (ref <200)
HDL: 65 mg/dL (ref >50)
LDL Cholesterol (Calc): 161 mg/dL (calc) — ABNORMAL HIGH
NON-HDL CHOLESTEROL (CALC): 180 mg/dL — AB (ref <130)
TRIGLYCERIDES: 88 mg/dL (ref <150)

## 2017-12-26 LAB — HIV ANTIBODY (ROUTINE TESTING W REFLEX): HIV 1&2 Ab, 4th Generation: NONREACTIVE

## 2017-12-26 LAB — RPR: RPR Ser Ql: NONREACTIVE

## 2017-12-27 LAB — CYTOLOGY - PAP
Bacterial vaginitis: POSITIVE — AB
Candida vaginitis: POSITIVE — AB
Chlamydia: NEGATIVE
Diagnosis: NEGATIVE
HPV: DETECTED — AB
Neisseria Gonorrhea: NEGATIVE
Trichomonas: NEGATIVE

## 2018-03-26 ENCOUNTER — Ambulatory Visit: Payer: Commercial Managed Care - PPO | Admitting: Family Medicine

## 2018-07-03 ENCOUNTER — Ambulatory Visit: Payer: Self-pay

## 2018-07-03 NOTE — Telephone Encounter (Signed)
Pt c/o heart fluttering and feels like heart is beating out of chest. Pt c/o intermittent episodes of chest disconfort when taking a deep breath in. Symptoms last 5-10 minutes and then goes away. HR during call 68 nd rechecked it and was 76 per minute.  Pt has been ungoing a lot of work stress and anxiety. Pt has been evaluated in the past for similar issue and was given medication for anxiety and symptoms resolved. Disposition is to send to ED or PCP triage: called practice and spoke with Dr Ancil Boozer who stated she will see her in the office tomorrow. Appt made. Pt advised to ER for worsening chest pain, sweating, chest pain with radiation to arms, back across chest or through to the back. Pt episodes last loner or are more severe or are occurring more frequently. Pt verbalized understanding. Care advice given and pt verbalized understanding.  Reason for Disposition . New or worsened shortness of breath with activity (dyspnea on exertion) . [1] Chest pain lasting <= 5 minutes AND [2] NO chest pain or cardiac symptoms now(Exceptions: pains lasting a few seconds)  Answer Assessment - Initial Assessment Questions 1. DESCRIPTION: "Please describe your heart rate or heart beat that you are having" (e.g., fast/slow, regular/irregular, skipped or extra beats, "palpitations")     Fast, beating out of chest, "fluttering" feels like "water running around heart." woke up sweating last night - 2. ONSET: "When did it start?" (Minutes, hours or days)      Sunday 3. DURATION: "How long does it last" (e.g., seconds, minutes, hours)    1 hour 4. PATTERN "Does it come and go, or has it been constant since it started?"  "Does it get worse with exertion?"   "Are you feeling it now?"     Comes and goes-worse with exertion, -yes feeling it now 5. TAP: "Using your hand, can you tap out what you are feeling on a chair or table in front of you, so that I can hear?" (Note: not all patients can do this)       Regular beat no  skipping 6. HEART RATE: "Can you tell me your heart rate?" "How many beats in 15 seconds?"  (Note: not all patients can do this)       68  and rechecked 76 per minute 7. RECURRENT SYMPTOM: "Have you ever had this before?" If so, ask: "When was the last time?" and "What happened that time?"      Yes-7-8 years ago-wore Holter for 24 hours-put on a medication for anxiety and migraine nothing specific to heart rate 8. CAUSE: "What do you think is causing the palpitations?"     Anxiety, 2 soda per day 9. CARDIAC HISTORY: "Do you have any history of heart disease?" (e.g., heart attack, angina, bypass surgery, angioplasty, arrhythmia)      no 10. OTHER SYMPTOMS: "Do you have any other symptoms?" (e.g., dizziness, chest pain, sweating, difficulty breathing)       Woke up sweating early this morning, chest pain, hurts to take a deep breath in 11. PREGNANCY: "Is there any chance you are pregnant?" "When was your last menstrual period?"       N/a LMP n/a  Answer Assessment - Initial Assessment Questions 1. LOCATION: "Where does it hurt?"       Top of breast bone  2. RADIATION: "Does the pain go anywhere else?" (e.g., into neck, jaw, arms, back)     no 3. ONSET: "When did the chest pain begin?" (Minutes, hours or  days)      Sunday 4. PATTERN "Does the pain come and go, or has it been constant since it started?"  "Does it get worse with exertion?"      Comes and goes- no 5. DURATION: "How long does it last" (e.g., seconds, minutes, hours)     5- 10 minutes 6. SEVERITY: "How bad is the pain?"  (e.g., Scale 1-10; mild, moderate, or severe)    - MILD (1-3): doesn't interfere with normal activities     - MODERATE (4-7): interferes with normal activities or awakens from sleep    - SEVERE (8-10): excruciating pain, unable to do any normal activities       5 eases when standing or walking around 7. CARDIAC RISK FACTORS: "Do you have any history of heart problems or risk factors for heart disease?" (e.g.,  prior heart attack, angina; high blood pressure, diabetes, being overweight, high cholesterol, smoking, or strong family history of heart disease)     Has been evaluated in the past for fast HR wore a Holter monitor. Was given medication for anxiety but no heart issue  8. PULMONARY RISK FACTORS: "Do you have any history of lung disease?"  (e.g., blood clots in lung, asthma, emphysema, birth control pills)     no 9. CAUSE: "What do you think is causing the chest pain?"     Pt doesn't know 10. OTHER SYMPTOMS: "Do you have any other symptoms?" (e.g., dizziness, nausea, vomiting, sweating, fever, difficulty breathing, cough)       *No Answer* 11. PREGNANCY: "Is there any chance you are pregnant?" "When was your last menstrual period?"       *No Answer*  Protocols used: HEART RATE AND HEARTBEAT QUESTIONS-A-AH, CHEST PAIN-A-AH

## 2018-07-04 ENCOUNTER — Encounter: Payer: Self-pay | Admitting: Family Medicine

## 2018-07-04 ENCOUNTER — Other Ambulatory Visit: Payer: Self-pay

## 2018-07-04 ENCOUNTER — Ambulatory Visit (INDEPENDENT_AMBULATORY_CARE_PROVIDER_SITE_OTHER): Payer: Commercial Managed Care - PPO | Admitting: Family Medicine

## 2018-07-04 ENCOUNTER — Ambulatory Visit: Payer: Commercial Managed Care - PPO | Admitting: Family Medicine

## 2018-07-04 VITALS — BP 120/80 | HR 82 | Temp 98.0°F | Resp 16 | Ht 65.0 in | Wt 166.3 lb

## 2018-07-04 DIAGNOSIS — F411 Generalized anxiety disorder: Secondary | ICD-10-CM | POA: Diagnosis not present

## 2018-07-04 DIAGNOSIS — E785 Hyperlipidemia, unspecified: Secondary | ICD-10-CM | POA: Diagnosis not present

## 2018-07-04 DIAGNOSIS — R002 Palpitations: Secondary | ICD-10-CM | POA: Diagnosis not present

## 2018-07-04 MED ORDER — ESCITALOPRAM OXALATE 10 MG PO TABS: 10.0000 mg | ORAL_TABLET | Freq: Every day | ORAL | 1 refills | Status: DC

## 2018-07-04 MED ORDER — HYDROXYZINE HCL 10 MG PO TABS: 10.0000 mg | ORAL_TABLET | Freq: Three times a day (TID) | ORAL | 0 refills | Status: DC | PRN

## 2018-07-04 NOTE — Progress Notes (Signed)
Name: Tanya Robinson   MRN: [id number]    DOB: 1975-04-26   Date:07/04/2018       Progress Note  Subjective  Chief Complaint  Chief Complaint  Patient presents with  . Palpitations    HPI  Palpitation: she changed positions at work [employer]) about 8 days ago. She has to do her position and fill in the position of another co-worker. She is feeling overwhelmed. Also worried about her father that has been sick, her daughter is 45 yo and moved out of the house about one month ago. She worries about her because she has ADD and moved quickly and worried she will not be able to manage life on her own. Son is 76 yo and worries that he is not motivated enough. Graduated HS and plays games instead of working or going to school. She was at home 5 days - it was the weekend - she finished her shoulder and felt palpitation, difficulty breathing, chest pain described as sharp sensation.  Chest pain lasted about one hour, she states that since Sunday she has nervous feeling inside " like when you get a phone call with bad news"   Hot flashes: s/p hysterectomy, still has ovaries, noticing night sweats and hot flashes for the past month   Patient Active Problem List   Diagnosis Date Noted  . 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 Bee sting allergy 07/28/2015  . Dyslipidemia 11/02/2014  . Migraine without aura and responsive to treatment 11/02/2014  . Overweight 11/02/2014  . Vitamin D deficiency 11/02/2014  . Urinary incontinence in female 11/02/2014    Past Surgical History:  Procedure Laterality Date  . ABDOMINAL HYSTERECTOMY    . TUBAL LIGATION  2009    Family History  Problem Relation Age of Onset  . Hypertension Mother   . Hyperlipidemia Mother   . Diabetes Father     Social History   Socioeconomic History  . Marital status: Significant Other    Spouse name: Niue   . Number of children: 2  . Years of education: Not on file  . Highest education level:  High school graduate  Occupational History  . Occupation: Warehouse manager: [employer]  . Financial resource strain: Not hard at all  . Food insecurity:    Worry: Never true    Inability: Never true  . Transportation needs:    Medical: No    Non-medical: No  Tobacco Use  . Smoking status: Never Smoker  . Smokeless tobacco: Never Used  Substance and Sexual Activity  . Alcohol use: Yes    Comment: occasionally  . Drug use: No  . Sexual activity: Yes    Partners: Male    Birth control/protection: Surgical  Lifestyle  . Physical activity:    Days per week: 1 day    Minutes per session: 30 min  . Stress: To some extent  Relationships  . Social connections:    Talks on phone: More than three times a week    Gets together: Once a week    Attends religious service: More than 4 times per year    Active member of club or organization: No    Attends meetings of clubs or organizations: Never    Relationship status: Living with partner  . Intimate partner violence:    Fear of current or ex partner: No    Emotionally abused: No    Physically abused:  No    Forced sexual activity: No  Other Topics Concern  . 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 She started at [employer] April 2019   She has been living with boyfriend ( together for the past 8 years)   He was previously married, and he  has twins from previously relationship.    Her two children, are grown now. A son and a daughter.      Current Outpatient Medications:  .  Cholecalciferol (VITAMIN D) 2000 units CAPS, Take 1 capsule (2,000 Units total) by mouth daily., Disp: 30 capsule, Rfl: 0 .  EPINEPHrine (EPIPEN 2-PAK) 0.3 mg/0.3 mL IJ SOAJ injection, Inject 0.3 mLs (0.3 mg total) into the muscle once., Disp: 2 Device, Rfl: 0  Allergies  Allergen Reactions  . Tea Hives  . Bee Venom     Bee    I personally reviewed active problem  list, medication list, allergies, family history, social history with the patient/caregiver today.   ROS  Constitutional: Negative for fever or weight change.  Respiratory: Negative for cough and shortness of breath.   Cardiovascular: Negative for chest pain or palpitations.  Gastrointestinal: Negative for abdominal pain, no bowel changes.  Musculoskeletal: Negative for gait problem or joint swelling.  Skin: Negative for rash.  Neurological: Negative for dizziness or headache.  No other specific complaints in a complete review of systems (except as listed in HPI above).  Objective  Vitals:   07/04/18 1325  BP: 120/80  Pulse: 82  Resp: 16  Temp: 98 F (36.7 C)  TempSrc: Oral  SpO2: 96%  Weight: 166 lb 4.8 oz (75.4 kg)  Height: 5' 5"  (1.651 m)    Body mass index is 27.67 kg/m.  Physical Exam  Constitutional: Patient appears well-developed and well-nourished. Obese  No distress.  HEENT: head atraumatic, normocephalic, pupils equal and reactive to light,  neck supple, throat within normal limits Cardiovascular: Normal rate, regular rhythm and normal heart sounds.  No murmur heard. No BLE edema. Pulmonary/Chest: Effort normal and breath sounds normal. No respiratory distress. Abdominal: Soft.  There is no tenderness. Psychiatric: Patient has a normal mood and affect. behavior is normal. Judgment and thought content normal.  PHQ2/9: Depression screen [facility] 2/9 07/04/2018 12/25/2017 07/30/2016 02/07/2016 07/28/2015  Decreased Interest 1 0 0 0 0  Down, Depressed, Hopeless 0 0 0 0 0  PHQ - 2 Score 1 0 0 0 0  Altered sleeping 1 - - - -  Tired, decreased energy 1 - - - -  Change in appetite 1 - - - -  Feeling bad or failure about yourself  0 - - - -  Trouble concentrating 1 - - - -  Moving slowly or fidgety/restless 0 - - - -  Suicidal thoughts 0 - - - -  PHQ-9 Score 5 - - - -  Difficult doing work/chores Somewhat difficult - - - -   Positive phq9   GAD 7 : Generalized Anxiety  Score 07/04/2018 02/07/2016  Nervous, Anxious, on Edge 3 2  Control/stop worrying 3 2  Worry too much - different things 3 3  Trouble relaxing 3 0  Restless 2 0  Easily annoyed or irritable 3 3  Afraid - awful might happen 3 2  Total GAD 7 Score 20 12  Anxiety Difficulty - Somewhat difficult   Very high anxiety   Fall Risk: Fall Risk  07/04/2018 12/25/2017 07/30/2016 02/07/2016 07/28/2015  Falls  in the past year? 0 No No No No  Number falls in past yr: 0 - - - -  Injury with Fall? 0 - - - -    Assessment & Plan  1. Palpitation  - COMPLETE METABOLIC PANEL WITH GFR - CBC with Differential/Platelet - TSH - EKG 12-Lead - normal sinus rhythm   2. Dyslipidemia  - Lipid panel  3. GAD (generalized anxiety disorder)  - escitalopram (LEXAPRO) 10 MG tablet; Take 1 tablet (10 mg total) by mouth daily.  Dispense: 30 tablet; Refill: 1

## 2018-12-05 ENCOUNTER — Ambulatory Visit (INDEPENDENT_AMBULATORY_CARE_PROVIDER_SITE_OTHER): Payer: Commercial Managed Care - PPO | Admitting: Family Medicine

## 2018-12-05 ENCOUNTER — Other Ambulatory Visit: Payer: Self-pay

## 2018-12-05 ENCOUNTER — Encounter: Payer: Self-pay | Admitting: Family Medicine

## 2018-12-05 VITALS — BP 122/66 | HR 86 | Temp 97.6°F | Resp 16 | Ht 65.0 in | Wt 175.6 lb

## 2018-12-05 DIAGNOSIS — M25511 Pain in right shoulder: Secondary | ICD-10-CM

## 2018-12-05 MED ORDER — MELOXICAM 15 MG PO TABS: 15.0000 mg | ORAL_TABLET | Freq: Every day | ORAL | 0 refills | Status: DC

## 2018-12-05 MED ORDER — TIZANIDINE HCL 4 MG PO TABS: 2.0000 mg | ORAL_TABLET | Freq: Three times a day (TID) | ORAL | 0 refills | Status: DC | PRN

## 2018-12-05 NOTE — Progress Notes (Signed)
Name: Tanya Robinson   MRN: [id number]    DOB: Sep 16, 1975   Date:12/05/2018       Progress Note  Subjective  Chief Complaint  Chief Complaint  Patient presents with  . Shoulder Pain    patient presents with right shoulder pain for the past month. she was working out and heard a pop. has tried multiple otc medications. ice and heat. patient radiates from right shoulder blade to elbow. throbbing and very painful    HPI Pt presents with right shoulder pain for 1 month that started after she reached her arms up while yawning. At the time she felt several "pops".  She has since lost a lot of her AROM and has a lot of pain with AROM - especially forward motions.  She has tried icy hot with lidocaine, aleve, ibuprofen, ice, heat all without relief of her pain.  She points to her posterior shoulder as the location of the most pain.  No history of injury to the site aside from the above described event. Had one episode of tingling in the right arm after sleeping on that side.  Patient Active Problem List   Diagnosis Date Noted  . 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 Bee sting allergy 07/28/2015  . Dyslipidemia 11/02/2014  . Migraine without aura and responsive to treatment 11/02/2014  . Overweight 11/02/2014  . Vitamin D deficiency 11/02/2014  . Urinary incontinence in female 11/02/2014    Social History   Tobacco Use  . Smoking status: Never Smoker  . Smokeless tobacco: Never Used  Substance Use Topics  . Alcohol use: Yes    Comment: occasionally     Current Outpatient Medications:  .  Cholecalciferol (VITAMIN D) 2000 units CAPS, Take 1 capsule (2,000 Units total) by mouth daily., Disp: 30 capsule, Rfl: 0 .  EPINEPHrine (EPIPEN 2-PAK) 0.3 mg/0.3 mL IJ SOAJ injection, Inject 0.3 mLs (0.3 mg total) into the muscle once., Disp: 2 Device, Rfl: 0 .  escitalopram (LEXAPRO) 10 MG tablet, Take 1 tablet (10 mg total) by mouth daily., Disp: 30 tablet, Rfl:  1 .  hydrOXYzine (ATARAX/VISTARIL) 10 MG tablet, Take 1 tablet (10 mg total) by mouth 3 (three) times daily as needed., Disp: 30 tablet, Rfl: 0  Allergies  Allergen Reactions  . Tea Hives  . Bee Venom     Bee    I personally reviewed active problem list, medication list, allergies, notes from last encounter, lab results with the patient/caregiver today.  ROS  Ten systems reviewed and is negative except as mentioned in HPI  Objective  Vitals:   12/05/18 1323  BP: 122/66  Pulse: 86  Resp: 16  Temp: 97.6 F (36.4 C)  TempSrc: Oral  SpO2: 99%  Weight: 175 lb 9.6 oz (79.7 kg)  Height: 5' 5"  (1.651 m)    Body mass index is 29.22 kg/m.  Nursing Note and Vital Signs reviewed.  Physical Exam  Constitutional: Patient appears well-developed and well-nourished. No distress.  HENT: Head: Normocephalic and atraumatic.  Eyes: Conjunctivae and EOM are normal. No scleral icterus.   Neck: Normal range of motion. Neck supple. No JVD present. No thyromegaly present.  Cardiovascular: Normal rate, regular rhythm and normal heart sounds.  No murmur heard. No BLE edema. Pulmonary/ChesRIGHT shoulder has decreased AROM, decreased with forward motion, and adduction, tenderness on the right trapezius.  There is no crepitus. Neurological: Pt is alert and oriented to person, place, and time. No cranial nerve deficit.  Coordination, balance, strength, speech and gait are normal.  Skin: Skin is warm and dry. No rash noted. No erythema.  Psychiatric: Patient has a normal mood and affect. behavior is normal. Judgment and thought content normal.  No results found for this or any previous visit (from the past 72 hour(s)).  Assessment & Plan  1. Acute pain of right shoulder - Ambulatory referral to Orthopedics - meloxicam (MOBIC) 15 MG tablet; Take 1 tablet (15 mg total) by mouth daily.  Dispense: 15 tablet; Refill: 0 - tiZANidine (ZANAFLEX) 4 MG tablet; Take 0.5-1 tablets (2-4 mg total) by mouth  every 8 (eight) hours as needed for muscle spasms.  Dispense: 30 tablet; Refill: 0  -Red flags and when to present for emergency care or RTC including fever >101.65F, chest pain, shortness of breath, new/worsening/un-resolving symptoms, reviewed with patient at time of visit. Follow up and care instructions discussed and provided in AVS.

## 2019-08-03 ENCOUNTER — Ambulatory Visit (INDEPENDENT_AMBULATORY_CARE_PROVIDER_SITE_OTHER): Payer: Commercial Managed Care - PPO | Admitting: Family Medicine

## 2019-08-03 ENCOUNTER — Other Ambulatory Visit: Payer: Self-pay

## 2019-08-03 ENCOUNTER — Other Ambulatory Visit (HOSPITAL_COMMUNITY)
Admission: RE | Admit: 2019-08-03 | Discharge: 2019-08-03 | Disposition: A | Discharge status: Home / Self Care | Payer: Commercial Managed Care - PPO | Source: Ambulatory Visit | Attending: Family Medicine | Admitting: Family Medicine

## 2019-08-03 ENCOUNTER — Encounter: Payer: Self-pay | Admitting: Family Medicine

## 2019-08-03 VITALS — BP 110/80 | HR 75 | Temp 96.9°F | Resp 16 | Ht 65.5 in | Wt 175.6 lb

## 2019-08-03 DIAGNOSIS — Z0000 Encounter for general adult medical examination without abnormal findings: Secondary | ICD-10-CM | POA: Diagnosis not present

## 2019-08-03 DIAGNOSIS — R87810 Cervical high risk human papillomavirus (HPV) DNA test positive: Secondary | ICD-10-CM

## 2019-08-03 DIAGNOSIS — E785 Hyperlipidemia, unspecified: Secondary | ICD-10-CM

## 2019-08-03 DIAGNOSIS — Z131 Encounter for screening for diabetes mellitus: Secondary | ICD-10-CM

## 2019-08-03 DIAGNOSIS — F341 Dysthymic disorder: Secondary | ICD-10-CM

## 2019-08-03 DIAGNOSIS — Z1231 Encounter for screening mammogram for malignant neoplasm of breast: Secondary | ICD-10-CM

## 2019-08-03 DIAGNOSIS — E559 Vitamin D deficiency, unspecified: Secondary | ICD-10-CM | POA: Diagnosis not present

## 2019-08-03 DIAGNOSIS — Z113 Encounter for screening for infections with a predominantly sexual mode of transmission: Secondary | ICD-10-CM

## 2019-08-03 DIAGNOSIS — Z91030 Bee allergy status: Secondary | ICD-10-CM

## 2019-08-03 DIAGNOSIS — Z1211 Encounter for screening for malignant neoplasm of colon: Secondary | ICD-10-CM

## 2019-08-03 MED ORDER — EPINEPHRINE 0.3 MG/0.3ML IJ SOAJ: 0.3000 mg | Freq: Once | INTRAMUSCULAR | 0 refills | Status: AC

## 2019-08-03 NOTE — Patient Instructions (Signed)

## 2019-08-03 NOTE — Progress Notes (Signed)
Name: Tanya Robinson   MRN: [id number]    DOB: 04-Jun-1975   Date:08/03/2019       Progress Note  Subjective  Chief Complaint  Chief Complaint  Patient presents with  . Annual Exam    HPI  Patient presents for annual CPE.  Dysthymia: going on for the past two months, still able to work, take care of her house, but has crying spells, also taking naps to not feel pain. She states her daughter lost her baby and uncle died recently. She states also isolated because of the pandemic. Discussed avoiding naps, stay outside, go for walks.   Bee sting allergy: and also tea, she has an old epipen and we will give her a refill  Diet: balanced, but she is snacking more often since working from home  Exercise: discussed 150 minutes per week    USPSTF grade A and B recommendations    Office Visit from 08/03/2019 in [facility]  AUDIT-C Score  0     Depression: Phq 9 is  Positive  Daughter had a miscarriage at 46 weeks, she is grieving the loss of her uncle that died suddenly a couple of months ago    Depression screen [facility] 2/9 08/03/2019 12/05/2018 07/04/2018 12/25/2017 07/30/2016  Decreased Interest 0 0 1 0 0  Down, Depressed, Hopeless 1 0 0 0 0  PHQ - 2 Score 1 0 1 0 0  Altered sleeping 2 0 1 - -  Tired, decreased energy 2 0 1 - -  Change in appetite 2 0 1 - -  Feeling bad or failure about yourself  0 0 0 - -  Trouble concentrating 0 0 1 - -  Moving slowly or fidgety/restless 0 0 0 - -  Suicidal thoughts 0 0 0 - -  PHQ-9 Score 7 0 5 - -  Difficult doing work/chores Not difficult at all Not difficult at all Somewhat difficult - -   Hypertension: BP Readings from Last 3 Encounters:  08/03/19 110/80  12/05/18 122/66  07/04/18 120/80   Obesity: Wt Readings from Last 3 Encounters:  08/03/19 175 lb 9.6 oz (79.7 kg)  12/05/18 175 lb 9.6 oz (79.7 kg)  07/04/18 166 lb 4.8 oz (75.4 kg)   BMI Readings from Last 3 Encounters:  08/03/19 28.78 kg/m  12/05/18 29.22 kg/m   07/04/18 27.67 kg/m     Hep C Screening: 12/2017  STD testing and prevention (HIV/chl/gon/syphilis): not interested  Intimate partner violence: negative  Sexual History (Partners/Practices/Protection from [employer] hx STI/Pregnancy Plans): not interested, same partner for many years, last screen negative  Pain during Intercourse: intermittently has a sharp pelvic pain during sex, resolves when they change position Menstrual History/LMP/Abnormal Bleeding: s/p supracervical hysterectomy  Incontinence Symptoms:  No problems   Breast cancer:  - Last Mammogram: schedule it  - BRCA gene screening: N/A  Osteoporosis: Discussed high calcium and vitamin D supplementation, weight bearing exercises  Cervical cancer screening: today   Skin cancer: Discussed monitoring for atypical lesions  Colorectal cancer: discussed options, she prefers colonoscopy  Lung cancer:   Low Dose CT Chest recommended if Age 13-80 years, 30 pack-year currently smoking OR have quit w/in 15years. Patient does not qualify.   ECG: 06/2018  Advanced Care Planning: A voluntary discussion about advance care planning including the explanation and discussion of advance directives.  Discussed health care proxy and Living will, and the patient was able to identify a health care proxy as mother .  Patient does  not have a living will at present time. If patient does have living will, I have requested they bring this to the clinic to be scanned in to their chart.  Lipids: Lab Results  Component Value Date   CHOL 245 (H) 12/25/2017   CHOL 296 (H) 07/30/2016   CHOL 229 (H) 02/07/2016   Lab Results  Component Value Date   HDL 65 12/25/2017   HDL 76 07/30/2016   HDL 65 02/07/2016   Lab Results  Component Value Date   LDLCALC 161 (H) 12/25/2017   LDLCALC 198 (H) 07/30/2016   LDLCALC 149 (H) 02/07/2016   Lab Results  Component Value Date   TRIG 88 12/25/2017   TRIG 109 07/30/2016   TRIG 77 02/07/2016   Lab Results   Component Value Date   CHOLHDL 3.8 12/25/2017   CHOLHDL 3.9 07/30/2016   CHOLHDL 3.5 02/07/2016   No results found for: LDLDIRECT  Glucose: Glucose  Date Value Ref Range Status  07/30/2016 115 (H) 65 - 99 mg/dL Final  02/07/2016 94 65 - 99 mg/dL Final  05/08/2011 103 (H) 65 - 99 mg/dL Final   Glucose, Bld  Date Value Ref Range Status  12/25/2017 78 65 - 139 mg/dL Final    Comment:    .        Non-fasting reference interval .   05/11/2015 94 65 - 99 mg/dL Final    Patient Active Problem List   Diagnosis Date Noted  . 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 Bee sting allergy 07/28/2015  . Dyslipidemia 11/02/2014  . Migraine without aura and responsive to treatment 11/02/2014  . Overweight 11/02/2014  . Vitamin D deficiency 11/02/2014  . Urinary incontinence in female 11/02/2014    Past Surgical History:  Procedure Laterality Date  . ABDOMINAL HYSTERECTOMY    . TUBAL LIGATION  2009    Family History  Problem Relation Age of Onset  . Hypertension Mother   . Hyperlipidemia Mother   . Diabetes Father     Social History   Socioeconomic History  . Marital status: Significant Other    Spouse name: [place]   . Number of children: 2  . Years of education: Not on file  . Highest education level: High school graduate  Occupational History  . Occupation: Warehouse manager: [employer]  Tobacco Use  . Smoking status: Never Smoker  . Smokeless tobacco: Never Used  Substance and Sexual Activity  . Alcohol use: Yes    Comment: occasionally  . Drug use: No  . Sexual activity: Yes    Partners: Male    Birth control/protection: Surgical  Other Topics Concern  . 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 She started at [employer] [employer] April 2019, she has been working from home since March 2020 because of pandemic    She has been living with boyfriend ( together since  2011)   He was previously married, and he has twins from previously relationship.    Her two children, are grown now. A son and a daughter.    Daughter had a miscarriage in 2021   Social Determinants of Health   Financial Resource Strain: Low Risk   . Difficulty of Paying Living Expenses: Not hard at all  Food Insecurity: No Food Insecurity  . Worried About Charity fundraiser in the Last Year: Never true  . Ran  Out of Food in the Last Year: Never true  Transportation Needs: No Transportation Needs  . Lack of Transportation (Medical): No  . Lack of Transportation (Non-Medical): No  Physical Activity: Inactive  . Days of Exercise per Week: 0 days  . Minutes of Exercise per Session: 0 min  Stress: Stress Concern Present  . Feeling of Stress : To some extent  Social Connections: Not Isolated  . Frequency of Communication with Friends and Family: More than three times a week  . Frequency of Social Gatherings with Friends and Family: More than three times a week  . Attends Religious Services: More than 4 times per year  . Active Member of Clubs or Organizations: Yes  . Attends Archivist Meetings: More than 4 times per year  . Marital Status: Living with partner  Intimate Partner Violence: Not At Risk  . Fear of Current or Ex-Partner: No  . Emotionally Abused: No  . Physically Abused: No  . Sexually Abused: No     Current Outpatient Medications:  .  Cholecalciferol (VITAMIN D) 2000 units CAPS, Take 1 capsule (2,000 Units total) by mouth daily., Disp: 30 capsule, Rfl: 0 .  EPINEPHrine (EPIPEN 2-PAK) 0.3 mg/0.3 mL IJ SOAJ injection, Inject 0.3 mLs (0.3 mg total) into the muscle once for 1 dose., Disp: 0.3 mL, Rfl: 0  Allergies  Allergen Reactions  . Tea Hives  . Bee Venom     Bee     ROS  Constitutional: Negative for fever or weight change.  Respiratory: Negative for cough and shortness of breath.   Cardiovascular: Negative for chest pain or palpitations.   Gastrointestinal: Negative for abdominal pain, no bowel changes.  Musculoskeletal: Negative for gait problem or joint swelling.  Skin: Negative for rash.  Neurological: Negative for dizziness or headache.  No other specific complaints in a complete review of systems (except as listed in HPI above).  Objective  Vitals:   08/03/19 1435  BP: 110/80  Pulse: 75  Resp: 16  Temp: (!) 96.9 F (36.1 C)  TempSrc: Temporal  SpO2: 99%  Weight: 175 lb 9.6 oz (79.7 kg)  Height: 5' 5.5" (1.664 m)    Body mass index is 28.78 kg/m.  Physical Exam  Constitutional: Patient appears well-developed and well-nourished. No distress.  HENT: Head: Normocephalic and atraumatic. Ears: B TMs ok, no erythema or effusion; Nose:  not done. Mouth/Throat: not done Eyes: Conjunctivae and EOM are normal. Pupils are equal, round, and reactive to light. No scleral icterus.  Neck: Normal range of motion. Neck supple. No JVD present. No thyromegaly present.  Cardiovascular: Normal rate, regular rhythm and normal heart sounds.  No murmur heard. No BLE edema. Pulmonary/Chest: Effort normal and breath sounds normal. No respiratory distress. Abdominal: Soft. Bowel sounds are normal, no distension. There is no tenderness. no masses Breast: no lumps or masses, no nipple discharge or rashes FEMALE GENITALIA:  External genitalia normal External urethra normal Vaginal vault normal without discharge or lesions Cervix friable  Bimanual exam normal without masses RECTAL: not done  Musculoskeletal: Normal range of motion, no joint effusions. No gross deformities Neurological: he is alert and oriented to person, place, and time. No cranial nerve deficit. Coordination, balance, strength, speech and gait are normal.  Skin: Skin is warm and dry. No rash noted. No erythema.  Psychiatric: Patient has a normal mood and affect. behavior is normal. Judgment and thought content normal.  Fall Risk: Fall Risk  12/05/2018 07/04/2018  12/25/2017 07/30/2016 02/07/2016  Falls  in the past year? 0 0 No No No  Number falls in past yr: 0 0 - - -  Injury with Fall? 0 0 - - -    Assessment & Plan  1. Well adult exam  - COMPLETE METABOLIC PANEL WITH GFR - CBC with Differential/Platelet - Hemoglobin A1c  2. Cervical high risk HPV (human papillomavirus) test positive  Check pap smear   3. Encounter for screening mammogram for malignant neoplasm of breast  - MM 3D SCREEN BREAST BILATERAL; Future  4. Dyslipidemia  - Lipid panel  5. Vitamin D deficiency  - VITAMIN D 25 Hydroxy (Vit-D Deficiency, Fractures)  6. Routine screening for STI (sexually transmitted infection)  Not interested   7. Diabetes mellitus screening  - Hemoglobin A1c  8. Colon cancer screening  - Ambulatory referral to Gastroenterology  9. Bee sting allergy  - EPINEPHrine (EPIPEN 2-PAK) 0.3 mg/0.3 mL IJ SOAJ injection; Inject 0.3 mLs (0.3 mg total) into the muscle once for 1 dose.  Dispense: 0.3 mL; Refill: 0  10. Dysthymia  Discussed therapy, exercise, sun light.   -USPSTF grade A and B recommendations reviewed with patient; age-appropriate recommendations, preventive care, screening tests, etc discussed and encouraged; healthy living encouraged; see AVS for patient education given to patient -Discussed importance of 150 minutes of physical activity weekly, eat two servings of fish weekly, eat one serving of tree nuts ( cashews, pistachios, pecans, almonds.Marland Kitchen) every other day, eat 6 servings of fruit/vegetables daily and drink plenty of water and avoid sweet beverages.

## 2019-08-04 LAB — CBC WITH DIFFERENTIAL/PLATELET
Absolute Monocytes: 429 cells/uL (ref 200–950)
Basophils Absolute: 72 cells/uL (ref 0–200)
Basophils Relative: 1.3 %
Eosinophils Absolute: 72 cells/uL (ref 15–500)
Eosinophils Relative: 1.3 %
HCT: 43.6 % (ref 35.0–45.0)
Hemoglobin: 14.2 g/dL (ref 11.7–15.5)
Lymphs Abs: 1524 cells/uL (ref 850–3900)
MCH: 29.5 pg (ref 27.0–33.0)
MCHC: 32.6 g/dL (ref 32.0–36.0)
MCV: 90.5 fL (ref 80.0–100.0)
MPV: 9.1 fL (ref 7.5–12.5)
Monocytes Relative: 7.8 %
Neutro Abs: 3405 cells/uL (ref 1500–7800)
Neutrophils Relative %: 61.9 %
Platelets: 333 10*3/uL (ref 140–400)
RBC: 4.82 10*6/uL (ref 3.80–5.10)
RDW: 12.8 % (ref 11.0–15.0)
Total Lymphocyte: 27.7 %
WBC: 5.5 10*3/uL (ref 3.8–10.8)

## 2019-08-04 LAB — LIPID PANEL
Cholesterol: 271 mg/dL — ABNORMAL HIGH (ref <200)
HDL: 59 mg/dL (ref >=50)
LDL Cholesterol (Calc): 191 mg/dL (calc) — ABNORMAL HIGH
Non-HDL Cholesterol (Calc): 212 mg/dL (calc) — ABNORMAL HIGH (ref <130)
Total CHOL/HDL Ratio: 4.6 (calc) (ref <5.0)
Triglycerides: 96 mg/dL (ref <150)

## 2019-08-04 LAB — HEMOGLOBIN A1C
Hgb A1c MFr Bld: 5.3 % of total Hgb (ref <5.7)
Mean Plasma Glucose: 105 (calc)
eAG (mmol/L): 5.8 (calc)

## 2019-08-04 LAB — COMPLETE METABOLIC PANEL WITH GFR
AG Ratio: 1.6 (calc) (ref 1.0–2.5)
ALT: 9 U/L (ref 6–29)
AST: 12 U/L (ref 10–35)
Albumin: 4.5 g/dL (ref 3.6–5.1)
Alkaline phosphatase (APISO): 46 U/L (ref 31–125)
BUN/Creatinine Ratio: 10 (calc) (ref 6–22)
BUN: 11 mg/dL (ref 7–25)
CO2: 30 mmol/L (ref 20–32)
Calcium: 10.1 mg/dL (ref 8.6–10.2)
Chloride: 103 mmol/L (ref 98–110)
Creat: 1.11 mg/dL — ABNORMAL HIGH (ref 0.50–1.10)
GFR, Est African American: 69 mL/min/{1.73_m2} (ref >=60)
GFR, Est Non African American: 60 mL/min/{1.73_m2} (ref >=60)
Globulin: 2.9 g/dL (calc) (ref 1.9–3.7)
Glucose, Bld: 85 mg/dL (ref 65–99)
Potassium: 4.4 mmol/L (ref 3.5–5.3)
Sodium: 140 mmol/L (ref 135–146)
Total Bilirubin: 0.5 mg/dL (ref 0.2–1.2)
Total Protein: 7.4 g/dL (ref 6.1–8.1)

## 2019-08-04 LAB — VITAMIN D 25 HYDROXY (VIT D DEFICIENCY, FRACTURES): Vit D, 25-Hydroxy: 8 ng/mL — ABNORMAL LOW (ref 30–100)

## 2019-08-06 ENCOUNTER — Ambulatory Visit (INDEPENDENT_AMBULATORY_CARE_PROVIDER_SITE_OTHER): Payer: Self-pay | Admitting: Gastroenterology

## 2019-08-06 ENCOUNTER — Other Ambulatory Visit: Payer: Self-pay

## 2019-08-06 DIAGNOSIS — Z1211 Encounter for screening for malignant neoplasm of colon: Secondary | ICD-10-CM

## 2019-08-06 LAB — CYTOLOGY - PAP
Comment: NEGATIVE
Diagnosis: NEGATIVE
High risk HPV: POSITIVE — AB

## 2019-08-06 NOTE — Progress Notes (Signed)
Gastroenterology Pre-Procedure Review  Request Date: 09/04/19 Requesting Physician: Dr. Marius Ditch  PATIENT REVIEW QUESTIONS: The patient responded to the following health history questions as indicated:    1. Are you having any GI issues? no 2. Do you have a personal history of Polyps? no 3. Do you have a family history of Colon Cancer or Polyps? no 4. Diabetes Mellitus? no 5. Joint replacements in the past 12 months?no 6. Major health problems in the past 3 months?no 7. Any artificial heart valves, MVP, or defibrillator?no    MEDICATIONS & ALLERGIES:    Patient reports the following regarding taking any anticoagulation/antiplatelet therapy:   Plavix, Coumadin, Eliquis, Xarelto, Lovenox, Pradaxa, Brilinta, or Effient? no Aspirin? no  Patient confirms/reports the following medications:  Current Outpatient Medications  Medication Sig Dispense Refill  . Cholecalciferol (VITAMIN D) 2000 units CAPS Take 1 capsule (2,000 Units total) by mouth daily. 30 capsule 0  . EPINEPHrine 0.3 mg/0.3 mL IJ SOAJ injection      No current facility-administered medications for this visit.    Patient confirms/reports the following allergies:  Allergies  Allergen Reactions  . Tea Hives  . Bee Venom     Bee    No orders of the defined types were placed in this encounter.   AUTHORIZATION INFORMATION Primary Insurance: 1D#: Group #:  Secondary Insurance: 1D#: Group #:  SCHEDULE INFORMATION: Date: Friday 09/04/19 Time: Location:[facility]

## 2019-08-09 ENCOUNTER — Other Ambulatory Visit: Payer: Self-pay | Admitting: Family Medicine

## 2019-08-09 MED ORDER — VITAMIN D (ERGOCALCIFEROL) 1.25 MG (50000 UNIT) PO CAPS: 50000.0000 [IU] | ORAL_CAPSULE | ORAL | 3 refills | Status: DC

## 2019-08-21 ENCOUNTER — Ambulatory Visit (INDEPENDENT_AMBULATORY_CARE_PROVIDER_SITE_OTHER): Payer: Commercial Managed Care - PPO | Admitting: Family Medicine

## 2019-08-21 ENCOUNTER — Other Ambulatory Visit: Payer: Self-pay

## 2019-08-21 ENCOUNTER — Ambulatory Visit
Admission: RE | Admit: 2019-08-21 | Discharge: 2019-08-21 | Disposition: A | Discharge status: Home / Self Care | Payer: Commercial Managed Care - PPO | Source: Ambulatory Visit | Attending: Family Medicine | Admitting: Family Medicine

## 2019-08-21 ENCOUNTER — Encounter: Payer: Self-pay | Admitting: Family Medicine

## 2019-08-21 VITALS — BP 110/80 | HR 89 | Temp 97.5°F | Resp 16 | Ht 65.5 in | Wt 176.4 lb

## 2019-08-21 DIAGNOSIS — Z1231 Encounter for screening mammogram for malignant neoplasm of breast: Secondary | ICD-10-CM | POA: Diagnosis not present

## 2019-08-21 DIAGNOSIS — M654 Radial styloid tenosynovitis [de Quervain]: Secondary | ICD-10-CM

## 2019-08-21 MED ORDER — MELOXICAM 15 MG PO TABS: 15.0000 mg | ORAL_TABLET | Freq: Every day | ORAL | 0 refills | Status: DC

## 2019-08-21 NOTE — Patient Instructions (Signed)
De Quervain's Tenosynovitis  De Quervain's tenosynovitis is a condition that causes inflammation of the tendon on the thumb side of the wrist. Tendons are cords of tissue that connect bones to muscles. The tendons in the hand pass through a tunnel called a sheath. A slippery layer of tissue (synovium) lets the tendons move smoothly in the sheath. With de Quervain's tenosynovitis, the sheath swells or thickens, causing friction and pain. The condition is also called de Quervain's disease and de Quervain's syndrome. It occurs most often in women who are 46-46 years old. What are the causes? The exact cause of this condition is not known. It may be associated with overuse of the hand and wrist. What increases the risk? You are more likely to develop this condition if you:  Use your hands far more than normal, especially if you repeat certain movements that involve twisting your hand or using a tight grip.  Are pregnant.  Are a middle-aged woman.  Have rheumatoid arthritis.  Have diabetes. What are the signs or symptoms? The main symptom of this condition is pain on the thumb side of the wrist. The pain may get worse when you grasp something or turn your wrist. Other symptoms may include:  Pain that extends up the forearm.  Swelling of your wrist and hand.  Trouble moving the thumb and wrist.  A sensation of snapping in the wrist.  A bump filled with fluid (cyst) in the area of the pain. How is this diagnosed? This condition may be diagnosed based on:  Your symptoms and medical history.  A physical exam. During the exam, your health care provider may do a simple test Wynn Maudlin test) that involves pulling your thumb and wrist to see if this causes pain. You may also need to have an X-ray. How is this treated? Treatment for this condition may include:  Avoiding any activity that causes pain and swelling.  Taking medicines. Anti-inflammatory medicines and corticosteroid  injections may be used to reduce inflammation and relieve pain.  Wearing a splint.  Having surgery. This may be needed if other treatments do not work. Once the pain and swelling has gone down:  Physical therapy. This includes stretching and strengthening exercises.  Occupational therapy. This includes adjusting how you move your wrist. Follow these instructions at home: If you have a splint:  Wear the splint as told by your health care provider. Remove it only as told by your health care provider.  Loosen the splint if your fingers tingle, become numb, or turn cold and blue.  Keep the splint clean.  If the splint is not waterproof: ? Do not let it get wet. ? Cover it with a watertight covering when you take a bath or a shower. Managing pain, stiffness, and swelling   Avoid movements and activities that cause pain and swelling in the wrist area.  If directed, put ice on the painful area. This may be helpful after doing activities that involve the sore wrist. ? Put ice in a plastic bag. ? Place a towel between your skin and the bag. ? Leave the ice on for 20 minutes, 2-3 times a day.  Move your fingers often to avoid stiffness and to lessen swelling.  Raise (elevate) the injured area above the level of your heart while you are sitting or lying down. General instructions  Return to your normal activities as told by your health care provider. Ask your health care provider what activities are safe for you.  Take over-the-counter  and prescription medicines only as told by your health care provider.  Keep all follow-up visits as told by your health care provider. This is important. Contact a health care provider if:  Your pain medicine does not help.  Your pain gets worse.  You develop new symptoms. Summary  De Quervain's tenosynovitis is a condition that causes inflammation of the tendon on the thumb side of the wrist.  The condition occurs most often in women who are  21-46 years old.  The exact cause of this condition is not known. It may be associated with overuse of the hand and wrist.  Treatment starts with avoiding activity that causes pain or swelling in the wrist area. Other treatment may include wearing a splint and taking medicine. Sometimes, surgery is needed. This information is not intended to replace advice given to you by your health care provider. Make sure you discuss any questions you have with your health care provider. Document Revised: 10/10/2017 Document Reviewed: 03/18/2017 Elsevier Patient Education  2020 [employer].

## 2019-08-21 NOTE — Progress Notes (Signed)
Name: Tanya Robinson   MRN: [id number]    DOB: Oct 04, 1975   Date:08/21/2019       Progress Note  Subjective  Chief Complaint  Chief Complaint  Patient presents with  . Hand Pain    She has pain in her right thumb x 3 months. Pain is constant, aching, sharp and shooting. Denies injury to area. She types all day for a living x 20 years.  . Referral    HPI  Right thumb pain : she has the same type of work for 24  years, she types all day ( she works in the Technical sales engineer at [employer]) . Over the past few months she has noticed pain on right thumb that radiates to right forearm, no tingling or numbness, it is described as sharp and intense at times but most the time is an aching pain. Improves with ice and imobilization but severe with certain movements. Getting worse, slightly swollen at times. She has bene taking Tylenol or Aleve prn.  Right hand dominant   Patient Active Problem List   Diagnosis Date Noted  . 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 Bee sting allergy 07/28/2015  . Dyslipidemia 11/02/2014  . Migraine without aura and responsive to treatment 11/02/2014  . Overweight 11/02/2014  . Vitamin D deficiency 11/02/2014  . Urinary incontinence in female 11/02/2014    Social History   Tobacco Use  . Smoking status: Never Smoker  . Smokeless tobacco: Never Used  Substance Use Topics  . Alcohol use: Yes    Comment: occasionally     Current Outpatient Medications:  .  Cholecalciferol (VITAMIN D) 2000 units CAPS, Take 1 capsule (2,000 Units total) by mouth daily., Disp: 30 capsule, Rfl: 0 .  EPINEPHrine 0.3 mg/0.3 mL IJ SOAJ injection, , Disp: , Rfl:  .  Vitamin D, Ergocalciferol, (DRISDOL) 1.25 MG (50000 UNIT) CAPS capsule, Take 1 capsule (50,000 Units total) by mouth every 7 (seven) days., Disp: 12 capsule, Rfl: 3 .  meloxicam (MOBIC) 15 MG tablet, Take 1 tablet (15 mg total) by mouth daily., Disp: 30 tablet, Rfl: 0  Allergies  Allergen  Reactions  . Tea Hives  . Bee Venom     Bee    ROS  Ten systems reviewed and is negative except as mentioned in HPI   Objective  Vitals:   08/21/19 1509  BP: 110/80  Pulse: 89  Resp: 16  Temp: (!) 97.5 F (36.4 C)  TempSrc: Temporal  SpO2: 94%  Weight: 176 lb 6.4 oz (80 kg)  Height: 5' 5.5" (1.664 m)    Body mass index is 28.91 kg/m.    Physical Exam  Constitutional: Patient appears well-developed and well-nourished. Obese  No distress.  HEENT: head atraumatic, normocephalic, pupils equal and reactive to light  Cardiovascular: Normal rate, regular rhythm and normal heart sounds.  No murmur heard. No BLE edema. Pulmonary/Chest: Effort normal and breath sounds normal. No respiratory distress. Abdominal: Soft.  There is no tenderness. Muscular Skeletal: pain during flexion of thumb and ulnar extension of wrist, pain during palpation of radial aspect of right thumb Psychiatric: Patient has a normal mood and affect. behavior is normal. Judgment and thought content normal.  Recent Results (from the past 2160 hour(s))  Cytology - PAP     Status: Abnormal   Collection Time: 08/03/19  3:09 PM  Result Value Ref Range   High risk HPV Positive (A)    Adequacy Satisfactory for evaluation.  Diagnosis      - Negative for intraepithelial lesion or malignancy (NILM)   Microorganisms      Fungal organisms present consistent with Candida spp.   Comment Normal Reference Range HPV - Negative   Lipid panel     Status: Abnormal   Collection Time: 08/03/19  3:21 PM  Result Value Ref Range   Cholesterol 271 (H) <200 mg/dL   HDL 59 > OR = 50 mg/dL   Triglycerides 96 <150 mg/dL   LDL Cholesterol (Calc) 191 (H) mg/dL (calc)    Comment: LDL-C levels > or = 190 mg/dL may indicate familial  hypercholesterolemia (FH). Clinical assessment and  measurement of blood lipid levels should be  considered for all first degree relatives of  patients with an FH diagnosis.  For questions about  testing for familial hypercholesterolemia, please call Insurance risk surveyor at [email or web address]. Duncan Dull, et al. J National Lipid Association  Recommendations for Patient-Centered Management of  Dyslipidemia: Part 1 Journal of Clinical Lipidology  2015;9(2), 129-169. Reference range: <100 . Desirable range <100 mg/dL for primary prevention;   <70 mg/dL for patients with CHD or diabetic patients  with > or = 2 CHD risk factors. Marland Kitchen LDL-C is now calculated using the Martin-Hopkins  calculation, which is a validated novel method providing  better accuracy than the Friedewald equation in the  estimation of LDL-C.  Cresenciano Genre et al. Annamaria Helling. 9563;875(64): 2061-2068  (http://education.[facility].com/faq/FAQ164)    Total CHOL/HDL Ratio 4.6 <5.0 (calc)   Non-HDL Cholesterol (Calc) 212 (H) <130 mg/dL (calc)    Comment: For patients with diabetes plus 1 major ASCVD risk  factor, treating to a non-HDL-C goal of <100 mg/dL  (LDL-C of <70 mg/dL) is considered a therapeutic  option.   COMPLETE METABOLIC PANEL WITH GFR     Status: Abnormal   Collection Time: 08/03/19  3:21 PM  Result Value Ref Range   Glucose, Bld 85 65 - 99 mg/dL    Comment: .            Fasting reference interval .    BUN 11 7 - 25 mg/dL   Creat 1.11 (H) 0.50 - 1.10 mg/dL   GFR, Est Non African American 60 > OR = 60 mL/min/1.68m   GFR, Est African American 69 > OR = 60 mL/min/1.781m  BUN/Creatinine Ratio 10 6 - 22 (calc)   Sodium 140 135 - 146 mmol/L   Potassium 4.4 3.5 - 5.3 mmol/L   Chloride 103 98 - 110 mmol/L   CO2 30 20 - 32 mmol/L   Calcium 10.1 8.6 - 10.2 mg/dL   Total Protein 7.4 6.1 - 8.1 g/dL   Albumin 4.5 3.6 - 5.1 g/dL   Globulin 2.9 1.9 - 3.7 g/dL (calc)   AG Ratio 1.6 1.0 - 2.5 (calc)   Total Bilirubin 0.5 0.2 - 1.2 mg/dL   Alkaline phosphatase (APISO) 46 31 - 125 U/L   AST 12 10 - 35 U/L   ALT 9 6 - 29 U/L  CBC with Differential/Platelet     Status: None   Collection Time:  08/03/19  3:21 PM  Result Value Ref Range   WBC 5.5 3.8 - 10.8 Thousand/uL   RBC 4.82 3.80 - 5.10 Million/uL   Hemoglobin 14.2 11.7 - 15.5 g/dL   HCT 43.6 35.0 - 45.0 %   MCV 90.5 80.0 - 100.0 fL   MCH 29.5 27.0 - 33.0 pg   MCHC 32.6 32.0 - 36.0 g/dL   RDW  12.8 11.0 - 15.0 %   Platelets 333 140 - 400 Thousand/uL   MPV 9.1 7.5 - 12.5 fL   Neutro Abs 3,405 1,500 - 7,800 cells/uL   Lymphs Abs 1,524 850 - 3,900 cells/uL   Absolute Monocytes 429 200 - 950 cells/uL   Eosinophils Absolute 72 15 - 500 cells/uL   Basophils Absolute 72 0 - 200 cells/uL   Neutrophils Relative % 61.9 %   Total Lymphocyte 27.7 %   Monocytes Relative 7.8 %   Eosinophils Relative 1.3 %   Basophils Relative 1.3 %  Hemoglobin A1c     Status: None   Collection Time: 08/03/19  3:21 PM  Result Value Ref Range   Hgb A1c MFr Bld 5.3 <5.7 % of total Hgb    Comment: For the purpose of screening for the presence of diabetes: . <5.7%       Consistent with the absence of diabetes 5.7-6.4%    Consistent with increased risk for diabetes             (prediabetes) > or =6.5%  Consistent with diabetes . This assay result is consistent with a decreased risk of diabetes. . Currently, no consensus exists regarding use of hemoglobin A1c for diagnosis of diabetes in children. . According to American Diabetes Association (ADA) guidelines, hemoglobin A1c <7.0% represents optimal control in non-pregnant diabetic patients. Different metrics may apply to specific patient populations.  Standards of Medical Care in Diabetes(ADA). .    Mean Plasma Glucose 105 (calc)   eAG (mmol/L) 5.8 (calc)  VITAMIN D 25 Hydroxy (Vit-D Deficiency, Fractures)     Status: Abnormal   Collection Time: 08/03/19  3:21 PM  Result Value Ref Range   Vit D, 25-Hydroxy 8 (L) 30 - 100 ng/mL    Comment: Vitamin D Status         25-OH Vitamin D: . Deficiency:                    <20 ng/mL Insufficiency:             20 - 29 ng/mL Optimal:                  > or = 30 ng/mL . For 25-OH Vitamin D testing on patients on  D2-supplementation and patients for whom quantitation  of D2 and D3 fractions is required, the [facility](TM) 25-OH VIT D, (D2,D3), LC/MS/MS is recommended: order  code [id number] (patients >89yr). See Note 1 . Note 1 . For additional information, please refer to  http://education.[facility].com/faq/FAQ199  (This link is being provided for informational/ educational purposes only.)      Assessment & Plan  1. Radial styloid tenosynovitis (de quervain)  - Ambulatory referral to Orthopedic Surgery - meloxicam (MOBIC) 15 MG tablet; Take 1 tablet (15 mg total) by mouth daily.  Dispense: 30 tablet; Refill: 0

## 2019-08-24 ENCOUNTER — Inpatient Hospital Stay
Admission: RE | Admit: 2019-08-24 | Discharge: 2019-08-24 | Disposition: A | Discharge status: Home / Self Care | Payer: Self-pay | Source: Ambulatory Visit | Attending: *Deleted | Admitting: *Deleted

## 2019-08-24 ENCOUNTER — Other Ambulatory Visit: Payer: Self-pay | Admitting: Family Medicine

## 2019-08-24 ENCOUNTER — Other Ambulatory Visit: Payer: Self-pay | Admitting: *Deleted

## 2019-08-24 DIAGNOSIS — Z1231 Encounter for screening mammogram for malignant neoplasm of breast: Secondary | ICD-10-CM

## 2019-08-24 DIAGNOSIS — R928 Other abnormal and inconclusive findings on diagnostic imaging of breast: Secondary | ICD-10-CM

## 2019-09-07 ENCOUNTER — Ambulatory Visit
Admission: RE | Admit: 2019-09-07 | Discharge: 2019-09-07 | Disposition: A | Discharge status: Home / Self Care | Payer: Commercial Managed Care - PPO | Source: Ambulatory Visit | Attending: Family Medicine | Admitting: Family Medicine

## 2019-09-07 DIAGNOSIS — R928 Other abnormal and inconclusive findings on diagnostic imaging of breast: Secondary | ICD-10-CM

## 2019-09-13 ENCOUNTER — Other Ambulatory Visit: Payer: Self-pay | Admitting: Family Medicine

## 2019-09-13 DIAGNOSIS — M654 Radial styloid tenosynovitis [de Quervain]: Secondary | ICD-10-CM

## 2019-09-13 NOTE — Telephone Encounter (Signed)
Requested medications are due for refill today?  Yes  Requested medications are on active medication list?  Yes  Last Refill:   08/21/2019  # 30 with no refill. Patient referred to ortho for further evaluation of radial tenosynovitis (de quervain).    Future visit scheduled?  No   Notes to Clinic:  Patient has an appt with Dr. Rudene Christians - Orthopedics on Monday, 09/14/2019.  Did not know if Dr. Rudene Christians would perform an in-office injection or not or prescribe steroids.  Please review.

## 2019-09-14 ENCOUNTER — Telehealth: Payer: Self-pay

## 2019-09-14 NOTE — Telephone Encounter (Signed)
Patient's colonoscopy has been canceled per her request received from the front office.  Thanks,  [place], [place]

## 2019-09-18 ENCOUNTER — Ambulatory Visit: Admit: 2019-09-18 | Payer: Commercial Managed Care - PPO | Admitting: Gastroenterology

## 2019-09-18 SURGERY — COLONOSCOPY WITH PROPOFOL
Anesthesia: General

## 2020-05-23 ENCOUNTER — Telehealth (INDEPENDENT_AMBULATORY_CARE_PROVIDER_SITE_OTHER): Payer: Commercial Managed Care - PPO | Admitting: Family Medicine

## 2020-05-23 ENCOUNTER — Other Ambulatory Visit: Payer: Self-pay

## 2020-05-23 ENCOUNTER — Telehealth: Payer: Self-pay | Admitting: Family Medicine

## 2020-05-23 ENCOUNTER — Encounter: Payer: Self-pay | Admitting: Family Medicine

## 2020-05-23 DIAGNOSIS — Z008 Encounter for other general examination: Secondary | ICD-10-CM

## 2020-05-23 NOTE — Progress Notes (Signed)
Cancelled day of visit

## 2020-05-23 NOTE — Telephone Encounter (Signed)
Pt has an appt for this afternoon

## 2020-05-23 NOTE — Telephone Encounter (Signed)
Relation to pt: self  Call back number: [phone]  Pharmacy:  [id number] [place], [place] [address]:  [phone] Fax:  [phone]      Reason for call:  [facility]. Patient experiencing sore throat, cough, body aches, no fever. Patient will send COVID results through my chart. Offered patient appointment no availability until 2/29/2022

## 2020-05-25 ENCOUNTER — Other Ambulatory Visit: Payer: Self-pay

## 2020-05-25 ENCOUNTER — Encounter: Payer: Self-pay | Admitting: Family Medicine

## 2020-05-25 DIAGNOSIS — Z20822 Contact with and (suspected) exposure to covid-19: Secondary | ICD-10-CM

## 2020-05-25 NOTE — Progress Notes (Signed)
COVID Orders

## 2020-08-05 ENCOUNTER — Encounter: Payer: Commercial Managed Care - PPO | Admitting: Family Medicine

## 2020-09-13 NOTE — Progress Notes (Signed)
Name: Tanya Robinson   MRN: [id number]    DOB: 1975/07/14   Date:09/14/2020       Progress Note  Subjective  Chief Complaint  Annual Exam   HPI  Patient presents for annual CPE.  Hyperlipidemia: mother takes cholesterol medication, father has diabetes, her LDL last year was 55, discussed familial dyslipidemia. She does not want to start medications  Vitamin D : last level was very low at 8, she took a rx for 12 weeks last year and is now taking otc vitamin D 1000 daily   HPV positive: we will recheck pap smear today  Abnormal mammogram: she did not go for repeat in 6 months but would like to have it done now   Diet: she states she is not eating as many sweets, and down to one bottle of soda daily ( 20 oz) Exercise: continue regular physical activity   [place] Office Visit from 08/03/2019 in [facility]  AUDIT-C Score 0     Depression: Phq 9 is  negative Depression screen [facility] 2/9 09/14/2020 09/14/2020 05/23/2020 08/21/2019 08/03/2019  Decreased Interest 0 0 0 0 0  Down, Depressed, Hopeless 0 0 0 0 1  PHQ - 2 Score 0 0 0 0 1  Altered sleeping 0 - - 0 2  Tired, decreased energy 0 - - 0 2  Change in appetite 0 - - 0 2  Feeling bad or failure about yourself  0 - - 0 0  Trouble concentrating 0 - - 0 0  Moving slowly or fidgety/restless 0 - - 0 0  Suicidal thoughts 0 - - 0 0  PHQ-9 Score 0 - - 0 7  Difficult doing work/chores - - - - Not difficult at all   Hypertension: BP Readings from Last 3 Encounters:  09/14/20 116/78  08/21/19 110/80  08/03/19 110/80   Obesity: Wt Readings from Last 3 Encounters:  09/14/20 173 lb (78.5 kg)  08/21/19 176 lb 6.4 oz (80 kg)  08/03/19 175 lb 9.6 oz (79.7 kg)   BMI Readings from Last 3 Encounters:  09/14/20 27.92 kg/m  08/21/19 28.91 kg/m  08/03/19 28.78 kg/m     Vaccines:   Pneumonia: N/A educated and discussed with patient. Flu: 02/22/2019 educated and discussed with patient.  Hep C Screening:  12/25/2017 STD testing and prevention (HIV/chl/gon/syphilis): 12/25/2017 Intimate partner violence: negative screen  Sexual History : still has some pain depending on the sexual position, but resolves with adjusting position  Menstrual History/LMP/Abnormal Bleeding: s/p supra cervical hysterectomy  Incontinence Symptoms: no problems   Breast cancer:  - Last Mammogram: 09/07/2019 - BRCA gene screening: N/A  Osteoporosis: Discussed high calcium and vitamin D supplementation, weight bearing exercises  Cervical cancer screening: 09/14/2020  Skin cancer: Discussed monitoring for atypical lesions  Colorectal cancer:  Discussed options with patient today    ECG: 07/04/2018  Advanced Care Planning: A voluntary discussion about advance care planning including the explanation and discussion of advance directives.  Discussed health care proxy and Living will, and the patient was able to identify a health care proxy as husband   Lipids: Lab Results  Component Value Date   CHOL 271 (H) 08/03/2019   CHOL 245 (H) 12/25/2017   CHOL 296 (H) 07/30/2016   Lab Results  Component Value Date   HDL 59 08/03/2019   HDL 65 12/25/2017   HDL 76 07/30/2016   Lab Results  Component Value Date   LDLCALC 191 (H) 08/03/2019   LDLCALC 161 (  H) 12/25/2017   LDLCALC 198 (H) 07/30/2016   Lab Results  Component Value Date   TRIG 96 08/03/2019   TRIG 88 12/25/2017   TRIG 109 07/30/2016   Lab Results  Component Value Date   CHOLHDL 4.6 08/03/2019   CHOLHDL 3.8 12/25/2017   CHOLHDL 3.9 07/30/2016   No results found for: LDLDIRECT  Glucose: Glucose  Date Value Ref Range Status  07/30/2016 115 (H) 65 - 99 mg/dL Final  02/07/2016 94 65 - 99 mg/dL Final  05/08/2011 103 (H) 65 - 99 mg/dL Final   Glucose, Bld  Date Value Ref Range Status  08/03/2019 85 65 - 99 mg/dL Final    Comment:    .            Fasting reference interval .   12/25/2017 78 65 - 139 mg/dL Final    Comment:    .         Non-fasting reference interval .   05/11/2015 94 65 - 99 mg/dL Final    Patient Active Problem List   Diagnosis Date Noted  . 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 Bee sting allergy 07/28/2015  . Dyslipidemia 11/02/2014  . Migraine without aura and responsive to treatment 11/02/2014  . Overweight 11/02/2014  . Vitamin D deficiency 11/02/2014  . Urinary incontinence in female 11/02/2014    Past Surgical History:  Procedure Laterality Date  . ABDOMINAL HYSTERECTOMY    . TUBAL LIGATION  2009    Family History  Problem Relation Age of Onset  . Hypertension Mother   . Hyperlipidemia Mother   . Diabetes Father     Social History   Socioeconomic History  . Marital status: Married    Spouse name: [place]   . Number of children: 2  . Years of education: Not on file  . Highest education level: High school graduate  Occupational History  . Occupation: Warehouse manager: [employer]  Tobacco Use  . Smoking status: Never Smoker  . Smokeless tobacco: Never Used  Vaping Use  . Vaping Use: Never used  Substance and Sexual Activity  . Alcohol use: Yes    Comment: occasionally  . Drug use: No  . Sexual activity: Yes    Partners: Male    Birth control/protection: Surgical  Other Topics Concern  . 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 She started at [employer] April 2019, she has been working from home since March 2020 because of pandemic    She has been living with boyfriend ( together since 2011)   He was previously married, and he has twins from previously relationship.    Her two children, are grown now. A son and a daughter.    Daughter had a miscarriage in 2021   Social Determinants of Health   Financial Resource Strain: Low Risk   . Difficulty of Paying Living Expenses: Not hard at all  Food Insecurity: No Food Insecurity  . Worried About Sales executive in the Last Year: Never true  . Ran Out of Food in the Last Year: Never true  Transportation Needs: No Transportation Needs  . Lack of Transportation (Medical): No  . Lack of Transportation (Non-Medical): No  Physical Activity: Sufficiently Active  . Days of Exercise per Week: 3 days  . Minutes of Exercise per Session: 60 min  Stress: No Stress Concern  Present  . Feeling of Stress : Only a little  Social Connections: Socially Integrated  . Frequency of Communication with Friends and Family: More than three times a week  . Frequency of Social Gatherings with Friends and Family: Three times a week  . Attends Religious Services: More than 4 times per year  . Active Member of Clubs or Organizations: Yes  . Attends Archivist Meetings: Never  . Marital Status: Married  Human resources officer Violence: Not At Risk  . Fear of Current or Ex-Partner: No  . Emotionally Abused: No  . Physically Abused: No  . Sexually Abused: No     Current Outpatient Medications:  .  Cholecalciferol (VITAMIN D) 2000 units CAPS, Take 1 capsule (2,000 Units total) by mouth daily., Disp: 30 capsule, Rfl: 0 .  EPINEPHrine 0.3 mg/0.3 mL IJ SOAJ injection, , Disp: , Rfl:  .  meloxicam (MOBIC) 15 MG tablet, Take 1 tablet (15 mg total) by mouth daily. (Patient not taking: Reported on 09/14/2020), Disp: 30 tablet, Rfl: 0 .  Vitamin D, Ergocalciferol, (DRISDOL) 1.25 MG (50000 UNIT) CAPS capsule, Take 1 capsule (50,000 Units total) by mouth every 7 (seven) days. (Patient not taking: Reported on 09/14/2020), Disp: 12 capsule, Rfl: 3  Allergies  Allergen Reactions  . Tea Hives  . Bee Venom     Bee     ROS  Constitutional: Negative for fever or weight change.  Respiratory: Negative for cough and shortness of breath.   Cardiovascular: Negative for chest pain or palpitations.  Gastrointestinal: Negative for abdominal pain, no bowel changes.  Musculoskeletal: Negative for gait problem or joint swelling.   Skin: Negative for rash.  Neurological: Negative for dizziness or headache.  No other specific complaints in a complete review of systems (except as listed in HPI above).  Objective  Vitals:   09/14/20 1336  BP: 116/78  Pulse: 67  Resp: 16  Temp: 98.1 F (36.7 C)  TempSrc: Oral  Weight: 173 lb (78.5 kg)  Height: 5' 6"  (1.676 m)    Body mass index is 27.92 kg/m.  Physical Exam  Constitutional: Patient appears well-developed and well-nourished. No distress.  HENT: Head: Normocephalic and atraumatic. Ears: B TMs ok, no erythema or effusion; Nose: Not done  Mouth/Throat: not done Eyes: Conjunctivae and EOM are normal. Pupils are equal, round, and reactive to light. No scleral icterus.  Neck: Normal range of motion. Neck supple. No JVD present. No thyromegaly present.  Cardiovascular: Normal rate, regular rhythm and normal heart sounds.  No murmur heard. No BLE edema. Pulmonary/Chest: Effort normal and breath sounds normal. No respiratory distress. Abdominal: Soft. Bowel sounds are normal, no distension. There is no tenderness. no masses Breast: no lumps or masses, no nipple discharge or rashes FEMALE GENITALIA:  External genitalia normal External urethra normal Vaginal vault normal without discharge or lesions Cervix normal without discharge or lesions Bimanual exam normal without masses RECTAL: no rectal masses or hemorrhoids Musculoskeletal: Normal range of motion, no joint effusions. No gross deformities Neurological: he is alert and oriented to person, place, and time. No cranial nerve deficit. Coordination, balance, strength, speech and gait are normal.  Skin: Skin is warm and dry. No rash noted. No erythema.  Psychiatric: Patient has a normal mood and affect. behavior is normal. Judgment and thought content normal.  Fall Risk: Fall Risk  09/14/2020 05/23/2020 08/21/2019 12/05/2018 07/04/2018  Falls in the past year? 0 0 0 0 0  Number falls in past yr: 0 0 0 0  0  Injury  with Fall? 0 0 0 0 0  Follow up Falls prevention discussed - - - -    Functional Status Survey: Is the patient deaf or have difficulty hearing?: No Does the patient have difficulty seeing, even when wearing glasses/contacts?: No Does the patient have difficulty concentrating, remembering, or making decisions?: No Does the patient have difficulty walking or climbing stairs?: No Does the patient have difficulty dressing or bathing?: No Does the patient have difficulty doing errands alone such as visiting a doctor's office or shopping?: No   Assessment & Plan  1. Cervical cancer screening  Today   2. Colon cancer screening  - Ambulatory referral to Gastroenterology  3. Dyslipidemia  - Lipid panel  4. Abnormal mammogram  - MM Digital Diagnostic Bilat; Future - US BREAST LTD UNI RIGHT INC AXILLA; Future  5. Vitamin D deficiency  - VITAMIN D 25 Hydroxy (Vit-D Deficiency, Fractures)  6. Well adult exam  - Lipid panel - CBC with Differential/Platelet - Hemoglobin A1c - VITAMIN D 25 Hydroxy (Vit-D Deficiency, Fractures) - Comprehensive metabolic panel  7. Diabetes mellitus screening  - Hemoglobin A1c  8. Cervical high risk HPV (human papillomavirus) test positive  - IGP,CtNgTv,Apt HPV  -USPSTF grade A and B recommendations reviewed with patient; age-appropriate recommendations, preventive care, screening tests, etc discussed and encouraged; healthy living encouraged; see AVS for patient education given to patient -Discussed importance of 150 minutes of physical activity weekly, eat two servings of fish weekly, eat one serving of tree nuts ( cashews, pistachios, pecans, almonds.Marland Kitchen) every other day, eat 6 servings of fruit/vegetables daily and drink plenty of water and avoid sweet beverages.

## 2020-09-13 NOTE — Patient Instructions (Signed)
Preventive Care 84-47 Years Old, Female Preventive care refers to lifestyle choices and visits with your health care provider that can promote health and wellness. This includes:  A yearly physical exam. This is also called an annual wellness visit.  Regular dental and eye exams.  Immunizations.  Screening for certain conditions.  Healthy lifestyle choices, such as: ? Eating a healthy diet. ? Getting regular exercise. ? Not using drugs or products that contain nicotine and tobacco. ? Limiting alcohol use. What can I expect for my preventive care visit? Physical exam Your health care provider will check your:  Height and weight. These may be used to calculate your BMI (body mass index). BMI is a measurement that tells if you are at a healthy weight.  Heart rate and blood pressure.  Body temperature.  Skin for abnormal spots. Counseling Your health care provider may ask you questions about your:  Past medical problems.  Family's medical history.  Alcohol, tobacco, and drug use.  Emotional well-being.  Home life and relationship well-being.  Sexual activity.  Diet, exercise, and sleep habits.  Work and work Statistician.  Access to firearms.  Method of birth control.  Menstrual cycle.  Pregnancy history. What immunizations do I need? Vaccines are usually given at various ages, according to a schedule. Your health care provider will recommend vaccines for you based on your age, medical history, and lifestyle or other factors, such as travel or where you work.   What tests do I need? Blood tests  Lipid and cholesterol levels. These may be checked every 5 years, or more often if you are over 3 years old.  Hepatitis C test.  Hepatitis B test. Screening  Lung cancer screening. You may have this screening every year starting at age 73 if you have a 30-pack-year history of smoking and currently smoke or have quit within the past 15 years.  Colorectal cancer  screening. ? All adults should have this screening starting at age 52 and continuing until age 17. ? Your health care provider may recommend screening at age 49 if you are at increased risk. ? You will have tests every 1-10 years, depending on your results and the type of screening test.  Diabetes screening. ? This is done by checking your blood sugar (glucose) after you have not eaten for a while (fasting). ? You may have this done every 1-3 years.  Mammogram. ? This may be done every 1-2 years. ? Talk with your health care provider about when you should start having regular mammograms. This may depend on whether you have a family history of breast cancer.  BRCA-related cancer screening. This may be done if you have a family history of breast, ovarian, tubal, or peritoneal cancers.  Pelvic exam and Pap test. ? This may be done every 3 years starting at age 10. ? Starting at age 11, this may be done every 5 years if you have a Pap test in combination with an HPV test. Other tests  STD (sexually transmitted disease) testing, if you are at risk.  Bone density scan. This is done to screen for osteoporosis. You may have this scan if you are at high risk for osteoporosis. Talk with your health care provider about your test results, treatment options, and if necessary, the need for more tests. Follow these instructions at home: Eating and drinking  Eat a diet that includes fresh fruits and vegetables, whole grains, lean protein, and low-fat dairy products.  Take vitamin and mineral supplements  as recommended by your health care provider.  Do not drink alcohol if: ? Your health care provider tells you not to drink. ? You are pregnant, may be pregnant, or are planning to become pregnant.  If you drink alcohol: ? Limit how much you have to 0-1 drink a day. ? Be aware of how much alcohol is in your drink. In the [place] one drink equals one 12 oz bottle of beer (355 mL), one 5 oz glass of  wine (148 mL), or one 1 oz glass of hard liquor (44 mL).   Lifestyle  Take daily care of your teeth and gums. Brush your teeth every morning and night with fluoride toothpaste. Floss one time each day.  Stay active. Exercise for at least 30 minutes 5 or more days each week.  Do not use any products that contain nicotine or tobacco, such as cigarettes, e-cigarettes, and chewing tobacco. If you need help quitting, ask your health care provider.  Do not use drugs.  If you are sexually active, practice safe sex. Use a condom or other form of protection to prevent STIs (sexually transmitted infections).  If you do not wish to become pregnant, use a form of birth control. If you plan to become pregnant, see your health care provider for a prepregnancy visit.  If told by your health care provider, take low-dose aspirin daily starting at age 50.  Find healthy ways to cope with stress, such as: ? Meditation, yoga, or listening to music. ? Journaling. ? Talking to a trusted person. ? Spending time with friends and family. Safety  Always wear your seat belt while driving or riding in a vehicle.  Do not drive: ? If you have been drinking alcohol. Do not ride with someone who has been drinking. ? When you are tired or distracted. ? While texting.  Wear a helmet and other protective equipment during sports activities.  If you have firearms in your house, make sure you follow all gun safety procedures. What's next?  Visit your health care provider once a year for an annual wellness visit.  Ask your health care provider how often you should have your eyes and teeth checked.  Stay up to date on all vaccines. This information is not intended to replace advice given to you by your health care provider. Make sure you discuss any questions you have with your health care provider. Document Revised: 01/12/2020 Document Reviewed: 12/19/2017 Elsevier Patient Education  2021 Elsevier Inc.  

## 2020-09-14 ENCOUNTER — Other Ambulatory Visit: Payer: Self-pay

## 2020-09-14 ENCOUNTER — Encounter: Payer: Self-pay | Admitting: Family Medicine

## 2020-09-14 ENCOUNTER — Ambulatory Visit (INDEPENDENT_AMBULATORY_CARE_PROVIDER_SITE_OTHER): Payer: Managed Care, Other (non HMO) | Admitting: Family Medicine

## 2020-09-14 VITALS — BP 116/78 | HR 67 | Temp 98.1°F | Resp 16 | Ht 66.0 in | Wt 173.0 lb

## 2020-09-14 DIAGNOSIS — Z1211 Encounter for screening for malignant neoplasm of colon: Secondary | ICD-10-CM | POA: Diagnosis not present

## 2020-09-14 DIAGNOSIS — Z124 Encounter for screening for malignant neoplasm of cervix: Secondary | ICD-10-CM | POA: Diagnosis not present

## 2020-09-14 DIAGNOSIS — E785 Hyperlipidemia, unspecified: Secondary | ICD-10-CM

## 2020-09-14 DIAGNOSIS — Z0000 Encounter for general adult medical examination without abnormal findings: Secondary | ICD-10-CM

## 2020-09-14 DIAGNOSIS — R87810 Cervical high risk human papillomavirus (HPV) DNA test positive: Secondary | ICD-10-CM

## 2020-09-14 DIAGNOSIS — E559 Vitamin D deficiency, unspecified: Secondary | ICD-10-CM

## 2020-09-14 DIAGNOSIS — R928 Other abnormal and inconclusive findings on diagnostic imaging of breast: Secondary | ICD-10-CM | POA: Diagnosis not present

## 2020-09-14 DIAGNOSIS — Z131 Encounter for screening for diabetes mellitus: Secondary | ICD-10-CM

## 2020-09-16 ENCOUNTER — Other Ambulatory Visit: Payer: Commercial Managed Care - PPO

## 2020-09-16 ENCOUNTER — Inpatient Hospital Stay: Admission: RE | Admit: 2020-09-16 | Payer: Commercial Managed Care - PPO | Source: Ambulatory Visit

## 2020-09-19 LAB — IGP,CTNGTV,APT HPV
Chlamydia, Nuc. Acid Amp: NEGATIVE
Gonococcus, Nuc. Acid Amp: NEGATIVE
HPV Aptima: NEGATIVE
Trich vag by NAA: NEGATIVE

## 2020-09-21 ENCOUNTER — Other Ambulatory Visit: Payer: Self-pay

## 2020-09-21 ENCOUNTER — Ambulatory Visit
Admission: RE | Admit: 2020-09-21 | Discharge: 2020-09-21 | Disposition: A | Discharge status: Home / Self Care | Payer: Managed Care, Other (non HMO) | Source: Ambulatory Visit | Attending: Family Medicine | Admitting: Family Medicine

## 2020-09-21 DIAGNOSIS — R928 Other abnormal and inconclusive findings on diagnostic imaging of breast: Secondary | ICD-10-CM | POA: Insufficient documentation

## 2020-09-22 ENCOUNTER — Other Ambulatory Visit: Payer: Self-pay

## 2020-09-22 ENCOUNTER — Telehealth: Payer: Self-pay

## 2020-09-22 MED ORDER — SUPREP BOWEL PREP KIT 17.5-3.13-1.6 GM/177ML PO SOLN
1.0000 | ORAL | 0 refills | Status: DC
Start: 2020-09-22 — End: 2021-02-20

## 2020-09-22 NOTE — Telephone Encounter (Signed)
Gastroenterology Pre-Procedure Review  Request Date: 10/14/20 Requesting Physician: Dr. Vicente Males  PATIENT REVIEW QUESTIONS: The patient responded to the following health history questions as indicated:    1. Are you having any GI issues? no 2. Do you have a personal history of Polyps? no 3. Do you have a family history of Colon Cancer or Polyps? no 4. Diabetes Mellitus? no 5. Joint replacements in the past 12 months?no 6. Major health problems in the past 3 months?no 7. Any artificial heart valves, MVP, or defibrillator?no    MEDICATIONS & ALLERGIES:    Patient reports the following regarding taking any anticoagulation/antiplatelet therapy:   Plavix, Coumadin, Eliquis, Xarelto, Lovenox, Pradaxa, Brilinta, or Effient? no Aspirin? no  Patient confirms/reports the following medications:  Current Outpatient Medications  Medication Sig Dispense Refill  . Cholecalciferol (VITAMIN D) 2000 units CAPS Take 1 capsule (2,000 Units total) by mouth daily. 30 capsule 0  . EPINEPHrine 0.3 mg/0.3 mL IJ SOAJ injection  (Patient not taking: Reported on 09/14/2020)    . meloxicam (MOBIC) 15 MG tablet Take 1 tablet (15 mg total) by mouth daily. (Patient not taking: Reported on 09/14/2020) 30 tablet 0  . Na Sulfate-K Sulfate-Mg Sulf (SUPREP BOWEL PREP KIT) 17.5-3.13-1.6 GM/177ML SOLN Take 1 kit by mouth as directed. 354 mL 0  . Vitamin D, Ergocalciferol, (DRISDOL) 1.25 MG (50000 UNIT) CAPS capsule Take 1 capsule (50,000 Units total) by mouth every 7 (seven) days. (Patient not taking: Reported on 09/14/2020) 12 capsule 3   No current facility-administered medications for this visit.    Patient confirms/reports the following allergies:  Allergies  Allergen Reactions  . Tea Hives  . Bee Venom     Bee    No orders of the defined types were placed in this encounter.   AUTHORIZATION INFORMATION Primary Insurance: 1D#: Group #:  Secondary Insurance: 1D#: Group #:  SCHEDULE INFORMATION: Date:   Time: Location:

## 2020-10-14 ENCOUNTER — Encounter: Admission: RE | Payer: Self-pay | Source: Home / Self Care

## 2020-10-14 ENCOUNTER — Ambulatory Visit
Admission: RE | Admit: 2020-10-14 | Payer: Managed Care, Other (non HMO) | Source: Home / Self Care | Admitting: Gastroenterology

## 2020-10-14 SURGERY — COLONOSCOPY
Anesthesia: General

## 2021-01-25 ENCOUNTER — Encounter: Payer: Self-pay | Admitting: Family Medicine

## 2021-02-20 ENCOUNTER — Encounter: Payer: Self-pay | Admitting: Family Medicine

## 2021-02-20 ENCOUNTER — Other Ambulatory Visit: Payer: Self-pay

## 2021-02-20 ENCOUNTER — Ambulatory Visit: Payer: Managed Care, Other (non HMO) | Admitting: Family Medicine

## 2021-02-20 VITALS — BP 116/76 | HR 79 | Temp 98.5°F | Resp 16 | Ht 66.0 in | Wt 187.2 lb

## 2021-02-20 DIAGNOSIS — E559 Vitamin D deficiency, unspecified: Secondary | ICD-10-CM

## 2021-02-20 DIAGNOSIS — Z23 Encounter for immunization: Secondary | ICD-10-CM

## 2021-02-20 DIAGNOSIS — E785 Hyperlipidemia, unspecified: Secondary | ICD-10-CM

## 2021-02-20 DIAGNOSIS — E669 Obesity, unspecified: Secondary | ICD-10-CM

## 2021-02-20 DIAGNOSIS — R5383 Other fatigue: Secondary | ICD-10-CM

## 2021-02-20 DIAGNOSIS — R928 Other abnormal and inconclusive findings on diagnostic imaging of breast: Secondary | ICD-10-CM | POA: Diagnosis not present

## 2021-02-20 DIAGNOSIS — R635 Abnormal weight gain: Secondary | ICD-10-CM

## 2021-02-20 NOTE — Progress Notes (Signed)
Name: Tanya Robinson   MRN: [id number]    DOB: 1975-10-12   Date:02/20/2021       Progress Note  Subjective  Chief Complaint  Chief Complaint  Patient presents with   Weight Loss    consult    HPI  Weight gain: her weight was stable int he mid 170's for years, however over the past 5 months she has noticed weight gain and fatigue. She is now 189 lbs and that is heavier than when she was pregnant with her son. She is also feeling tired , she has noticed some hair loss, but no change in bowel movement Denies major changes in job or life style. She eats two meals a day, usually skips breakfast , snacks on junk until 1 pm when she has a lunch break, she is avoiding sodas. She eats dinner at home. She is cutting down on chocolate also   Hyperlipidemia: mother takes cholesterol medication, father has diabetes, her LDL last year was 61, discussed familial dyslipidemia. We will recheck labs, she is not on medications at this time  The 10-year ASCVD risk score (Arnett DK, et al., 2019) is: 1.1%   Values used to calculate the score:     Age: 45 years     Sex: Female     Is Non-Hispanic African American: Yes     Diabetic: No     Tobacco smoker: No     Systolic Blood Pressure: 287 mmHg     Is BP treated: No     HDL Cholesterol: 59 mg/dL     Total Cholesterol: 271 mg/dL   Vitamin D : she is taking otc vitamin D and we will recheck labs today   Right breast mass: up to date with [place] and mammogram will repeat in June 2023   Patient Active Problem List   Diagnosis Date Noted   Cervical high risk HPV (human papillomavirus) test positive 07/30/2016   Bee sting allergy 07/28/2015   Dyslipidemia 11/02/2014   Migraine without aura and responsive to treatment 11/02/2014   Overweight 11/02/2014   Vitamin D deficiency 11/02/2014   Urinary incontinence in female 11/02/2014    Past Surgical History:  Procedure Laterality Date   ABDOMINAL HYSTERECTOMY     TUBAL LIGATION  2009    Family History   Problem Relation Age of Onset   Hypertension Mother    Hyperlipidemia Mother    Diabetes Father     Social History   Tobacco Use   Smoking status: Never   Smokeless tobacco: Never  Substance Use Topics   Alcohol use: Yes    Comment: occasionally     Current Outpatient Medications:    EPINEPHrine 0.3 mg/0.3 mL IJ SOAJ injection, , Disp: , Rfl:    Vitamin D, Ergocalciferol, (DRISDOL) 1.25 MG (50000 UNIT) CAPS capsule, Take 1 capsule (50,000 Units total) by mouth every 7 (seven) days., Disp: 12 capsule, Rfl: 3   Cholecalciferol (VITAMIN D) 2000 units CAPS, Take 1 capsule (2,000 Units total) by mouth daily. (Patient not taking: Reported on 02/20/2021), Disp: 30 capsule, Rfl: 0   meloxicam (MOBIC) 15 MG tablet, Take 1 tablet (15 mg total) by mouth daily. (Patient not taking: No sig reported), Disp: 30 tablet, Rfl: 0   Na Sulfate-K Sulfate-Mg Sulf (SUPREP BOWEL PREP KIT) 17.5-3.13-1.6 GM/177ML SOLN, Take 1 kit by mouth as directed. (Patient not taking: Reported on 02/20/2021), Disp: 354 mL, Rfl: 0  Allergies  Allergen Reactions   Tea Hives   Bee Venom  Bee    I personally reviewed active problem list, medication list, allergies, family history, social history, health maintenance with the patient/caregiver today.   ROS  Constitutional: Negative for fever or weight change.  Respiratory: Negative for cough and shortness of breath.   Cardiovascular: Negative for chest pain or palpitations.  Gastrointestinal: Negative for abdominal pain, no bowel changes.  Musculoskeletal: Negative for gait problem or joint swelling.  Skin: Negative for rash.  Neurological: Negative for dizziness or headache.  No other specific complaints in a complete review of systems (except as listed in HPI above).   Objective  Vitals:   02/20/21 0820  BP: 116/76  Pulse: 79  Resp: 16  Temp: 98.5 F (36.9 C)  TempSrc: Oral  SpO2: 97%  Weight: 187 lb 3.2 oz (84.9 kg)  Height: 5' 6"  (1.676 m)     Body mass index is 30.21 kg/m.  Physical Exam  Constitutional: Patient appears well-developed and well-nourished. Obese  No distress.  HEENT: head atraumatic, normocephalic, pupils equal and reactive to light, neck supple, throat within normal limits Cardiovascular: Normal rate, regular rhythm and normal heart sounds.  No murmur heard. No BLE edema. Pulmonary/Chest: Effort normal and breath sounds normal. No respiratory distress. Abdominal: Soft.  There is no tenderness. Psychiatric: Patient has a normal mood and affect. behavior is normal. Judgment and thought content normal.   PHQ2/9: Depression screen [facility] 2/9 02/20/2021 09/14/2020 09/14/2020 05/23/2020 08/21/2019  Decreased Interest 0 0 0 0 0  Down, Depressed, Hopeless 0 0 0 0 0  PHQ - 2 Score 0 0 0 0 0  Altered sleeping - 0 - - 0  Tired, decreased energy - 0 - - 0  Change in appetite - 0 - - 0  Feeling bad or failure about yourself  - 0 - - 0  Trouble concentrating - 0 - - 0  Moving slowly or fidgety/restless - 0 - - 0  Suicidal thoughts - 0 - - 0  PHQ-9 Score - 0 - - 0  Difficult doing work/chores - - - - -    phq 9 is negative   Fall Risk: Fall Risk  02/20/2021 09/14/2020 05/23/2020 08/21/2019 12/05/2018  Falls in the past year? 1 0 0 0 0  Number falls in past yr: 0 0 0 0 0  Injury with Fall? 1 0 0 0 0  Risk for fall due to : History of fall(s) - - - -  Follow up Falls prevention discussed Falls prevention discussed - - -      Functional Status Survey: Is the patient deaf or have difficulty hearing?: No Does the patient have difficulty seeing, even when wearing glasses/contacts?: No Does the patient have difficulty concentrating, remembering, or making decisions?: No Does the patient have difficulty walking or climbing stairs?: No Does the patient have difficulty dressing or bathing?: No Does the patient have difficulty doing errands alone such as visiting a doctor's office or shopping?: No    Assessment &  Plan  1. Other fatigue  - TSH  2. Need for immunization against influenza  - Flu Vaccine QUAD 65moIM (Fluarix, Fluzone & Alfiuria Quad PF)  3. Weight gain  - TSH  4. Abnormal mammogram  Up to date   5. Dyslipidemia   6. Vitamin D deficiency   Continue supplementation   7. Obesity (BMI 30.0-34.9)   Discussed with the patient the risk posed by an increased BMI. Discussed importance of portion control, calorie counting and at least 150 minutes of physical  activity weekly. Avoid sweet beverages and drink more water. Eat at least 6 servings of fruit and vegetables daily    Discussed checking weight at home once a week Discussed healthier diet, packing meals for work, avoid easy snacks

## 2021-03-02 ENCOUNTER — Telehealth: Payer: Self-pay

## 2021-03-02 NOTE — Telephone Encounter (Signed)
Appointment Request From: Carlyon Shadow   With Provider: Sherri Sear, MD [facility]]   Preferred Date Range: Any date 02/20/2021 or later   Preferred Times: Any   Reason: To address the following health maintenance concerns. Colonoscopy (Pts 45-41yr Insurance Coverage Will Need To Be Confirmed)   Comments: I need to reschedule my colonoscopy. I unable to drink the medicine that I was provided. I spoke with my dr and she stayed that I could be prescribed a pill to take instead

## 2021-03-03 NOTE — Telephone Encounter (Signed)
Spoke with patient about scheduling colonoscopy. Pt plans to call back later to schedule.

## 2021-03-07 LAB — COMPREHENSIVE METABOLIC PANEL
ALT: 9 IU/L (ref 0–32)
AST: 13 IU/L (ref 0–40)
Albumin/Globulin Ratio: 1.8 (ref 1.2–2.2)
Albumin: 4.5 g/dL (ref 3.8–4.8)
Alkaline Phosphatase: 64 IU/L (ref 44–121)
BUN/Creatinine Ratio: 12 (ref 9–23)
BUN: 12 mg/dL (ref 6–24)
Bilirubin Total: <0.2 mg/dL (ref 0.0–1.2)
CO2: 24 mmol/L (ref 20–29)
Calcium: 9.4 mg/dL (ref 8.7–10.2)
Chloride: 102 mmol/L (ref 96–106)
Creatinine, Ser: 1.01 mg/dL — ABNORMAL HIGH (ref 0.57–1.00)
Globulin, Total: 2.5 g/dL (ref 1.5–4.5)
Glucose: 93 mg/dL (ref 70–99)
Potassium: 4.2 mmol/L (ref 3.5–5.2)
Sodium: 139 mmol/L (ref 134–144)
Total Protein: 7 g/dL (ref 6.0–8.5)
eGFR: 69 mL/min/{1.73_m2} (ref >59)

## 2021-03-07 LAB — CBC WITH DIFFERENTIAL/PLATELET
Basophils Absolute: 0.1 10*3/uL (ref 0.0–0.2)
Basos: 1 %
EOS (ABSOLUTE): 0.1 10*3/uL (ref 0.0–0.4)
Eos: 2 %
Hematocrit: 42 % (ref 34.0–46.6)
Hemoglobin: 13.9 g/dL (ref 11.1–15.9)
Immature Grans (Abs): 0 10*3/uL (ref 0.0–0.1)
Immature Granulocytes: 0 %
Lymphocytes Absolute: 1.7 10*3/uL (ref 0.7–3.1)
Lymphs: 29 %
MCH: 28.5 pg (ref 26.6–33.0)
MCHC: 33.1 g/dL (ref 31.5–35.7)
MCV: 86 fL (ref 79–97)
Monocytes Absolute: 0.4 10*3/uL (ref 0.1–0.9)
Monocytes: 7 %
Neutrophils Absolute: 3.6 10*3/uL (ref 1.4–7.0)
Neutrophils: 61 %
Platelets: 397 10*3/uL (ref 150–450)
RBC: 4.88 x10E6/uL (ref 3.77–5.28)
RDW: 12.5 % (ref 11.7–15.4)
WBC: 5.9 10*3/uL (ref 3.4–10.8)

## 2021-03-07 LAB — VITAMIN D 25 HYDROXY (VIT D DEFICIENCY, FRACTURES): Vit D, 25-Hydroxy: 13.1 ng/mL — ABNORMAL LOW (ref 30.0–100.0)

## 2021-03-07 LAB — LIPID PANEL
Chol/HDL Ratio: 4.1 ratio (ref 0.0–4.4)
Cholesterol, Total: 263 mg/dL — ABNORMAL HIGH (ref 100–199)
HDL: 64 mg/dL (ref >39)
LDL Chol Calc (NIH): 184 mg/dL — ABNORMAL HIGH (ref 0–99)
Triglycerides: 89 mg/dL (ref 0–149)
VLDL Cholesterol Cal: 15 mg/dL (ref 5–40)

## 2021-03-07 LAB — HEMOGLOBIN A1C
Est. average glucose Bld gHb Est-mCnc: 117 mg/dL
Hgb A1c MFr Bld: 5.7 % — ABNORMAL HIGH (ref 4.8–5.6)

## 2021-03-07 LAB — TSH: TSH: 1.59 u[IU]/mL (ref 0.450–4.500)

## 2021-05-01 ENCOUNTER — Other Ambulatory Visit: Payer: Self-pay

## 2021-05-01 ENCOUNTER — Emergency Department (HOSPITAL_COMMUNITY): Payer: Managed Care, Other (non HMO)

## 2021-05-01 ENCOUNTER — Emergency Department (HOSPITAL_COMMUNITY)
Admission: EM | Admit: 2021-05-01 | Discharge: 2021-05-01 | Disposition: A | Discharge status: Home / Self Care | Payer: Managed Care, Other (non HMO) | Attending: Emergency Medicine | Admitting: Emergency Medicine

## 2021-05-01 ENCOUNTER — Encounter (HOSPITAL_COMMUNITY): Payer: Self-pay | Admitting: *Deleted

## 2021-05-01 DIAGNOSIS — K2100 Gastro-esophageal reflux disease with esophagitis, without bleeding: Secondary | ICD-10-CM | POA: Diagnosis not present

## 2021-05-01 DIAGNOSIS — Z20822 Contact with and (suspected) exposure to covid-19: Secondary | ICD-10-CM | POA: Insufficient documentation

## 2021-05-01 DIAGNOSIS — R072 Precordial pain: Secondary | ICD-10-CM | POA: Diagnosis present

## 2021-05-01 LAB — BASIC METABOLIC PANEL
Anion gap: 7 (ref 5–15)
BUN: 8 mg/dL (ref 6–20)
CO2: 24 mmol/L (ref 22–32)
Calcium: 9 mg/dL (ref 8.9–10.3)
Chloride: 104 mmol/L (ref 98–111)
Creatinine, Ser: 0.99 mg/dL (ref 0.44–1.00)
GFR, Estimated: >60 mL/min (ref >60)
Glucose, Bld: 117 mg/dL — ABNORMAL HIGH (ref 70–99)
Potassium: 3.6 mmol/L (ref 3.5–5.1)
Sodium: 135 mmol/L (ref 135–145)

## 2021-05-01 LAB — CBC
HCT: 41.8 % (ref 36.0–46.0)
Hemoglobin: 14.2 g/dL (ref 12.0–15.0)
MCH: 29.2 pg (ref 26.0–34.0)
MCHC: 34 g/dL (ref 30.0–36.0)
MCV: 86 fL (ref 80.0–100.0)
Platelets: 292 10*3/uL (ref 150–400)
RBC: 4.86 MIL/uL (ref 3.87–5.11)
RDW: 13.1 % (ref 11.5–15.5)
WBC: 5.7 10*3/uL (ref 4.0–10.5)
nRBC: 0 % (ref 0.0–0.2)

## 2021-05-01 LAB — TROPONIN I (HIGH SENSITIVITY)
Troponin I (High Sensitivity): <2 ng/L (ref <18)
Troponin I (High Sensitivity): <2 ng/L (ref <18)

## 2021-05-01 LAB — RESP PANEL BY RT-PCR (FLU A&B, COVID) ARPGX2
Influenza A by PCR: NEGATIVE
Influenza B by PCR: NEGATIVE
SARS Coronavirus 2 by RT PCR: NEGATIVE

## 2021-05-01 MED ORDER — ALUM & MAG HYDROXIDE-SIMETH 200-200-20 MG/5ML PO SUSP
30.0000 mL | Freq: Once | ORAL | Status: AC
  Administered 2021-05-01: 30 mL via ORAL
  Filled 2021-05-01: qty 30

## 2021-05-01 MED ORDER — PANTOPRAZOLE SODIUM 20 MG PO TBEC: 20.0000 mg | DELAYED_RELEASE_TABLET | Freq: Every day | ORAL | 0 refills | Status: DC

## 2021-05-01 MED ORDER — LIDOCAINE VISCOUS HCL 2 % MT SOLN
15.0000 mL | Freq: Once | OROMUCOSAL | Status: AC
  Administered 2021-05-01: 15 mL via ORAL
  Filled 2021-05-01: qty 15

## 2021-05-01 NOTE — Discharge Instructions (Addendum)
Your workup was reassuring at this time. Please pick up medication and take as prescribed. Follow up with your PCP for further evaluation.   Attached is additional information on acid reflux  Return to the ED for any new/worsening symptoms

## 2021-05-01 NOTE — ED Triage Notes (Signed)
Chest pain onset last night

## 2021-05-01 NOTE — ED Provider Notes (Signed)
[facility] EMERGENCY DEPARTMENT Provider Note   CSN: [id number] Arrival date & time: 05/01/21  1720     History  Chief Complaint  Patient presents with   Chest Pain    Tanya Robinson is a 46 y.o. female who presents to the ED today with complaint of gradual onset, constant, burning, substernal chest pain that began around 8 PM last night.  Patient also complains of nausea.  She reports the pain is exacerbated with laying flat.  She states that when she drinks water the pain will dissipate however afterwards she has pain again.  Whenever she tries to eat or drink anything else she has worsening burning sensation.  She denies any shortness of breath, diaphoresis, vomiting, leg swelling, abdominal pain.  Denies history of GERD.  Denies recent prolonged travel or immobilization.  No history of DVT or PE.  No exogenous hormone use.  No hemoptysis.  No active malignancy.  Patient is a never smoker.  Denies any family history of CAD.   The history is provided by the patient and medical records.   HPI: A 46 year old patient presents for evaluation of chest pain. Initial onset of pain was more than 6 hours ago. The patient's chest pain is not worse with exertion. The patient complains of nausea. The patient's chest pain is middle- or left-sided, is not well-localized, is not described as heaviness/pressure/tightness, is not sharp and does not radiate to the arms/jaw/neck. The patient denies diaphoresis. The patient has no history of stroke, has no history of peripheral artery disease, has not smoked in the past 90 days, denies any history of treated diabetes, has no relevant family history of coronary artery disease (first degree relative at less than age 10), is not hypertensive, has no history of hypercholesterolemia and does not have an elevated BMI (>=30).   Home Medications Prior to Admission medications   Medication Sig Start Date End Date Taking? Authorizing Provider  pantoprazole (PROTONIX) 20  MG tablet Take 1 tablet (20 mg total) by mouth daily. 05/01/21 05/31/21 Yes Sylvestre Rathgeber, PA-C  Cholecalciferol (VITAMIN D) 2000 units CAPS Take 1 capsule (2,000 Units total) by mouth daily. Patient not taking: Reported on 02/20/2021 12/25/17   Steele Sizer, MD  EPINEPHrine 0.3 mg/0.3 mL IJ SOAJ injection  08/03/19   [provider]      Allergies    Tea and Bee venom    Review of Systems   Review of Systems  Constitutional:  Negative for chills, diaphoresis and fever.  Respiratory:  Negative for shortness of breath.   Cardiovascular:  Positive for chest pain.  Gastrointestinal:  Positive for nausea. Negative for abdominal pain and vomiting.  Musculoskeletal:  Negative for myalgias.  All other systems reviewed and are negative.  Physical Exam Updated Vital Signs BP 125/73    Pulse 80    Temp 99.1 F (37.3 C) (Oral)    Resp 18    SpO2 100%  Physical Exam Vitals and nursing note reviewed.  Constitutional:      Appearance: She is not ill-appearing or diaphoretic.  HENT:     Head: Normocephalic and atraumatic.  Eyes:     Conjunctiva/sclera: Conjunctivae normal.  Cardiovascular:     Rate and Rhythm: Normal rate and regular rhythm.     Pulses:          Radial pulses are 2+ on the right side and 2+ on the left side.     Heart sounds: Normal heart sounds.  Pulmonary:  Effort: Pulmonary effort is normal.     Breath sounds: Normal breath sounds.  Chest:     Chest wall: Tenderness present.     Comments: + mild substernal chest wall TTP Abdominal:     Palpations: Abdomen is soft.     Tenderness: There is no abdominal tenderness. There is no guarding or rebound.  Musculoskeletal:     Cervical back: Neck supple.  Skin:    General: Skin is warm and dry.  Neurological:     Mental Status: She is alert.    ED Results / Procedures / Treatments   Labs (all labs ordered are listed, but only abnormal results are displayed) Labs Reviewed  BASIC METABOLIC PANEL - Abnormal;  Notable for the following components:      Result Value   Glucose, Bld 117 (*)    All other components within normal limits  RESP PANEL BY RT-PCR (FLU A&B, COVID) ARPGX2  CBC  TROPONIN I (HIGH SENSITIVITY)  TROPONIN I (HIGH SENSITIVITY)    EKG None  Radiology DG Chest 2 View  Result Date: 05/01/2021 CLINICAL DATA:  chest pain EXAM: CHEST - 2 VIEW COMPARISON:  05/08/2011. FINDINGS: No consolidation. No visible pleural effusions or pneumothorax. Cardiomediastinal silhouette is within normal limits and unchanged. Slight reverse S-shaped thoracolumbar curvature. IMPRESSION: No evidence of acute cardiopulmonary disease. Electronically Signed   By: Margaretha Sheffield M.D.   On: 05/01/2021 17:53    Procedures Procedures    Medications Ordered in ED Medications  alum & mag hydroxide-simeth (MAALOX/MYLANTA) 200-200-20 MG/5ML suspension 30 mL (30 mLs Oral Given 05/01/21 1810)    And  lidocaine (XYLOCAINE) 2 % viscous mouth solution 15 mL (15 mLs Oral Given 05/01/21 1810)    ED Course/ Medical Decision Making/ A&P Clinical Course as of 05/01/21 2026  Mon May 01, 2021  8119 Troponin I (High Sensitivity): <2 [MV]    Clinical Course User Index [MV] Eustaquio Maize, [place]   HEAR Score: 2                       Medical Decision Making 46 year old female who presents to the ED today with complaint of burning substernal chest pain that began last night with associated nausea.  Worse with lying flat.  On arrival to the ED today patient's temperature is 99.1 and she is mildly tachycardic at 106.  Remainder vitals unremarkable.  EKG does show sinus tachycardia however no acute ischemic changes.  Chest x-ray obtained prior to being seen without any acute cardiopulmonary disease.  On my exam she is resting comfortably.  She appears to be in no acute distress.  She is noted to have some substernal chest wall tenderness palpation on exam today.  She describes a burning sensation that is alleviated with  drinking water however worse with drinking anything other than water.  No history of acid reflux however does seem consistent with same.  She denies any shortness of breath, diaphoresis, vomiting given overall well appearance I have lower suspicion for ACS rule work-up for same with troponin.  Provide GI cocktail and reevaluate.  Problems Addressed: Gastroesophageal reflux disease with esophagitis without hemorrhage: acute illness or injury    Details: Chest pain workup negative at this time. Symptoms improved after GI cocktail. Will discharge home at this time with protonix and PCP follow up. Pt is in agreement with plan and stable for discharge home.  Amount and/or Complexity of Data Reviewed Labs: ordered.    Details: CBC without leukocytosis.  Hgb stable at 14.2 BMP with glucose 117. No other electrolyte abnormalities Troponin < 2 COVID and flu negative  Repeat troponin < 2 Radiology: ordered.    Details: CXR negative           Final Clinical Impression(s) / ED Diagnoses Final diagnoses:  Gastroesophageal reflux disease with esophagitis without hemorrhage    Rx / DC Orders ED Discharge Orders          Ordered    pantoprazole (PROTONIX) 20 MG tablet  Daily        05/01/21 2026             Discharge Instructions      Your workup was reassuring at this time. Please pick up medication and take as prescribed. Follow up with your PCP for further evaluation.   Attached is additional information on acid reflux  Return to the ED for any new/worsening symptoms        Eustaquio Maize, Hershal Coria 05/01/21 2026    Godfrey Pick, MD 05/03/21 [phone]

## 2021-05-12 NOTE — Progress Notes (Signed)
Name: Tanya Robinson   MRN: [id number]    DOB: July 14, 1975   Date:05/15/2021       Progress Note  Subjective  Chief Complaint  Follow Up  HPI  Weight gain: her weight was stable int he mid 170's for years, however over the past 5 months she has noticed weight gain and fatigue. She is now hovering around high 180's  and that is heavier than when she was pregnant with her son. She is also feeling tired , she has noticed some hair loss, but no change in bowel movement She states since Nov she is working from home, she started to eat breakfast and snacking on fresh vegetables, eating last meal of the day at 7 pm. She is having juice and pink lemonade daily. Advised to only drink water, no sweet beverages - she likes orange juice, sprite and pink lemonade. She is walking during her lunch break for 30 minutes   Hyperlipidemia: mother takes cholesterol medication, father has diabetes, her LDL last year was 60, discussed familial dyslipidemia. She does not want to start medications at this time, eating healthier   The 10-year ASCVD risk score (Arnett DK, et al., 2019) is: 1.1%   Values used to calculate the score:     Age: 46 years     Sex: Female     Is Non-Hispanic African American: Yes     Diabetic: No     Tobacco smoker: No     Systolic Blood Pressure: 382 mmHg     Is BP treated: No     HDL Cholesterol: 64 mg/dL     Total Cholesterol: 263 mg/dL    Vitamin D : she is taking otc vitamin D and we will recheck labs today   Right breast mass: up to date with [place] and mammogram will repeat in June 2023. She is aware   GERD: she went to [facility] 01/09 with severe burning sensation on esophagus and dysphagia. She was given GI cocktail and sent home on PPI. She states symptoms started a few weeks prior to her visit to [facility] , she was taking Tums prn. 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   Dyslipidemia 11/02/2014   Migraine without aura and responsive to treatment 11/02/2014   Vitamin D deficiency 11/02/2014   Urinary incontinence in female 11/02/2014    Past Surgical History:  Procedure Laterality Date   ABDOMINAL HYSTERECTOMY     TUBAL LIGATION  2009    Family History  Problem Relation Age of Onset   Hypertension Mother    Hyperlipidemia Mother    Diabetes Father     Social History   Tobacco Use   Smoking status: Never   Smokeless tobacco: Never  Substance Use Topics   Alcohol use: Not Currently    Comment: occasionally     Current Outpatient Medications:    Cholecalciferol (VITAMIN D) 2000 units CAPS, Take 1 capsule (2,000 Units total) by mouth daily., Disp: 30 capsule, Rfl: 0   EPINEPHrine 0.3 mg/0.3 mL IJ SOAJ injection, , Disp: , Rfl:    pantoprazole (PROTONIX) 20 MG tablet, Take 1 tablet (20 mg total) by mouth daily., Disp: 30 tablet, Rfl: 0  Allergies  Allergen Reactions   Tea Hives   Bee Venom  Bee    I personally reviewed active problem list, medication list, allergies, family history, social history, health maintenance with the patient/caregiver today.   ROS  Constitutional: Negative for fever or weight change.  Respiratory: Negative for cough and shortness of breath.   Cardiovascular: Negative for chest pain or palpitations.  Gastrointestinal: Negative for abdominal pain, no bowel changes.  Musculoskeletal: Negative for gait problem or joint swelling.  Skin: Negative for rash.  Neurological: Negative for dizziness or headache.  No other specific complaints in a complete review of systems (except as listed in HPI above).   Objective  Vitals:   05/15/21 1528  BP: 122/78  Pulse: 94  Resp: 16  Temp: 98.4 F (36.9 C)  SpO2: 99%  Weight: 186 lb (84.4 kg)  Height: 5' 6"  (1.676 m)    Body mass index is 30.02  kg/m.  Physical Exam  Constitutional: Patient appears well-developed and well-nourished. Obese  No distress.  HEENT: head atraumatic, normocephalic, pupils equal and reactive to light, neck supple Cardiovascular: Normal rate, regular rhythm and normal heart sounds.  No murmur heard. No BLE edema. Pulmonary/Chest: Effort normal and breath sounds normal. No respiratory distress. Abdominal: Soft.  There is no tenderness. Psychiatric: Patient has a normal mood and affect. behavior is normal. Judgment and thought content normal.   Recent Results (from the past 2160 hour(s))  TSH     Status: None   Collection Time: 03/06/21  8:37 AM  Result Value Ref Range   TSH 1.590 0.450 - 4.500 uIU/mL  CBC with Differential/Platelet     Status: None   Collection Time: 03/06/21  8:37 AM  Result Value Ref Range   WBC 5.9 3.4 - 10.8 x10E3/uL   RBC 4.88 3.77 - 5.28 x10E6/uL   Hemoglobin 13.9 11.1 - 15.9 g/dL   Hematocrit 42.0 34.0 - 46.6 %   MCV 86 79 - 97 fL   MCH 28.5 26.6 - 33.0 pg   MCHC 33.1 31.5 - 35.7 g/dL   RDW 12.5 11.7 - 15.4 %   Platelets 397 150 - 450 x10E3/uL   Neutrophils 61 Not Estab. %   Lymphs 29 Not Estab. %   Monocytes 7 Not Estab. %   Eos 2 Not Estab. %   Basos 1 Not Estab. %   Neutrophils Absolute 3.6 1.4 - 7.0 x10E3/uL   Lymphocytes Absolute 1.7 0.7 - 3.1 x10E3/uL   Monocytes Absolute 0.4 0.1 - 0.9 x10E3/uL   EOS (ABSOLUTE) 0.1 0.0 - 0.4 x10E3/uL   Basophils Absolute 0.1 0.0 - 0.2 x10E3/uL   Immature Granulocytes 0 Not Estab. %   Immature Grans (Abs) 0.0 0.0 - 0.1 x10E3/uL  Comprehensive metabolic panel     Status: Abnormal   Collection Time: 03/06/21  8:37 AM  Result Value Ref Range   Glucose 93 70 - 99 mg/dL   BUN 12 6 - 24 mg/dL   Creatinine, Ser 1.01 (H) 0.57 - 1.00 mg/dL   eGFR 69 >59 mL/min/1.73   BUN/Creatinine Ratio 12 9 - 23   Sodium 139 134 - 144 mmol/L   Potassium 4.2 3.5 - 5.2 mmol/L   Chloride 102 96 - 106 mmol/L   CO2 24 20 - 29 mmol/L   Calcium 9.4  8.7 - 10.2 mg/dL   Total Protein 7.0 6.0 - 8.5 g/dL   Albumin 4.5 3.8 - 4.8 g/dL   Globulin, Total 2.5 1.5 - 4.5 g/dL   Albumin/Globulin Ratio 1.8 1.2 - 2.2   Bilirubin Total <0.2 0.0 -  1.2 mg/dL   Alkaline Phosphatase 64 44 - 121 IU/L   AST 13 0 - 40 IU/L   ALT 9 0 - 32 IU/L  Lipid panel     Status: Abnormal   Collection Time: 03/06/21  8:37 AM  Result Value Ref Range   Cholesterol, Total 263 (H) 100 - 199 mg/dL   Triglycerides 89 0 - 149 mg/dL   HDL 64 >39 mg/dL   VLDL Cholesterol Cal 15 5 - 40 mg/dL   LDL Chol Calc (NIH) 184 (H) 0 - 99 mg/dL   Chol/HDL Ratio 4.1 0.0 - 4.4 ratio    Comment:                                   T. Chol/HDL Ratio                                             Men  Women                               1/2 Avg.Risk  3.4    3.3                                   Avg.Risk  5.0    4.4                                2X Avg.Risk  9.6    7.1                                3X Avg.Risk 23.4   11.0   Hemoglobin A1c     Status: Abnormal   Collection Time: 03/06/21  8:37 AM  Result Value Ref Range   Hgb A1c MFr Bld 5.7 (H) 4.8 - 5.6 %    Comment:          Prediabetes: 5.7 - 6.4          Diabetes: >6.4          Glycemic control for adults with diabetes: <7.0    Est. average glucose Bld gHb Est-mCnc 117 mg/dL  VITAMIN D 25 Hydroxy (Vit-D Deficiency, Fractures)     Status: Abnormal   Collection Time: 03/06/21  8:37 AM  Result Value Ref Range   Vit D, 25-Hydroxy 13.1 (L) 30.0 - 100.0 ng/mL    Comment: Vitamin D deficiency has been defined by the [place] and an Endocrine Society practice guideline as a level of serum 25-OH vitamin D less than 20 ng/mL (1,2). The Endocrine Society went on to further define vitamin D insufficiency as a level between 21 and 29 ng/mL (2). 1. IOM (Institute of Medicine). 2010. Dietary reference    intakes for calcium and D. [place]: The    [employer]. 2. Holick MF, Binkley Bradley, Bischoff-Ferrari HA, et  al.    Evaluation, treatment, and prevention of vitamin D    deficiency: an Endocrine Society clinical practice    guideline. JCEM. 2011 Jul; 96(7):1911-30.   Basic metabolic panel  Status: Abnormal   Collection Time: 05/01/21  5:28 PM  Result Value Ref Range   Sodium 135 135 - 145 mmol/L   Potassium 3.6 3.5 - 5.1 mmol/L   Chloride 104 98 - 111 mmol/L   CO2 24 22 - 32 mmol/L   Glucose, Bld 117 (H) 70 - 99 mg/dL    Comment: Glucose reference range applies only to samples taken after fasting for at least 8 hours.   BUN 8 6 - 20 mg/dL   Creatinine, Ser 0.99 0.44 - 1.00 mg/dL   Calcium 9.0 8.9 - 10.3 mg/dL   GFR, Estimated >60 >60 mL/min    Comment: (NOTE) Calculated using the CKD-EPI Creatinine Equation (2021)    Anion gap 7 5 - 15    Comment: Performed at [facility], [address], [place], [place] [address]  CBC     Status: None   Collection Time: 05/01/21  5:28 PM  Result Value Ref Range   WBC 5.7 4.0 - 10.5 K/uL   RBC 4.86 3.87 - 5.11 MIL/uL   Hemoglobin 14.2 12.0 - 15.0 g/dL   HCT 41.8 36.0 - 46.0 %   MCV 86.0 80.0 - 100.0 fL   MCH 29.2 26.0 - 34.0 pg   MCHC 34.0 30.0 - 36.0 g/dL   RDW 13.1 11.5 - 15.5 %   Platelets 292 150 - 400 K/uL   nRBC 0.0 0.0 - 0.2 %    Comment: Performed at [facility], [address] [place], [place] [address]  Troponin I (High Sensitivity)     Status: None   Collection Time: 05/01/21  5:28 PM  Result Value Ref Range   Troponin I (High Sensitivity) <2 <18 ng/L    Comment: (NOTE) Elevated high sensitivity troponin I (hsTnI) values and significant  changes across serial measurements may suggest ACS but many other  chronic and acute conditions are known to elevate hsTnI results.  Refer to the "Links" section for chest pain algorithms and additional  guidance. Performed at [facility], [address], [place],  [address]   Resp Panel by RT-PCR (Flu A&B, Covid) Nasopharyngeal Swab     Status: None   Collection Time: 05/01/21  5:30  PM   Specimen: Nasopharyngeal Swab; Nasopharyngeal(NP) swabs in vial transport medium  Result Value Ref Range   SARS Coronavirus 2 by RT PCR NEGATIVE NEGATIVE    Comment: (NOTE) SARS-CoV-2 target nucleic acids are NOT DETECTED.  The SARS-CoV-2 RNA is generally detectable in upper respiratory specimens during the acute phase of infection. The lowest concentration of SARS-CoV-2 viral copies this assay can detect is 138 copies/mL. A negative result does not preclude SARS-Cov-2 infection and should not be used as the sole basis for treatment or other patient management decisions. A negative result may occur with  improper specimen collection/handling, submission of specimen other than nasopharyngeal swab, presence of viral mutation(s) within the areas targeted by this assay, and inadequate number of viral copies(<138 copies/mL). A negative result must be combined with clinical observations, patient history, and epidemiological information. The expected result is Negative.  Fact Sheet for Patients:  EntrepreneurPulse.com.au  Fact Sheet for Healthcare Providers:  IncredibleEmployment.be  This test is no t yet approved or cleared by the [place] FDA and  has been authorized for detection and/or diagnosis of SARS-CoV-2 by FDA under an Emergency Use Authorization (EUA). This EUA will remain  in effect (meaning this test can be used) for the duration of the COVID-19 declaration under Section 564(b)(1) of  the Act, 21 U.S.C.section 360bbb-3(b)(1), unless the authorization is terminated  or revoked sooner.       Influenza A by PCR NEGATIVE NEGATIVE   Influenza B by PCR NEGATIVE NEGATIVE    Comment: (NOTE) The Xpert Xpress SARS-CoV-2/FLU/RSV plus assay is intended as an aid in the diagnosis of influenza from Nasopharyngeal swab specimens and should not be used as a sole basis for treatment. Nasal washings and aspirates are unacceptable for Xpert  Xpress SARS-CoV-2/FLU/RSV testing.  Fact Sheet for Patients: EntrepreneurPulse.com.au  Fact Sheet for Healthcare Providers: IncredibleEmployment.be  This test is not yet approved or cleared by the [place] FDA and has been authorized for detection and/or diagnosis of SARS-CoV-2 by FDA under an Emergency Use Authorization (EUA). This EUA will remain in effect (meaning this test can be used) for the duration of the COVID-19 declaration under Section 564(b)(1) of the Act, 21 U.S.C. section 360bbb-3(b)(1), unless the authorization is terminated or revoked.  Performed at [facility], [address] [place], [place] [address]   Troponin I (High Sensitivity)     Status: None   Collection Time: 05/01/21  7:29 PM  Result Value Ref Range   Troponin I (High Sensitivity) <2 <18 ng/L    Comment: (NOTE) Elevated high sensitivity troponin I (hsTnI) values and significant  changes across serial measurements may suggest ACS but many other  chronic and acute conditions are known to elevate hsTnI results.  Refer to the "Links" section for chest pain algorithms and additional  guidance. Performed at [facility], [address] [place], [place] [address]     PHQ2/9: Depression screen [facility] 2/9 05/15/2021 02/20/2021 09/14/2020 09/14/2020 05/23/2020  Decreased Interest 0 0 0 0 0  Down, Depressed, Hopeless 0 0 0 0 0  PHQ - 2 Score 0 0 0 0 0  Altered sleeping 0 - 0 - -  Tired, decreased energy 0 - 0 - -  Change in appetite 0 - 0 - -  Feeling bad or failure about yourself  0 - 0 - -  Trouble concentrating 0 - 0 - -  Moving slowly or fidgety/restless 0 - 0 - -  Suicidal thoughts 0 - 0 - -  PHQ-9 Score 0 - 0 - -  Difficult doing work/chores - - - - -    phq 9 is negative   Fall Risk: Fall Risk  05/15/2021 02/20/2021 09/14/2020 05/23/2020 08/21/2019  Falls in the past year? 0 1 0 0 0  Number falls in past yr: 0 0 0 0 0  Injury with Fall? 0 1 0 0 0  Risk for  fall due to : No Fall Risks History of fall(s) - - -  Follow up Falls prevention discussed Falls prevention discussed Falls prevention discussed - -      Functional Status Survey: Is the patient deaf or have difficulty hearing?: No Does the patient have difficulty seeing, even when wearing glasses/contacts?: No Does the patient have difficulty concentrating, remembering, or making decisions?: No Does the patient have difficulty walking or climbing stairs?: No Does the patient have difficulty dressing or bathing?: No Does the patient have difficulty doing errands alone such as visiting a doctor's office or shopping?: No    Assessment & Plan  1. GERD without esophagitis  - pantoprazole (PROTONIX) 20 MG tablet; Take 1 tablet (20 mg total) by mouth daily.  Dispense: 90 tablet; Refill: 0  2. Dyslipidemia  On life style modification   3. Other fatigue  Reviewed labs, we will  give rx vitamin D   4. Vitamin D deficiency  - Vitamin D, Ergocalciferol, (DRISDOL) 1.25 MG (50000 UNIT) CAPS capsule; Take 1 capsule (50,000 Units total) by mouth every 7 (seven) days.  Dispense: 12 capsule; Refill: 0  5. Obesity (BMI 30.0-34.9)  Discussed with the patient the risk posed by an increased BMI. Discussed importance of portion control, calorie counting and at least 150 minutes of physical activity weekly. Avoid sweet beverages and drink more water. Eat at least 6 servings of fruit and vegetables daily

## 2021-05-15 ENCOUNTER — Encounter: Payer: Self-pay | Admitting: Family Medicine

## 2021-05-15 ENCOUNTER — Ambulatory Visit (INDEPENDENT_AMBULATORY_CARE_PROVIDER_SITE_OTHER): Payer: Managed Care, Other (non HMO) | Admitting: Family Medicine

## 2021-05-15 VITALS — BP 122/78 | HR 94 | Temp 98.4°F | Resp 16 | Ht 66.0 in | Wt 186.0 lb

## 2021-05-15 DIAGNOSIS — K219 Gastro-esophageal reflux disease without esophagitis: Secondary | ICD-10-CM | POA: Diagnosis not present

## 2021-05-15 DIAGNOSIS — E785 Hyperlipidemia, unspecified: Secondary | ICD-10-CM

## 2021-05-15 DIAGNOSIS — R5383 Other fatigue: Secondary | ICD-10-CM | POA: Diagnosis not present

## 2021-05-15 DIAGNOSIS — E559 Vitamin D deficiency, unspecified: Secondary | ICD-10-CM

## 2021-05-15 DIAGNOSIS — E669 Obesity, unspecified: Secondary | ICD-10-CM

## 2021-05-15 MED ORDER — VITAMIN D (ERGOCALCIFEROL) 1.25 MG (50000 UNIT) PO CAPS
50000.0000 [IU] | ORAL_CAPSULE | ORAL | 0 refills | Status: DC
Start: 2021-05-15 — End: 2021-08-31

## 2021-05-15 MED ORDER — PANTOPRAZOLE SODIUM 20 MG PO TBEC: 20.0000 mg | DELAYED_RELEASE_TABLET | Freq: Every day | ORAL | 0 refills | Status: DC

## 2021-05-15 NOTE — Patient Instructions (Signed)
Food Choices for Gastroesophageal Reflux Disease, Adult When you have gastroesophageal reflux disease (GERD), the foods you eat and your eating habits are very important. Choosing the right foods can help ease the discomfort of GERD. Consider working with a dietitian to help you make healthy food choices. What are tips for following this plan? Reading food labels Look for foods that are low in saturated fat. Foods that have less than 5% of daily value (DV) of fat and 0 g of trans fats may help with your symptoms. Cooking Cook foods using methods other than frying. This may include baking, steaming, grilling, or broiling. These are all methods that do not need a lot of fat for cooking. To add flavor, try to use herbs that are low in spice and acidity. Meal planning  Choose healthy foods that are low in fat, such as fruits, vegetables, whole grains, low-fat dairy products, lean meats, fish, and poultry. Eat frequent, small meals instead of three large meals each day. Eat your meals slowly, in a relaxed setting. Avoid bending over or lying down until 2-3 hours after eating. Limit high-fat foods such as fatty meats or fried foods. Limit your intake of fatty foods, such as oils, butter, and shortening. Avoid the following as told by your health care provider: Foods that cause symptoms. These may be different for different people. Keep a food diary to keep track of foods that cause symptoms. Alcohol. Drinking large amounts of liquid with meals. Eating meals during the 2-3 hours before bed. Lifestyle Maintain a healthy weight. Ask your health care provider what weight is healthy for you. If you need to lose weight, work with your health care provider to do so safely. Exercise for at least 30 minutes on 5 or more days each week, or as told by your health care provider. Avoid wearing clothes that fit tightly around your waist and chest. Do not use any products that contain nicotine or tobacco. These  products include cigarettes, chewing tobacco, and vaping devices, such as e-cigarettes. If you need help quitting, ask your health care provider. Sleep with the head of your bed raised. Use a wedge under the mattress or blocks under the bed frame to raise the head of the bed. Chew sugar-free gum after mealtimes. What foods should I eat? Eat a healthy, well-balanced diet of fruits, vegetables, whole grains, low-fat dairy products, lean meats, fish, and poultry. Each person is different. Foods that may trigger symptoms in one person may not trigger any symptoms in another person. Work with your health care provider to identify foods that are safe for you. The items listed above may not be a complete list of recommended foods and beverages. Contact a dietitian for more information. What foods should I avoid? Limiting some of these foods may help manage the symptoms of GERD. Everyone is different. Consult a dietitian or your health care provider to help you identify the exact foods to avoid, if any. Fruits Any fruits prepared with added fat. Any fruits that cause symptoms. For some people this may include citrus fruits, such as oranges, grapefruit, pineapple, and lemons. Vegetables Deep-fried vegetables. [place] fries. Any vegetables prepared with added fat. Any vegetables that cause symptoms. For some people, this may include tomatoes and tomato products, chili peppers, onions and garlic, and horseradish. Grains Pastries or quick breads with added fat. Meats and other proteins High-fat meats, such as fatty beef or pork, hot dogs, ribs, ham, sausage, salami, and bacon. Fried meat or protein, including fried  fish and fried chicken. Nuts and nut butters, in large amounts. Dairy Whole milk and chocolate milk. Sour cream. Cream. Ice cream. Cream cheese. Milkshakes. Fats and oils Butter. Margarine. Shortening. Ghee. Beverages Coffee and tea, with or without caffeine. Carbonated beverages. Sodas. Energy  drinks. Fruit juice made with acidic fruits, such as orange or grapefruit. Tomato juice. Alcoholic drinks. Sweets and desserts Chocolate and cocoa. Donuts. Seasonings and condiments Pepper. Peppermint and spearmint. Added salt. Any condiments, herbs, or seasonings that cause symptoms. For some people, this may include curry, hot sauce, or vinegar-based salad dressings. The items listed above may not be a complete list of foods and beverages to avoid. Contact a dietitian for more information. Questions to ask your health care provider Diet and lifestyle changes are usually the first steps that are taken to manage symptoms of GERD. If diet and lifestyle changes do not improve your symptoms, talk with your health care provider about taking medicines. Where to find more information International Foundation for Gastrointestinal Disorders: aboutgerd.org Summary When you have gastroesophageal reflux disease (GERD), food and lifestyle choices may be very helpful in easing the discomfort of GERD. Eat frequent, small meals instead of three large meals each day. Eat your meals slowly, in a relaxed setting. Avoid bending over or lying down until 2-3 hours after eating. Limit high-fat foods such as fatty meats or fried foods. This information is not intended to replace advice given to you by your health care provider. Make sure you discuss any questions you have with your health care provider. Document Revised: 10/19/2019 Document Reviewed: 10/19/2019 Elsevier Patient Education  [place].

## 2021-05-22 ENCOUNTER — Ambulatory Visit: Payer: Managed Care, Other (non HMO) | Admitting: Family Medicine

## 2021-06-05 ENCOUNTER — Other Ambulatory Visit: Payer: Self-pay | Admitting: Family Medicine

## 2021-06-05 DIAGNOSIS — K219 Gastro-esophageal reflux disease without esophagitis: Secondary | ICD-10-CM

## 2021-06-09 ENCOUNTER — Other Ambulatory Visit: Payer: Self-pay | Admitting: Family Medicine

## 2021-06-09 DIAGNOSIS — K219 Gastro-esophageal reflux disease without esophagitis: Secondary | ICD-10-CM

## 2021-06-09 MED ORDER — PANTOPRAZOLE SODIUM 20 MG PO TBEC: 20.0000 mg | DELAYED_RELEASE_TABLET | Freq: Every day | ORAL | 0 refills | Status: DC

## 2021-06-28 ENCOUNTER — Ambulatory Visit (INDEPENDENT_AMBULATORY_CARE_PROVIDER_SITE_OTHER): Payer: Managed Care, Other (non HMO) | Admitting: Physician Assistant

## 2021-06-28 ENCOUNTER — Encounter: Payer: Self-pay | Admitting: Physician Assistant

## 2021-06-28 VITALS — BP 132/78 | HR 86 | Temp 98.0°F | Resp 16 | Ht 65.0 in | Wt 190.3 lb

## 2021-06-28 DIAGNOSIS — M654 Radial styloid tenosynovitis [de Quervain]: Secondary | ICD-10-CM | POA: Diagnosis not present

## 2021-06-28 DIAGNOSIS — M25531 Pain in right wrist: Secondary | ICD-10-CM

## 2021-06-28 NOTE — Patient Instructions (Addendum)
I am going to place physical therapy and occupational therapy  ?The referral team should contact you to set those apt up  ? ?You can continue wearing the thumb and wrist brace ?I recommend alternating Tylenol and Ibuprofen as needed ?You can also use warm compresses to the area as needed to relieve pain.  ? ?Follow up in about 2 months  ? ? ?

## 2021-06-28 NOTE — Progress Notes (Signed)
Acute Office Visit  Subjective:    Patient ID: Tanya Robinson, female    DOB: 08/07/1975, 46 y.o.   MRN: [id number]  Today's provider: Talitha Givens, MHS, PA-C Introduced myself to the patient as a Journalist, newspaper and provided education on APPs in clinical practice.    Chief Complaint  Patient presents with   Hand Pain    Right hand but mainly the fingers onset for 2-3 months. Pain has got worst through all fingers has had carpel tunnel/arthritis in the past.    Hand Pain   Patient is right hand dominant Patient is in today for right hand pain Reports she has a similar pain in her right thumb and was given an injection that seemed to help No recent injury to hand, bites or trauma  Reports pain is over medial phalanges and metacarpals States pain is not improved by soaking, stretching exercises or massage She has tried using her brace from previous thumb pain and this has not helped  Aggravating: reports pain is worse when she is typing Reports mild improvement with hand in a ball or gripping something with entire hand    Past Medical History:  Diagnosis Date   Anxiety    Headache     Past Surgical History:  Procedure Laterality Date   ABDOMINAL HYSTERECTOMY     TUBAL LIGATION  2009    Family History  Problem Relation Age of Onset   Hypertension Mother    Hyperlipidemia Mother    Diabetes Father     Social History   Socioeconomic History   Marital status: Married    Spouse name: [place]    Number of children: 2   Years of education: Not on file   Highest education level: High school graduate  Occupational History   Occupation: Warehouse manager: [employer]  Tobacco Use   Smoking status: Never   Smokeless tobacco: Never  Vaping Use   Vaping Use: Never used  Substance and Sexual Activity   Alcohol use: Not Currently    Comment: occasionally   Drug use: No   Sexual activity: Yes    Partners: Male    Birth control/protection: Surgical  Other  Topics Concern   Not on file  Social History Narrative   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 She started at [employer] April 2019, she has been working from home since March 2020 because of pandemic    She has been living with boyfriend ( together since 2011)   He was previously married, and he has twins from previously relationship.    Her two children, are grown now. A son and a daughter.    Daughter had a miscarriage in 2021   Social Determinants of Health   Financial Resource Strain: Low Risk    Difficulty of Paying Living Expenses: Not hard at all  Food Insecurity: No Food Insecurity   Worried About Charity fundraiser in the Last Year: Never true   Arboriculturist in the Last Year: Never true  Transportation Needs: No Transportation Needs   Lack of Transportation (Medical): No   Lack of Transportation (Non-Medical): No  Physical Activity: Sufficiently Active   Days of Exercise per Week: 3 days   Minutes of Exercise per Session: 60 min  Stress: No Stress Concern Present   Feeling of Stress : Only a little  Social Connections: Engineer, building services of Communication with Friends and Family:  More than three times a week   Frequency of Social Gatherings with Friends and Family: Three times a week   Attends Religious Services: More than 4 times per year   Active Member of Clubs or Organizations: Yes   Attends Archivist Meetings: Never   Marital Status: Married  Human resources officer Violence: Not At Risk   Fear of Current or Ex-Partner: No   Emotionally Abused: No   Physically Abused: No   Sexually Abused: No    Outpatient Medications Prior to Visit  Medication Sig Dispense Refill   Cholecalciferol (VITAMIN D) 2000 units CAPS Take 1 capsule (2,000 Units total) by mouth daily. 30 capsule 0   EPINEPHrine 0.3 mg/0.3 mL IJ SOAJ injection      pantoprazole (PROTONIX) 20 MG tablet Take 1 tablet (20 mg total) by mouth daily. 90 tablet 0   Vitamin  D, Ergocalciferol, (DRISDOL) 1.25 MG (50000 UNIT) CAPS capsule Take 1 capsule (50,000 Units total) by mouth every 7 (seven) days. 12 capsule 0   No facility-administered medications prior to visit.    Allergies  Allergen Reactions   Tea Hives   Bee Venom     Bee    Review of Systems     Objective:    Physical Exam Vitals reviewed.  Constitutional:      Appearance: Normal appearance.  HENT:     Head: Normocephalic and atraumatic.  Cardiovascular:     Pulses:          Radial pulses are 2+ on the right side and 2+ on the left side.  Musculoskeletal:     Right elbow: Normal. Normal range of motion. No tenderness.     Left elbow: Normal. Normal range of motion. No tenderness.     Right forearm: Normal.     Left forearm: Normal.     Right wrist: Tenderness, bony tenderness and snuff box tenderness present. No swelling or deformity. Decreased range of motion. Normal pulse.     Left wrist: No swelling, deformity or snuff box tenderness. Normal range of motion. Normal pulse.     Right hand: No swelling, deformity, lacerations or bony tenderness. Decreased range of motion. Decreased strength of thumb/finger opposition. Normal strength of wrist extension. Normal capillary refill. Normal pulse.     Left hand: No tenderness or bony tenderness. Normal range of motion. Decreased strength of thumb/finger opposition. Normal strength of wrist extension. Normal capillary refill. Normal pulse.     Comments: Tenderness over snuffbox and heads of metacarpals on right hand Right wrist and hand: Reduced ROM due to pain with the following: wrist flexion, ulnar deviation Positive Finkelstein test on right side   Right wrist and left wrist normal with the following ROM Wrist extension Radial deviation Opposition intact  Thumb abduction, adduction, flexion and extension all normal  Negative Phalens test  Supination and pronation are intact and nonpainful bilaterally  Neurological:     Mental  Status: She is alert.  Psychiatric:        Attention and Perception: Attention normal.        Mood and Affect: Mood and affect normal.        Speech: Speech normal.        Behavior: Behavior normal. Behavior is cooperative.    BP 132/78    Pulse 86    Temp 98 F (36.7 C) (Oral)    Resp 16    Ht 5' 5"  (1.651 m)    Wt 190 lb 4.8 oz (86.3 kg)  SpO2 99%    BMI 31.67 kg/m  Wt Readings from Last 3 Encounters:  06/28/21 190 lb 4.8 oz (86.3 kg)  05/15/21 186 lb (84.4 kg)  02/20/21 187 lb 3.2 oz (84.9 kg)    Health Maintenance Due  Topic Date Due   COLONOSCOPY (Pts 45-19yr Insurance coverage will need to be confirmed)  Never done    There are no preventive care reminders to display for this patient.   Lab Results  Component Value Date   TSH 1.590 03/06/2021   Lab Results  Component Value Date   WBC 5.7 05/01/2021   HGB 14.2 05/01/2021   HCT 41.8 05/01/2021   MCV 86.0 05/01/2021   PLT 292 05/01/2021   Lab Results  Component Value Date   NA 135 05/01/2021   K 3.6 05/01/2021   CO2 24 05/01/2021   GLUCOSE 117 (H) 05/01/2021   BUN 8 05/01/2021   CREATININE 0.99 05/01/2021   BILITOT <0.2 03/06/2021   ALKPHOS 64 03/06/2021   AST 13 03/06/2021   ALT 9 03/06/2021   PROT 7.0 03/06/2021   ALBUMIN 4.5 03/06/2021   CALCIUM 9.0 05/01/2021   ANIONGAP 7 05/01/2021   EGFR 69 03/06/2021   Lab Results  Component Value Date   CHOL 263 (H) 03/06/2021   Lab Results  Component Value Date   HDL 64 03/06/2021   Lab Results  Component Value Date   LDLCALC 184 (H) 03/06/2021   Lab Results  Component Value Date   TRIG 89 03/06/2021   Lab Results  Component Value Date   CHOLHDL 4.1 03/06/2021   Lab Results  Component Value Date   HGBA1C 5.7 (H) 03/06/2021       Assessment & Plan:   Problem List Items Addressed This Visit   None Visit Diagnoses     Right wrist pain    -  Primary   Relevant Orders   Ambulatory referral to Physical Therapy   Ambulatory referral to  Occupational Therapy   Tenosynovitis, de Quervain       Relevant Orders   Ambulatory referral to Physical Therapy   Ambulatory referral to Occupational Therapy      Problem List Items Addressed This Visit   None Visit Diagnoses     Tenosynovitis, de Quervain    -  Primary Acute, new problem Suspect this is part of larger tendon irritation from repetitive motions as there is involvement with fingers and wrist Recommend using her wrist brace, warm compresses, alternating Tylenol and Ibuprofen for pain as well as gentle stretching to prevent stiffness Will place referral to PT and OT to assist with more complete eval and management  Recommend follow up in 2 months to assess progress with PT and OT  May need to refer to Ortho and pursue imaging in case of persistent symptoms    Relevant Orders   Ambulatory referral to Physical Therapy   Ambulatory referral to Occupational Therapy   Right wrist pain     Acute, new problem Suspect this is likely an overuse injury as it is exacerbated by repetitive motions from typing Suspect inflammation of dorsal tendon sheath, potentially de Quervain tenosynovitis as well given positive Finkelstein testing Recommend using her wrist brace, warm compresses, alternating Tylenol and Ibuprofen for pain as well as gentle stretching to prevent stiffness Will place referral to PT and OT to assist with more complete eval and management  Recommend follow up in 2 months to assess progress with PT and OT  May need  to make referral to ortho if symptoms are not improving for steroid injection    Relevant Orders   Ambulatory referral to Physical Therapy   Ambulatory referral to Occupational Therapy        Return in about 2 months (around 08/28/2021) for wrist pain .   I, Casen Pryor E Stefen Juba, PA-C, have reviewed all documentation for this visit. The documentation on 06/28/21 for the exam, diagnosis, procedures, and orders are all accurate and complete.   Kishana Battey, [place] MPH [facility]   No orders of the defined types were placed in this encounter.    Chayil Gantt E Sonda Coppens, PA-C

## 2021-06-29 ENCOUNTER — Other Ambulatory Visit: Payer: Self-pay

## 2021-06-29 ENCOUNTER — Ambulatory Visit: Payer: Managed Care, Other (non HMO) | Attending: Physician Assistant

## 2021-06-29 DIAGNOSIS — M25531 Pain in right wrist: Secondary | ICD-10-CM | POA: Diagnosis not present

## 2021-06-29 DIAGNOSIS — M654 Radial styloid tenosynovitis [de Quervain]: Secondary | ICD-10-CM | POA: Diagnosis present

## 2021-06-29 DIAGNOSIS — M6281 Muscle weakness (generalized): Secondary | ICD-10-CM | POA: Diagnosis present

## 2021-06-30 NOTE — Therapy (Signed)
[place] MAIN [facility] SERVICES [address] [place], [place], [address] Phone: [phone]   Fax:  [phone]  Occupational Therapy Evaluation  Patient Details  Name: Tanya Robinson MRN: [id number] Date of Birth: 10/26/1975 Referring Provider (OT): Talitha Givens, [place]   Encounter Date: 06/29/2021   OT End of Session - 06/30/21 1612     Visit Number 1    Number of Visits 10    Date for OT Re-Evaluation 09/06/21    OT Start Time 1350    OT Stop Time 1430    OT Time Calculation (min) 40 min    Activity Tolerance Patient tolerated treatment well    Behavior During Therapy [facility] : [facility] for tasks assessed/performed             Past Medical History:  Diagnosis Date   Anxiety    Headache     Past Surgical History:  Procedure Laterality Date   ABDOMINAL HYSTERECTOMY     TUBAL LIGATION  2009    There were no vitals filed for this visit.   Subjective Assessment - 06/30/21 1548     Subjective  "I type all day."    Pertinent History R carpal tunnel, R DeQuervain's    Limitations R wrist and thumb pain, R hand weakness, stiffness in R wrist    Special Tests Tinel test negative; held off on Phalen's test as pt reported she performed this at her recent MD appointment with great discomfort, so assume + Phalen's test; Finkelstein's test did not cause discomfort at the APL or EPL into the first extensor compartment of the wrist, but pt does report soreness distal to this compartment.    Patient Stated Goals Decrease pain in R hand    Currently in Pain? Yes    Pain Score 10-Worst pain ever   Pt reports 41+ pain with certain movements, 2/87 pain at rest   Pain Location Hand    Pain Orientation Right    Pain Descriptors / Indicators Aching;Shooting;Sharp;Throbbing;Sore    Pain Type Chronic pain    Pain Radiating Towards R volar and dorsal wrist, dorsal thumb, dorsal hand at MCPs and PIPs    Pain Onset More than a month ago    Pain Frequency Intermittent     Aggravating Factors  typing, wrist flex/ext, gripping    Pain Relieving Factors rest, positioning    Effect of Pain on Daily Activities Pt using L non-dominant arm for most daily activities; pt has pain with work tasks (ie typing)    Multiple Pain Sites No               [facility] OT Assessment - 06/30/21 0001       Assessment   Medical Diagnosis R DeQuervain's, wrist pain    Referring Provider (OT) Erin Mecum, PA    Onset Date/Surgical Date 12/22/20   pt estimates pain for the last 6-7 months   Hand Dominance Right    Next MD Visit 2 months to PA    Prior Therapy none      Precautions   Required Braces or Orthoses Other Brace/Splint    Other Brace/Splint Pt wearing wrist brace with thumb component, soft/off the shelf; would benefit from custom thumb spica      Restrictions   Weight Bearing Restrictions No      Prior Function   Vocation Full time employment    Vocation Requirements works remotely for [employer]; pt reports she's been working a lot of overtime  and job requires constant typing    Leisure watch movies, be with husband and 67monthold granddaughter      ADL   ADL comments P is indep with all ADLs, but is using L hand for all tasks; 2 handed tasks are painful      Written Expression   Dominant Hand Right    Handwriting 100% legible    Written Experience Within Functional Limits      Observation/Other Assessments   Focus on Therapeutic Outcomes (FOTO)  53      Sensation   Light Touch Impaired Detail    Light Touch Impaired Details Impaired RUE   Carpal tunnel hx R hand; Pt has typical intermittent tingly in R hand along median nerve distribution with prolonged wrist flex or ext (sometimes when waking up from sleep, occasionally with typing)     Coordination   Gross Motor Movements are Fluid and Coordinated Yes    Fine Motor Movements are Fluid and Coordinated Yes      Edema   Edema no visible edema in either hand      AROM   Overall AROM Comments bilat  wrist flex 90; R wrist ext 45 (painful), L 58      Strength   Overall Strength Comments bilat wrist flex/ext 4+, but R with pain during flexion      Hand Function   Right Hand Grip (lbs) 11    Right Hand Lateral Pinch 20 lbs    Right Hand 3 Point Pinch 13 lbs    Left Hand Grip (lbs) 43    Left Hand Lateral Pinch 25 lbs    Left 3 point pinch 16 lbs            Occupational Therapy Evaluation: Pt is a 46y/o female present d/t ongoing R hand pain.  Pt reports she has been diagnosed with carpal tunnel, and has had DeQuervain's symptoms for 6-7 months.  Pt also endorses pain in R hand at dorsal MCPs and PIPs.  Very tender wrist with extension, soreness along thumb but minimal pain at first extensor compartment during provocative Finkelstein's test.  Pt presents with decreased R wrist extension and weakness throughout R hand.  Pt works remotely for [employer] types all day.  Has a new soft wrist splint with thumb component, but just started wearing this yesterday; OT recommending custom thumb spica.  Encouraged pt begin icing R thumb/wrist 3x daily x20 min.  Began education re: ergonomic work space; see below.  Pt will benefit from skilled OT for splinting, modalities for pain management, HEP instruction, and joint protections strategies.  Pt will have thumb spica made next [facility]06[facility] will plan to begin iontophoresis week of 07/10/21 so pt can have 2 treatments in a given week.  Pt in agreement with poc.   Self Care: Provided/reviewed education for ergonomic workspace modifications for typing at desk.  Reinforced need for neutral wrist positioning while typing.  Handout given.  Further training/follow up needed.    OT Education - 06/30/21 1611     Education Details Role of OT/goals/poc, education provided for ergonomic workspace (reinforced neutral wrist position when sleeping and typing); begin icing R thumb/wrist 3x daily x20 min    Person(s) Educated Patient     Methods Explanation;Verbal cues;Handout    Comprehension Verbalized understanding;Verbal cues required;Need further instruction              OT Short Term Goals - 06/30/21 18638  OT SHORT TERM GOAL #1   Title Pt will be indep to verbalize 2 strategies to manage/minimize carpal tunnel and Dequervain's symptoms in R hand.    Baseline Eval: initiated at eval, further training needed    Time 2    Period Weeks    Status New    Target Date 07/14/21      OT SHORT TERM GOAL #2   Title Pt will be indep with donning/doffing/wearing schedule of R hand custom splint.    Baseline Eval: splint fabrication planned for 07/06/21    Time 2    Period Weeks    Status New    Target Date 07/14/21               OT Long Term Goals - 06/30/21 1620       OT LONG TERM GOAL #1   Title Pt will increase FOTO score to 60 or better to indicate increased functional performance.    Baseline Eval: FOTO 53.    Time 10    Period Weeks    Status New    Target Date 09/06/21      OT LONG TERM GOAL #2   Title Pt will tolerate therapeutic modalities to R hand for pain management and report R hand pain at 5 or less with activity.    Baseline Eval: pt reports 10+/10 pain with activity in R hand.    Time 10    Period Weeks    Status New    Target Date 09/06/21      OT LONG TERM GOAL #3   Title Pt will increase R grip strength by 10 or more lbs to enable improved ability to hold and carry ADL supplies in R dominant hand.    Baseline Eval: R grip 11 (L 43); pt uses L hand to hold/carry supplies    Time 10    Period Weeks    Status New    Target Date 09/06/21      OT LONG TERM GOAL #4   Title Pt will increase R wrist ext by 10 or more degrees for better flexibility during ADL and work tasks.    Baseline Eval: R wrist ext 45 degrees, L 58    Time 10    Period Weeks    Status New    Target Date 09/06/21      OT LONG TERM GOAL #5   Title Pt will make 2 ergonomic-friendly workspace changes to  reduce risk of recurring pain and worsening musculoskeletal injury.    Baseline Eval: Ergonomic workspace education initiated at eval, further training needed    Time 10    Period Weeks    Status New    Target Date 09/06/21               Plan - 06/30/21 1614     Clinical Impression Statement Pt is a 46 y/o female present d/t ongoing R hand pain.  Pt reports she has been diagnosed with carpal tunnel, and has had DeQuervain's symptoms for 6-7 months.  Pt also endorses pain in R hand at dorsal MCPs and PIPs.  Very tender wrist with extension, soreness along thumb but minimal pain at first extensor compartment during provocative Finkelstein's test.  Pt presents with decreased R wrist extension and weakness throughout R hand.  Pt works remotely for [employer] and types all day.  Has a new soft wrist splint with thumb component, but just started wearing this yesterday; OT recommending custom thumb  spica.  Encouraged pt begin icing R thumb/wrist 3x daily x20 min.  Began education re: ergonomic work space; see below.  Pt will benefit from skilled OT for splinting, modalities for pain management, HEP instruction, and joint protections strategies.  Pt will have thumb spica made next week on 07/06/21 at [facility]; will plan to begin iontophoresis week of 07/10/21 so pt can have 2 treatments in a given week.  Pt in agreement with poc.    OT Occupational Profile and History Problem Focused Assessment - Including review of records relating to presenting problem    Occupational performance deficits (Please refer to evaluation for details): ADL's;IADL's;Leisure;Work    Marketing executive / Function / Physical Skills ADL;UE functional use;Decreased knowledge of precautions;Sensation;Body mechanics;Flexibility;IADL;Pain;Dexterity;FMC;Strength;Edema;ROM    Rehab Potential Good    Clinical Decision Making Limited treatment options, no task modification necessary    Comorbidities Affecting Occupational Performance:  May have comorbidities impacting occupational performance    Modification or Assistance to Complete Evaluation  No modification of tasks or assist necessary to complete eval    OT Frequency 2x / week    OT Duration --   4-5 weeks   OT Treatment/Interventions Self-care/ADL training;Cryotherapy;Paraffin;Therapeutic exercise;DME and/or AE instruction;Ultrasound;Manual Therapy;Splinting;Moist Heat;Iontophoresis;Contrast Bath;Passive range of motion;Therapeutic activities;Patient/family education    Plan plan for fabrication of custom thumb spica next week as [employer] [place] ice/rest/neutral wrist position with typing/sleeping; will initiate HEP next session    Consulted and Agree with Plan of Care Patient             Patient will benefit from skilled therapeutic intervention in order to improve the following deficits and impairments:   Body Structure / Function / Physical Skills: ADL, UE functional use, Decreased knowledge of precautions, Sensation, Body mechanics, Flexibility, IADL, Pain, Dexterity, FMC, Strength, Edema, ROM       Visit Diagnosis: Pain in right wrist  De Quervain's disease (tenosynovitis)  Muscle weakness (generalized)    Problem List Patient Active Problem List   Diagnosis Date Noted   GERD without esophagitis 05/15/2021   Cervical high risk HPV (human papillomavirus) test positive 07/30/2016   Bee sting allergy 07/28/2015   Dyslipidemia 11/02/2014   Migraine without aura and responsive to treatment 11/02/2014   Vitamin D deficiency 11/02/2014   Urinary incontinence in female 11/02/2014   Leta Speller, MS, OTR/L  Darleene Cleaver, OT 06/30/2021, 4:42 PM  [place] [facility]IN [facility] SERVICES [address] [place], [place], [address] Phone: [phone]   Fax:  [phone]  Name: Tanya Robinson MRN: [id number] Date of Birth: 1975-07-06

## 2021-07-04 ENCOUNTER — Other Ambulatory Visit: Payer: Self-pay

## 2021-07-04 ENCOUNTER — Ambulatory Visit: Payer: Managed Care, Other (non HMO)

## 2021-07-04 DIAGNOSIS — M654 Radial styloid tenosynovitis [de Quervain]: Secondary | ICD-10-CM

## 2021-07-04 DIAGNOSIS — M6281 Muscle weakness (generalized): Secondary | ICD-10-CM

## 2021-07-04 DIAGNOSIS — M25531 Pain in right wrist: Secondary | ICD-10-CM

## 2021-07-05 NOTE — Therapy (Signed)
[place] ?[facility] ?[place], [place], [address] ?Phone: [phone]   Fax:  [phone] ? ?Occupational Therapy Treatment ? ?Patient Details  ?Name: Tanya Robinson ?MRN: [id number] ?Date of Birth: Jul 12, 1975 ?Referring Provider (OT): Talitha Givens, PA ? ? ?Encounter Date: 07/04/2021 ? ? OT End of Session - 07/05/21 1303   ? ? Visit Number 2   ? Number of Visits 10   ? Date for OT Re-Evaluation 09/06/21   ? OT Start Time 1345   ? OT Stop Time 1430   ? OT Time Calculation (min) 45 min   ? Activity Tolerance Patient tolerated treatment well   ? Behavior During Therapy [facility] for tasks assessed/performed   ? ?  ?  ? ?  ? ? ?Past Medical History:  ?Diagnosis Date  ? Anxiety   ? Headache   ? ? ?Past Surgical History:  ?Procedure Laterality Date  ? ABDOMINAL HYSTERECTOMY    ? TUBAL LIGATION  2009  ? ? ?There were no vitals filed for this visit. ? ? Subjective Assessment - 07/04/21 1259   ? ? Subjective  Pt reports making changes to her screen placement and height of her keyboard in her workspace at home, per OT recommendation.   ? Pertinent History R carpal tunnel, R DeQuervain's   ? Limitations R wrist and thumb pain, R hand weakness, stiffness in R wrist   ? Special Tests Tinel test negative; held off on Phalen's test as pt reported she performed this at her recent MD appointment with great discomfort, so assume + Phalen's test; Finkelstein's test did not cause discomfort at the APL or EPL into the first extensor compartment of the wrist, but pt does report soreness distal to this compartment.   ? Patient Stated Goals Decrease pain in R hand   ? Currently in Pain? Yes   ? Pain Score 8    ? Pain Location Hand   ? Pain Orientation Right   ? Pain Descriptors / Indicators Aching;Shooting;Sharp   ? Pain Type Chronic pain   ? Pain Radiating Towards R volar and dorsal wrist, dorsal thumb, dorsal hand at MCPs and PIPs   ? Pain Onset More than a month ago   ? Pain Frequency  Intermittent   ? Aggravating Factors  typing, wrist flex/ext, gripping   ? Pain Relieving Factors rest, positioning, increased use of R hand splint   ? Effect of Pain on Daily Activities Pt usiing L non-dominant arm for most daily activities; pain with work tasks (typing)   ? Multiple Pain Sites No   ? ?  ?  ? ?  ? ?Occupational Therapy Treatment: ?Paraffin:  ?X10 min for R hand pain management/muscle relaxation in prep for therapeutic exercises. ? ?Therapeutic Exercise: ?Issued pink theraputty and instructed pt in strengthening and coordination exercises for R hand, including gross grasping, lateral/2 point/3 point pinching, and digit abd/add.  Able to return demo with intermittent vc for technique to improve quality of movement.  Encouraged completion 5-10 min, 1-2x per day; maintain low reps to avoid aggravating joints and tendons in R hand.  Instructed pt in active R wrist mobility x10 all planes.  Issued handout for and completed tendon glides in R hand x 5 each, tactile and verbal cues needed for accurate technique.  Reinforced need for frequent stretch/ROM for wrists/hands during the work day.  Pt verbalized understanding.   ? ?Self Care: ?Reviewed recommendations for workspace changes; pt reports that she  adjusted her screen height and distance of her computer, raised her keyboard, and is now typing with 90 degree elbow bend as recommended.  Reviewed joint protection strategies for hands, including allowing frequent rest breaks with tasks that engage hands into FM or gripping tasks, grip tools/supplies with the least amount of force necessary, try to avoid repetitive gripping/pinching when able.  Pt verbalized understanding.  ? ?Response to Treatment: ?Good tolerance to therapeutic exercises this day.  Handouts given and pt returns demo with min vc/tactile cues for technique.  Pt made some workspace changes according to ergonomic handout given last session.  Pt has visit for custom splinting on Thurs.  Pt  will continue to benefit from skilled OT for splinting, modalities for pain management, HEP instruction, and joint protections strategies.   ? ? ? OT Education - 07/04/21 1303   ? ? Education Details HEP   ? Person(s) Educated Patient   ? Methods Explanation;Verbal cues;Handout;Tactile cues;Demonstration   ? Comprehension Verbalized understanding;Verbal cues required;Need further instruction;Returned demonstration;Tactile cues required   ? ?  ?  ? ?  ? ? ? OT Short Term Goals - 06/30/21 1618   ? ?  ? OT SHORT TERM GOAL #1  ? Title Pt will be indep to verbalize 2 strategies to manage/minimize carpal tunnel and Dequervain's symptoms in R hand.   ? Baseline Eval: initiated at eval, further training needed   ? Time 2   ? Period Weeks   ? Status New   ? Target Date 07/14/21   ?  ? OT SHORT TERM GOAL #2  ? Title Pt will be indep with donning/doffing/wearing schedule of R hand custom splint.   ? Baseline Eval: splint fabrication planned for 07/06/21   ? Time 2   ? Period Weeks   ? Status New   ? Target Date 07/14/21   ? ?  ?  ? ?  ? ? ? ? OT Long Term Goals - 06/30/21 1620   ? ?  ? OT LONG TERM GOAL #1  ? Title Pt will increase FOTO score to 60 or better to indicate increased functional performance.   ? Baseline Eval: FOTO 53.   ? Time 10   ? Period Weeks   ? Status New   ? Target Date 09/06/21   ?  ? OT LONG TERM GOAL #2  ? Title Pt will tolerate therapeutic modalities to R hand for pain management and report R hand pain at 5 or less with activity.   ? Baseline Eval: pt reports 10+/10 pain with activity in R hand.   ? Time 10   ? Period Weeks   ? Status New   ? Target Date 09/06/21   ?  ? OT LONG TERM GOAL #3  ? Title Pt will increase R grip strength by 10 or more lbs to enable improved ability to hold and carry ADL supplies in R dominant hand.   ? Baseline Eval: R grip 11 (L 43); pt uses L hand to hold/carry supplies   ? Time 10   ? Period Weeks   ? Status New   ? Target Date 09/06/21   ?  ? OT LONG TERM GOAL #4  ? Title  Pt will increase R wrist ext by 10 or more degrees for better flexibility during ADL and work tasks.   ? Baseline Eval: R wrist ext 45 degrees, L 58   ? Time 10   ? Period Weeks   ? Status New   ?  Target Date 09/06/21   ?  ? OT LONG TERM GOAL #5  ? Title Pt will make 2 ergonomic-friendly workspace changes to reduce risk of recurring pain and worsening musculoskeletal injury.   ? Baseline Eval: Ergonomic workspace education initiated at eval, further training needed   ? Time 10   ? Period Weeks   ? Status New   ? Target Date 09/06/21   ? ?  ?  ? ?  ? ? ? ? Plan - 07/04/21 1450   ? ? Clinical Impression Statement Good tolerance to therapeutic exercises this day.  Handouts given and pt returns demo with min vc/tactile cues for technique.  Pt made some workspace changes according to ergonomic handout given last session.  Pt has visit for custom splinting on Thurs.  Pt will continue to benefit from skilled OT for splinting, modalities for pain management, HEP instruction, and joint protections strategies.   ? OT Occupational Profile and History Problem Focused Assessment - Including review of records relating to presenting problem   ? Occupational performance deficits (Please refer to evaluation for details): ADL's;IADL's;Leisure;Work   ? Body Structure / Function / Physical Skills ADL;UE functional use;Decreased knowledge of precautions;Sensation;Body mechanics;Flexibility;IADL;Pain;Dexterity;FMC;Strength;Edema;ROM   ? Rehab Potential Good   ? Clinical Decision Making Limited treatment options, no task modification necessary   ? Comorbidities Affecting Occupational Performance: May have comorbidities impacting occupational performance   ? Modification or Assistance to Complete Evaluation  No modification of tasks or assist necessary to complete eval   ? OT Frequency 2x / week   ? OT Duration --   4-5 weeks  ? OT Treatment/Interventions Self-care/ADL training;Cryotherapy;Paraffin;Therapeutic exercise;DME and/or AE  instruction;Ultrasound;Manual Therapy;Splinting;Moist Heat;Iontophoresis;Contrast Bath;Passive range of motion;Therapeutic activities;Patient/family education   ? Plan plan for fabrication of custom thumb spica

## 2021-07-06 ENCOUNTER — Other Ambulatory Visit: Payer: Self-pay

## 2021-07-06 ENCOUNTER — Ambulatory Visit: Payer: Managed Care, Other (non HMO) | Admitting: Occupational Therapy

## 2021-07-06 DIAGNOSIS — M25531 Pain in right wrist: Secondary | ICD-10-CM | POA: Diagnosis not present

## 2021-07-06 NOTE — Therapy (Signed)
[place] ?[facility] ?[address] ?[place], [place], [address] ?Phone: [phone]   Fax:  [phone] ? ?Occupational Therapy Treatment ? ?Patient Details  ?Name: Tanya Robinson ?MRN: [id number] ?Date of Birth: [birth year] ?Referring Provider (OT): Talitha Givens, PA ? ? ?Encounter Date: 07/06/2021 ? ? OT End of Session - 07/06/21 1523   ? ? Visit Number 3   ? Number of Visits 10   ? Date for OT Re-Evaluation 09/06/21   ? OT Start Time 1446   ? OT Stop Time 1520   ? OT Time Calculation (min) 34 min   ? Activity Tolerance Patient tolerated treatment well   ? Behavior During Therapy [facility] for tasks assessed/performed   ? ?  ?  ? ?  ? ? ?Past Medical History:  ?Diagnosis Date  ? Anxiety   ? Headache   ? ? ?Past Surgical History:  ?Procedure Laterality Date  ? ABDOMINAL HYSTERECTOMY    ? TUBAL LIGATION  2009  ? ? ?There were no vitals filed for this visit. ? ? Subjective Assessment - 07/06/21 1521   ? ? Subjective  [name] send me for a custom splint for my thumb - I am wearing this one at night time too but fingers still numb in the morning or at night time- hand swollen over the back the last 2-3 months. Grandbaby about 9 months now- symptoms I had in past but got it again about 6-7 months ago   ? Currently in Pain? Yes   ? Pain Score 6    8 /10 RD of wrist  ? ?  ?  ? ?  ? ? ? ? ?  Patient is a patient of [name], Warden/ranger at [facility] ?This OT fabricated a custom right thumb spica splint today over a thumb and wrist neoprene wrap per OT request ?Patient able to do three-point pinch and splint ?Pt report no pain in splint - able to write and hold baby bottle ?Patient was educated on donning, doffing and wearing of splint ?Patient verbalized understanding for precautions and wearing of splint ?During splint making patient was educated on modifications and joint protection with taking care or playing with grand baby, using phone ? ? ? ? ? ? ? ? ? ? ? ? ? ? ? ? ? ?  OT Education - 07/06/21 1523   ? ? Education Details SPlint wearing   ? Person(s) Educated Patient;Spouse   ? Methods Explanation;Verbal cues;Handout;Tactile cues;Demonstration   ? Comprehension Verbalized understanding;Verbal cues required;Need further instruction;Returned demonstration;Tactile cues required   ? ?  ?  ? ?  ? ? ? OT Short Term Goals - 06/30/21 1618   ? ?  ? OT SHORT TERM GOAL #1  ? Title Pt will be indep to verbalize 2 strategies to manage/minimize carpal tunnel and Dequervain's symptoms in R hand.   ? Baseline Eval: initiated at eval, further training needed   ? Time 2   ? Period Weeks   ? Status New   ? Target Date 07/14/21   ?  ? OT SHORT TERM GOAL #2  ? Title Pt will be indep with donning/doffing/wearing schedule of R hand custom splint.   ? Baseline Eval: splint fabrication planned for 07/06/21   ? Time 2   ? Period Weeks   ? Status New   ? Target Date 07/14/21   ? ?  ?  ? ?  ? ? ? ? OT Long Term  Goals - 06/30/21 1620   ? ?  ? OT LONG TERM GOAL #1  ? Title Pt will increase FOTO score to 60 or better to indicate increased functional performance.   ? Baseline Eval: FOTO 53.   ? Time 10   ? Period Weeks   ? Status New   ? Target Date 09/06/21   ?  ? OT LONG TERM GOAL #2  ? Title Pt will tolerate therapeutic modalities to R hand for pain management and report R hand pain at 5 or less with activity.   ? Baseline Eval: pt reports 10+/10 pain with activity in R hand.   ? Time 10   ? Period Weeks   ? Status New   ? Target Date 09/06/21   ?  ? OT LONG TERM GOAL #3  ? Title Pt will increase R grip strength by 10 or more lbs to enable improved ability to hold and carry ADL supplies in R dominant hand.   ? Baseline Eval: R grip 11 (L 43); pt uses L hand to hold/carry supplies   ? Time 10   ? Period Weeks   ? Status New   ? Target Date 09/06/21   ?  ? OT LONG TERM GOAL #4  ? Title Pt will increase R wrist ext by 10 or more degrees for better flexibility during ADL and work tasks.   ? Baseline Eval: R wrist  ext 45 degrees, L 58   ? Time 10   ? Period Weeks   ? Status New   ? Target Date 09/06/21   ?  ? OT LONG TERM GOAL #5  ? Title Pt will make 2 ergonomic-friendly workspace changes to reduce risk of recurring pain and worsening musculoskeletal injury.   ? Baseline Eval: Ergonomic workspace education initiated at eval, further training needed   ? Time 10   ? Period Weeks   ? Status New   ? Target Date 09/06/21   ? ?  ?  ? ?  ? ? ? ? ? ? ? ? Plan - 07/06/21 1524   ? ? Clinical Impression Statement Fabricated custom thumb spica over thumb and wrist wrap to use during day and as night as needed  per request from OT at [facility]- pt ed on donning, doffing and wearing of splint as well as some modifications to tasks with her grandbaby - picking up , diapers, fasteners, phone and texting  as well as pushing buttons. Pt to use palms and flexors to hold and pick up objects - pt followed by OT over at [facility] to start Ionto with dexamethazone next week   ? OT Occupational Profile and History Problem Focused Assessment - Including review of records relating to presenting problem   ? Occupational performance deficits (Please refer to evaluation for details): ADL's;IADL's;Leisure;Work   ? Body Structure / Function / Physical Skills ADL;UE functional use;Decreased knowledge of precautions;Sensation;Body mechanics;Flexibility;IADL;Pain;Dexterity;FMC;Strength;Edema;ROM   ? Rehab Potential Good   ? Clinical Decision Making Limited treatment options, no task modification necessary   ? Comorbidities Affecting Occupational Performance: May have comorbidities impacting occupational performance   ? Modification or Assistance to Complete Evaluation  No modification of tasks or assist necessary to complete eval   ? OT Frequency 2x / week   ? OT Duration 4 weeks   ? OT Treatment/Interventions Self-care/ADL training;Cryotherapy;Paraffin;Therapeutic exercise;DME and/or AE instruction;Ultrasound;Manual Therapy;Splinting;Moist  Heat;Iontophoresis;Contrast Bath;Passive range of motion;Therapeutic activities;Patient/family education   ? Consulted and Agree with Plan  of Care Patient   ? ?  ?  ? ?  ? ? ?Patient will benefit from skilled therapeutic intervention in order to improve the following deficits and impairments:   ?Body Structure / Function / Physical Skills: ADL, UE functional use, Decreased knowledge of precautions, Sensation, Body mechanics, Flexibility, IADL, Pain, Dexterity, FMC, Strength, Edema, ROM ?  ?  ? ? ?Visit Diagnosis: ?Muscle weakness (generalized) ? ?Pain in right wrist ? ?De Quervain's disease (tenosynovitis) ? ? ? ?Problem List ?Patient Active Problem List  ? Diagnosis Date Noted  ? GERD without esophagitis 05/15/2021  ? Cervical high risk HPV (human papillomavirus) test positive 07/30/2016  ? Bee sting allergy 07/28/2015  ? Dyslipidemia 11/02/2014  ? Migraine without aura and responsive to treatment 11/02/2014  ? Vitamin D deficiency 11/02/2014  ? Urinary incontinence in female 11/02/2014  ? ? ?Rosalyn Gess, OTR/L,CLT ?07/06/2021, 3:27 PM ? ?[place] [facility] ?[address] ?[place], [place], [address] ?Phone: [phone]   Fax:  [phone] ? ?Name: Tanya Robinson ?MRN: [id number] ?Date of Birth: 18-Mar-1976 ? ?

## 2021-07-10 ENCOUNTER — Ambulatory Visit: Payer: Managed Care, Other (non HMO)

## 2021-07-10 ENCOUNTER — Other Ambulatory Visit: Payer: Self-pay

## 2021-07-10 DIAGNOSIS — M654 Radial styloid tenosynovitis [de Quervain]: Secondary | ICD-10-CM

## 2021-07-10 DIAGNOSIS — M25531 Pain in right wrist: Secondary | ICD-10-CM

## 2021-07-10 NOTE — Therapy (Signed)
[place] ?[facility] ?[place], [place], [address] ?Phone: [phone]   Fax:  [phone] ? ?Occupational Therapy Treatment ? ?Patient Details  ?Name: Tanya Robinson ?MRN: [id number] ?Date of Birth: 01/17/1976 ?Referring Provider (OT): Talitha Givens, PA ? ? ?Encounter Date: 07/10/2021 ? ? OT End of Session - 07/10/21 1322   ? ? Visit Number 4   ? Number of Visits 10   ? Date for OT Re-Evaluation 09/06/21   ? OT Start Time 1300   ? OT Stop Time 1345   ? OT Time Calculation (min) 45 min   ? Activity Tolerance Patient tolerated treatment well   ? Behavior During Therapy [facility] for tasks assessed/performed   ? ?  ?  ? ?  ? ? ?Past Medical History:  ?Diagnosis Date  ? Anxiety   ? Headache   ? ? ?Past Surgical History:  ?Procedure Laterality Date  ? ABDOMINAL HYSTERECTOMY    ? TUBAL LIGATION  2009  ? ? ?There were no vitals filed for this visit. ? ? Subjective Assessment - 07/10/21 1315   ? ? Subjective  "I've actually been typing with my splint on.  I like it better than the other splint I had."   ? Pertinent History R carpal tunnel, R DeQuervain's   ? Limitations R wrist and thumb pain, R hand weakness, stiffness in R wrist   ? Special Tests Tinel test negative; held off on Phalen's test as pt reported she performed this at her recent MD appointment with great discomfort, so assume + Phalen's test; Finkelstein's test did not cause discomfort at the APL or EPL into the first extensor compartment of the wrist, but pt does report soreness distal to this compartment.   ? Patient Stated Goals Decrease pain in R hand   ? Currently in Pain? Yes   ? Pain Score 5    ? Pain Location Hand   ? Pain Orientation Right   ? Pain Descriptors / Indicators Aching   ? Pain Type Chronic pain   ? Pain Radiating Towards R volar and dorsal wrist, dorsal thumb, dorsal hand at MCPs and PIPs   ? Pain Onset More than a month ago   ? Pain Frequency Intermittent   ? Aggravating Factors  typing,  wrist flex/ext, gripping   ? Pain Relieving Factors rest, positioning, increased use of R hand splint   ? Effect of Pain on Daily Activities Pt using L non-dominant arm for most daily activities; pain with work tasks (typing)   ? Multiple Pain Sites No   ? ?  ?  ? ?  ? ?Occupational Therapy Treatment: ?Iontophoresis: 8 min  ?2cc dexamethasone patch applied to R volar aspect of thumb; 20 min tx, 40 mA/min, current 2.0 for pain and inflammation.  Pt tolerated well.   ? ?Therapeutic Exercise: ?Wrist maze x5 reps R wrist, min vc for positioning R elbow bent at side for max wrist ROM potential.  Completed hand gripper set at moderate resistance with 1 green band x5 sets 10 reps each hand, working to increase grip strength for self care tasks.  Rest between sets.  Performed wrist stretch with 2lb weight hang for R wrist flex/ext/ulnar deviation x20 sec hold each direction.  Facilitated lateral and 3 point pinch for R hand with yellow and red clothespins x1 trial each pinch type followed by wrist stretch with 2 lb weight hang noted above.   ?  ?Response to Treatment: ?Pt  reports good tolerance to new thumb spica and reports wearing it for most of the day and night without issue.  Pt states she doffs splint for exercises but otherwise has been able to keep it on even to type for work.  Pt reported slight discomfort with putty exercises; encouraged pt to pick a few exercises at a time and perform during work breaks, pick a few more for her next work break; avoid doing all at once.  Good tolerance to iontophoresis this day.  Pt reports good compliance with ice; encouraged pt continue several times daily.  Pt will continue to benefit from skilled OT to provide modalities, manage splinting, progress HEP as tolerated in order to decrease hand pain and maximize functional use of R hand for daily tasks.   ? ? ? ? OT Education - 07/10/21 1322   ? ? Education Details Splint wearing, HEP review   ? Person(s) Educated Patient;Spouse    ? Methods Explanation;Verbal cues;Handout;Tactile cues;Demonstration   ? Comprehension Verbalized understanding;Verbal cues required;Need further instruction;Returned demonstration;Tactile cues required   ? ?  ?  ? ?  ? ? ? OT Short Term Goals - 06/30/21 1618   ? ?  ? OT SHORT TERM GOAL #1  ? Title Pt will be indep to verbalize 2 strategies to manage/minimize carpal tunnel and Dequervain's symptoms in R hand.   ? Baseline Eval: initiated at eval, further training needed   ? Time 2   ? Period Weeks   ? Status New   ? Target Date 07/14/21   ?  ? OT SHORT TERM GOAL #2  ? Title Pt will be indep with donning/doffing/wearing schedule of R hand custom splint.   ? Baseline Eval: splint fabrication planned for 07/06/21   ? Time 2   ? Period Weeks   ? Status New   ? Target Date 07/14/21   ? ?  ?  ? ?  ? ? ? ? OT Long Term Goals - 06/30/21 1620   ? ?  ? OT LONG TERM GOAL #1  ? Title Pt will increase FOTO score to 60 or better to indicate increased functional performance.   ? Baseline Eval: FOTO 53.   ? Time 10   ? Period Weeks   ? Status New   ? Target Date 09/06/21   ?  ? OT LONG TERM GOAL #2  ? Title Pt will tolerate therapeutic modalities to R hand for pain management and report R hand pain at 5 or less with activity.   ? Baseline Eval: pt reports 10+/10 pain with activity in R hand.   ? Time 10   ? Period Weeks   ? Status New   ? Target Date 09/06/21   ?  ? OT LONG TERM GOAL #3  ? Title Pt will increase R grip strength by 10 or more lbs to enable improved ability to hold and carry ADL supplies in R dominant hand.   ? Baseline Eval: R grip 11 (L 43); pt uses L hand to hold/carry supplies   ? Time 10   ? Period Weeks   ? Status New   ? Target Date 09/06/21   ?  ? OT LONG TERM GOAL #4  ? Title Pt will increase R wrist ext by 10 or more degrees for better flexibility during ADL and work tasks.   ? Baseline Eval: R wrist ext 45 degrees, L 58   ? Time 10   ? Period Weeks   ?  Status New   ? Target Date 09/06/21   ?  ? OT LONG  TERM GOAL #5  ? Title Pt will make 2 ergonomic-friendly workspace changes to reduce risk of recurring pain and worsening musculoskeletal injury.   ? Baseline Eval: Ergonomic workspace education initiated at eval, further training needed   ? Time 10   ? Period Weeks   ? Status New   ? Target Date 09/06/21   ? ?  ?  ? ?  ? ? ? Plan - 07/10/21 1404   ? ? Clinical Impression Statement Pt reports good tolerance to new thumb spica and reports wearing it for most of the day and night without issue.  Pt states she doffs splint for exercises but otherwise has been able to keep it on even to type for work.  Pt reported slight discomfort with putty exercises; encouraged pt to pick a few exercises at a time and perform during work breaks, pick a few more for her next work break; avoid doing all at once.  Good tolerance to iontophoresis this day.  Pt reports good compliance with ice; encouraged pt continue several times daily.  Pt will continue to benefit from skilled OT to provide modalities, manage splinting, progress HEP as tolerated in order to decrease hand pain and maximize functional use of R hand for daily tasks.   ? OT Occupational Profile and History Problem Focused Assessment - Including review of records relating to presenting problem   ? Occupational performance deficits (Please refer to evaluation for details): ADL's;IADL's;Leisure;Work   ? Body Structure / Function / Physical Skills ADL;UE functional use;Decreased knowledge of precautions;Sensation;Body mechanics;Flexibility;IADL;Pain;Dexterity;FMC;Strength;Edema;ROM   ? Rehab Potential Good   ? Clinical Decision Making Limited treatment options, no task modification necessary   ? Comorbidities Affecting Occupational Performance: May have comorbidities impacting occupational performance   ? Modification or Assistance to Complete Evaluation  No modification of tasks or assist necessary to complete eval   ? OT Frequency 2x / week   ? OT Duration 4 weeks   ? OT  Treatment/Interventions Self-care/ADL training;Cryotherapy;Paraffin;Therapeutic exercise;DME and/or AE instruction;Ultrasound;Manual Therapy;Splinting;Moist Heat;Iontophoresis;Contrast Bath;Passive range of moti

## 2021-07-11 ENCOUNTER — Encounter: Payer: Managed Care, Other (non HMO) | Admitting: Occupational Therapy

## 2021-07-13 ENCOUNTER — Ambulatory Visit: Payer: Managed Care, Other (non HMO)

## 2021-07-13 ENCOUNTER — Other Ambulatory Visit: Payer: Self-pay

## 2021-07-13 DIAGNOSIS — M25531 Pain in right wrist: Secondary | ICD-10-CM | POA: Diagnosis not present

## 2021-07-13 DIAGNOSIS — M654 Radial styloid tenosynovitis [de Quervain]: Secondary | ICD-10-CM

## 2021-07-13 DIAGNOSIS — M6281 Muscle weakness (generalized): Secondary | ICD-10-CM

## 2021-07-14 ENCOUNTER — Encounter: Payer: Managed Care, Other (non HMO) | Admitting: Occupational Therapy

## 2021-07-14 NOTE — Therapy (Addendum)
[place] [facility] ?[place], [place], [address] ?Phone: [phone]   Fax:  [phone] ? ?Occupational Therapy Treatment ? ?Patient Details  ?Name: Tanya Robinson ?MRN: [id number] ?Date of Birth: 1975-09-03 ?Referring Provider (OT): Talitha Givens, PA ? ? ?Encounter Date: 07/13/2021 ? ? OT End of Session - 07/14/21 1700   ? ? Visit Number 5   ? Number of Visits 10   ? Date for OT Re-Evaluation 09/06/21   ? OT Start Time 1300   ? OT Stop Time 1345   ? OT Time Calculation (min) 45 min   ? Activity Tolerance Patient tolerated treatment well   ? Behavior During Therapy [facility] for tasks assessed/performed   ? ?  ?  ? ?  ? ? ?Past Medical History:  ?Diagnosis Date  ? Anxiety   ? Headache   ? ? ?Past Surgical History:  ?Procedure Laterality Date  ? ABDOMINAL HYSTERECTOMY    ? TUBAL LIGATION  2009  ? ? ?There were no vitals filed for this visit. ? ? Subjective Assessment - 07/13/21 1658   ? ? Subjective  Pt reports doing well today.   ? Pertinent History R carpal tunnel, R DeQuervain's   ? Limitations R wrist and thumb pain, R hand weakness, stiffness in R wrist   ? Special Tests Tinel test negative; held off on Phalen's test as pt reported she performed this at her recent MD appointment with great discomfort, so assume + Phalen's test; Finkelstein's test did not cause discomfort at the APL or EPL into the first extensor compartment of the wrist, but pt does report soreness distal to this compartment.   ? Patient Stated Goals Decrease pain in R hand   ? Currently in Pain? Yes   ? Pain Score 4    ? Pain Location Hand   ? Pain Orientation Right   ? Pain Descriptors / Indicators Aching   ? Pain Type Chronic pain   ? Pain Radiating Towards R volar and dorsal thumb, dorsal aspect of hand at MCPs   ? Pain Onset More than a month ago   ? Aggravating Factors  typing, wrist flex/ext at end range   ? Pain Relieving Factors rest, positioning, increased use of R hand splint   ?  Effect of Pain on Daily Activities Pt using L non-dominant arm for most daily activities; pain with work tasks (typing), pain with IADLs   ? Multiple Pain Sites No   ? ?  ?  ? ?  ? ?Occupational Therapy Treatment: ?Iontophoresis: 8 min  ?2cc dexamethasone patch applied to R thumb CMC; 20 min tx, 40 mA/min, current 2.0 for pain and inflammation.  Pt tolerated well.   ?  ?Manual Therapy: ?Performed cross tissue massage over R thumb, thenar eminence, CMC and wrist flexors/extensors with good tolerance.   ? ?Therapeutic Exercise: ?Performed P/AAROM to R wrist all planes, gentle prolonged Finkelstein's stretch, maintaining pain free ROM for all.  Reviewed HEP, with reinforcement of keeping all exercises pain free, ensuring frequent rests, stretch, maintain low reps and perform only a few exercises at a time. and ice 3-4 x per day x10-20 min per session.     ? ?Response to Treatment: ?See Plan/clinical impression below. ? ? ? OT Education - 07/13/21 1659   ? ? Education Details Splint wearing schedule, HEP review   ? Person(s) Educated Patient;Spouse   ? Methods Explanation;Verbal cues;Handout;Tactile cues;Demonstration   ? Comprehension Verbalized understanding;Verbal cues  required;Need further instruction;Returned demonstration;Tactile cues required   ? ?  ?  ? ?  ? ? ? OT Short Term Goals - 06/30/21 1618   ? ?  ? OT SHORT TERM GOAL #1  ? Title Pt will be indep to verbalize 2 strategies to manage/minimize carpal tunnel and Dequervain's symptoms in R hand.   ? Baseline Eval: initiated at eval, further training needed   ? Time 2   ? Period Weeks   ? Status New   ? Target Date 07/14/21   ?  ? OT SHORT TERM GOAL #2  ? Title Pt will be indep with donning/doffing/wearing schedule of R hand custom splint.   ? Baseline Eval: splint fabrication planned for 07/06/21   ? Time 2   ? Period Weeks   ? Status New   ? Target Date 07/14/21   ? ?  ?  ? ?  ? ? ? ? OT Long Term Goals - 06/30/21 1620   ? ?  ? OT LONG TERM GOAL #1  ? Title Pt  will increase FOTO score to 60 or better to indicate increased functional performance.   ? Baseline Eval: FOTO 53.   ? Time 10   ? Period Weeks   ? Status New   ? Target Date 09/06/21   ?  ? OT LONG TERM GOAL #2  ? Title Pt will tolerate therapeutic modalities to R hand for pain management and report R hand pain at 5 or less with activity.   ? Baseline Eval: pt reports 10+/10 pain with activity in R hand.   ? Time 10   ? Period Weeks   ? Status New   ? Target Date 09/06/21   ?  ? OT LONG TERM GOAL #3  ? Title Pt will increase R grip strength by 10 or more lbs to enable improved ability to hold and carry ADL supplies in R dominant hand.   ? Baseline Eval: R grip 11 (L 43); pt uses L hand to hold/carry supplies   ? Time 10   ? Period Weeks   ? Status New   ? Target Date 09/06/21   ?  ? OT LONG TERM GOAL #4  ? Title Pt will increase R wrist ext by 10 or more degrees for better flexibility during ADL and work tasks.   ? Baseline Eval: R wrist ext 45 degrees, L 58   ? Time 10   ? Period Weeks   ? Status New   ? Target Date 09/06/21   ?  ? OT LONG TERM GOAL #5  ? Title Pt will make 2 ergonomic-friendly workspace changes to reduce risk of recurring pain and worsening musculoskeletal injury.   ? Baseline Eval: Ergonomic workspace education initiated at eval, further training needed   ? Time 10   ? Period Weeks   ? Status New   ? Target Date 09/06/21   ? ?  ?  ? ?  ? ? ? ? Plan - 07/13/21 1916   ? ? Clinical Impression Statement Reinforced need to wear splint during household tasks.  Pt reports icing 2-3 times per day.  OT encouraged at least 3x daily.  Continue to perform all exercises within pain free range, low reps/as tolerated.  Tolerated all therapeutic exercises, manual therapy, and ionto well this day.  Pt will continue to benefit from skilled OT to provide modalities, manage splinting, progress HEP as tolerated in order to decrease hand pain and maximize functional  use of R hand for daily tasks   ? OT Occupational  Profile and History Problem Focused Assessment - Including review of records relating to presenting problem   ? Occupational performance deficits (Please refer to evaluation for details): ADL's;IADL's;Leisure;Work   ? Body Structure / Function / Physical Skills ADL;UE functional use;Decreased knowledge of precautions;Sensation;Body mechanics;Flexibility;IADL;Pain;Dexterity;FMC;Strength;Edema;ROM   ? Rehab Potential Good   ? Clinical Decision Making Limited treatment options, no task modification necessary   ? Comorbidities Affecting Occupational Performance: May have comorbidities impacting occupational performance   ? Modification or Assistance to Complete Evaluation  No modification of tasks or assist necessary to complete eval   ? OT Frequency 2x / week   ? OT Duration 4 weeks   ? OT Treatment/Interventions Self-care/ADL training;Cryotherapy;Paraffin;Therapeutic exercise;DME and/or AE instruction;Ultrasound;Manual Therapy;Splinting;Moist Heat;Iontophoresis;Contrast Bath;Passive range of motion;Therapeutic activities;Patient/family education   ? Consulted and Agree with Plan of Care Patient   ? ?  ?  ? ?  ? ? ?Patient will benefit from skilled therapeutic intervention in order to improve the following deficits and impairments:   ?Body Structure / Function / Physical Skills: ADL, UE functional use, Decreased knowledge of precautions, Sensation, Body mechanics, Flexibility, IADL, Pain, Dexterity, FMC, Strength, Edema, ROM ?  ?  ? ? ?Visit Diagnosis: ?Muscle weakness (generalized) ? ?Pain in right wrist ? ?De Quervain's disease (tenosynovitis) ? ? ? ?Problem List ?Patient Active Problem List  ? Diagnosis Date Noted  ? GERD without esophagitis 05/15/2021  ? Cervical high risk HPV (human papillomavirus) test positive 07/30/2016  ? Bee sting allergy 07/28/2015  ? Dyslipidemia 11/02/2014  ? Migraine without aura and responsive to treatment 11/02/2014  ? Vitamin D deficiency 11/02/2014  ? Urinary incontinence in female  11/02/2014  ? ?Leta Speller, MS, OTR/L ? ?Darleene Cleaver, OT ?07/14/2021, 7:16 PM ? ? ?[facility] ?[place], [place], [address] ?Phone:

## 2021-07-17 ENCOUNTER — Other Ambulatory Visit: Payer: Self-pay

## 2021-07-17 ENCOUNTER — Ambulatory Visit: Payer: Managed Care, Other (non HMO)

## 2021-07-17 DIAGNOSIS — M25531 Pain in right wrist: Secondary | ICD-10-CM | POA: Diagnosis not present

## 2021-07-17 DIAGNOSIS — M654 Radial styloid tenosynovitis [de Quervain]: Secondary | ICD-10-CM

## 2021-07-17 DIAGNOSIS — M6281 Muscle weakness (generalized): Secondary | ICD-10-CM

## 2021-07-18 NOTE — Therapy (Signed)
[facility] ?[place], [place], [address] ?Phone: [phone]   Fax:  [phone] ? ?Occupational Therapy Treatment ? ?Patient Details  ?Name: Tanya Robinson ?MRN: [id number] ?Date of Birth: Feb 04, 1976 ?Referring Provider (OT): Talitha Givens, PA ? ? ?Encounter Date: 07/17/2021 ? ? OT End of Session - 07/18/21 0837   ? ? Visit Number 6   ? Number of Visits 10   ? Date for OT Re-Evaluation 09/06/21   ? OT Start Time 1300   ? OT Stop Time 1345   ? OT Time Calculation (min) 45 min   ? Activity Tolerance Patient tolerated treatment well   ? Behavior During Therapy [facility] for tasks assessed/performed   ? ?  ?  ? ?  ? ? ?Past Medical History:  ?Diagnosis Date  ? Anxiety   ? Headache   ? ? ?Past Surgical History:  ?Procedure Laterality Date  ? ABDOMINAL HYSTERECTOMY    ? TUBAL LIGATION  2009  ? ? ?There were no vitals filed for this visit. ? ? Subjective Assessment - 07/17/21 1321   ? ? Subjective  Pt reported holding her granddaughter this weekend, and granddaughter made a sudden jerk back and pt had to catch her.  Pt reports luckily she was wearing her brace but it still caused some increase in pain.  Pt reports pain is better today, with no pain at rest."   ? Pertinent History R carpal tunnel, R DeQuervain's   ? Limitations R wrist and thumb pain, R hand weakness, stiffness in R wrist   ? Special Tests Tinel test negative; held off on Phalen's test as pt reported she performed this at her recent MD appointment with great discomfort, so assume + Phalen's test; Finkelstein's test did not cause discomfort at the APL or EPL into the first extensor compartment of the wrist, but pt does report soreness distal to this compartment.   ? Patient Stated Goals Decrease pain in R hand   ? Currently in Pain? Yes   0/10 pain at rest, 4/10 pain with activity  ? Pain Score 4    ? Pain Location Hand   ? Pain Orientation Right   ? Pain Descriptors / Indicators Aching   ? Pain Type  Chronic pain   ? Pain Radiating Towards R hand (thumb at first dorsal compartment, MCPs)   ? Pain Onset More than a month ago   ? Pain Frequency Intermittent   ? Aggravating Factors  activity   ? Pain Relieving Factors rest, positioning, use of splint, ice   ? ?  ?  ? ?  ? ?Occupational Therapy Treatment: ?Iontophoresis: 8 min  ?2cc dexamethasone patch applied to R thumb CMC; 20 min tx, 40 mA/min, current 2.0 for pain and inflammation.  Pt tolerated well.   ?  ?Manual Therapy: ?Performed cross tissue massage over R thumb, thenar eminence, CMC and wrist flexors/extensors with good tolerance.  Performed passive R wrist flex/ext, radial/ulnar deviation, and R thumb MP flexion, all within pain free range.  ?  ?Self Care: ?Instructed pt in modified pencil grips.  Worked with "PenAgain" soft grip pen used for reducing tension on thumb when writing vs stabilizing standard pen between IF and MF.  Pt demonstrated most relaxed thumb position with the later, and encouraged pt to practice this grip at home when making notes for work.  Pt verbalized understanding.  ? ?Response to Treatment: ?See Plan/clinical impression below. ? ? OT Education -  07/17/21 0835   ? ? Education Details reinforced benefits of OTC anti-inflammatory daily to pair with other pain management strategies   ? Person(s) Educated Patient   ? Methods Explanation;Verbal cues   ? Comprehension Verbalized understanding;Verbal cues required;Need further instruction   ? ?  ?  ? ?  ? ? ? OT Short Term Goals - 06/30/21 1618   ? ?  ? OT SHORT TERM GOAL #1  ? Title Pt will be indep to verbalize 2 strategies to manage/minimize carpal tunnel and Dequervain's symptoms in R hand.   ? Baseline Eval: initiated at eval, further training needed   ? Time 2   ? Period Weeks   ? Status New   ? Target Date 07/14/21   ?  ? OT SHORT TERM GOAL #2  ? Title Pt will be indep with donning/doffing/wearing schedule of R hand custom splint.   ? Baseline Eval: splint fabrication planned for  07/06/21   ? Time 2   ? Period Weeks   ? Status New   ? Target Date 07/14/21   ? ?  ?  ? ?  ? ? ? ? OT Long Term Goals - 06/30/21 1620   ? ?  ? OT LONG TERM GOAL #1  ? Title Pt will increase FOTO score to 60 or better to indicate increased functional performance.   ? Baseline Eval: FOTO 53.   ? Time 10   ? Period Weeks   ? Status New   ? Target Date 09/06/21   ?  ? OT LONG TERM GOAL #2  ? Title Pt will tolerate therapeutic modalities to R hand for pain management and report R hand pain at 5 or less with activity.   ? Baseline Eval: pt reports 10+/10 pain with activity in R hand.   ? Time 10   ? Period Weeks   ? Status New   ? Target Date 09/06/21   ?  ? OT LONG TERM GOAL #3  ? Title Pt will increase R grip strength by 10 or more lbs to enable improved ability to hold and carry ADL supplies in R dominant hand.   ? Baseline Eval: R grip 11 (L 43); pt uses L hand to hold/carry supplies   ? Time 10   ? Period Weeks   ? Status New   ? Target Date 09/06/21   ?  ? OT LONG TERM GOAL #4  ? Title Pt will increase R wrist ext by 10 or more degrees for better flexibility during ADL and work tasks.   ? Baseline Eval: R wrist ext 45 degrees, L 58   ? Time 10   ? Period Weeks   ? Status New   ? Target Date 09/06/21   ?  ? OT LONG TERM GOAL #5  ? Title Pt will make 2 ergonomic-friendly workspace changes to reduce risk of recurring pain and worsening musculoskeletal injury.   ? Baseline Eval: Ergonomic workspace education initiated at eval, further training needed   ? Time 10   ? Period Weeks   ? Status New   ? Target Date 09/06/21   ? ?  ?  ? ?  ? ? ? ? Plan - 07/17/21 0848   ? ? Clinical Impression Statement Pt continues to tolerate iontophoresis and ice to R hand.  Reinforced benefits of OTC anti-inflammatory daily; pt plans to start this as she has not been consistently taking Tylenol.  Pt continues to be consistent to perform  all exercises at home within pain free ranges.  Pt reports no pain at rest, and 4/10 if trying to  perform an ADL without her brace on.  OT continued to reinforce use of brace for ADL/IADL completion.  Pt verbalized understanding.  Pt will continue to benefit from skilled OT to provide modalities, manage splinting, progress HEP as tolerated in order to decrease hand pain and maximize functional use of R hand for daily tasks   ? OT Occupational Profile and History Problem Focused Assessment - Including review of records relating to presenting problem   ? Occupational performance deficits (Please refer to evaluation for details): ADL's;IADL's;Leisure;Work   ? Body Structure / Function / Physical Skills ADL;UE functional use;Decreased knowledge of precautions;Sensation;Body mechanics;Flexibility;IADL;Pain;Dexterity;FMC;Strength;Edema;ROM   ? Rehab Potential Good   ? Clinical Decision Making Limited treatment options, no task modification necessary   ? Comorbidities Affecting Occupational Performance: May have comorbidities impacting occupational performance   ? Modification or Assistance to Complete Evaluation  No modification of tasks or assist necessary to complete eval   ? OT Frequency 2x / week   ? OT Duration 4 weeks   ? OT Treatment/Interventions Self-care/ADL training;Cryotherapy;Paraffin;Therapeutic exercise;DME and/or AE instruction;Ultrasound;Manual Therapy;Splinting;Moist Heat;Iontophoresis;Contrast Bath;Passive range of motion;Therapeutic activities;Patient/family education   ? Consulted and Agree with Plan of Care Patient   ? ?  ?  ? ?  ? ? ?Patient will benefit from skilled therapeutic intervention in order to improve the following deficits and impairments:   ?Body Structure / Function / Physical Skills: ADL, UE functional use, Decreased knowledge of precautions, Sensation, Body mechanics, Flexibility, IADL, Pain, Dexterity, FMC, Strength, Edema, ROM ?  ?  ? ? ?Visit Diagnosis: ?Muscle weakness (generalized) ? ?De Quervain's disease (tenosynovitis) ? ?Pain in right wrist ? ? ? ?Problem List ?Patient  Active Problem List  ? Diagnosis Date Noted  ? GERD without esophagitis 05/15/2021  ? Cervical high risk HPV (human papillomavirus) test positive 07/30/2016  ? Bee sting allergy 07/28/2015  ? Dyslipidemia 0

## 2021-07-19 ENCOUNTER — Ambulatory Visit: Payer: Managed Care, Other (non HMO)

## 2021-07-24 ENCOUNTER — Ambulatory Visit: Payer: Managed Care, Other (non HMO) | Attending: Physician Assistant

## 2021-07-24 DIAGNOSIS — M654 Radial styloid tenosynovitis [de Quervain]: Secondary | ICD-10-CM | POA: Diagnosis present

## 2021-07-24 DIAGNOSIS — M25531 Pain in right wrist: Secondary | ICD-10-CM | POA: Insufficient documentation

## 2021-07-24 DIAGNOSIS — M6281 Muscle weakness (generalized): Secondary | ICD-10-CM | POA: Diagnosis present

## 2021-07-24 NOTE — Therapy (Signed)
[facility] ?[place], [place], [address] ?Phone: [phone]   Fax:  [phone] ? ?Occupational Therapy Treatment ? ?Patient Details  ?Name: Tanya Robinson ?MRN: [id number] ?Date of Birth: [birth year] ?Referring Provider (OT): Talitha Givens, PA ? ? ?Encounter Date: 07/24/2021 ? ? OT End of Session - 07/24/21 1404   ? ? Visit Number 7   ? Number of Visits 10   ? Date for OT Re-Evaluation 09/06/21   ? OT Start Time 1300   ? OT Stop Time 1345   ? OT Time Calculation (min) 45 min   ? Activity Tolerance Patient tolerated treatment well   ? Behavior During Therapy [facility] for tasks assessed/performed   ? ?  ?  ? ?  ? ? ?Past Medical History:  ?Diagnosis Date  ? Anxiety   ? Headache   ? ? ?Past Surgical History:  ?Procedure Laterality Date  ? ABDOMINAL HYSTERECTOMY    ? TUBAL LIGATION  2009  ? ? ?There were no vitals filed for this visit. ? ? Subjective Assessment - 07/24/21 1359   ? ? Subjective  "I tried that pencil grip, but I stopped.  It just wasn't working for me."   ? Pertinent History R carpal tunnel, R DeQuervain's   ? Limitations R wrist and thumb pain, R hand weakness, stiffness in R wrist   ? Special Tests Tinel test negative; held off on Phalen's test as pt reported she performed this at her recent MD appointment with great discomfort, so assume + Phalen's test; Finkelstein's test did not cause discomfort at the APL or EPL into the first extensor compartment of the wrist, but pt does report soreness distal to this compartment.   ? Patient Stated Goals Decrease pain in R hand   ? Currently in Pain? No/denies   ? Pain Score 0-No pain   ? Pain Onset More than a month ago   ? ?  ?  ? ?  ? ?Occupational Therapy Treatment: ?Iontophoresis: ?2cc dexamethasone patch applied to R thumb CMC; 20 min tx, 40 mA/min, current 2.0 for pain and inflammation.  Pt tolerated well.   ?  ?Manual Therapy: ?Performed cross tissue massage over R thumb, thenar eminence, CMC and  wrist flexors/extensors for increasing circulation to promote tendon healing; with good tolerance.   ? ?Therapeutic Exercise: ?Performed slow, passive R wrist flex/ext, radial/ulnar deviation, and R thumb MP flexion, all within pain free range.  Reviewed joint protection strategies for picking up granddaughter.  Pt states she worked on scooping her up using bigger muscles as educated last week.  Performed AROM for R wrist all planes within pain-free range.  Pt continues to perform theraputty exercises at home within pain-free ranges.  ? ?Response to Treatment: ?See Plan/clinical impression below. ? ? ? OT Education - 07/24/21 1402   ? ? Education Details continue tylenol for pain/inflammation   ? Person(s) Educated Patient   ? Methods Explanation;Verbal cues   ? Comprehension Verbalized understanding;Verbal cues required;Need further instruction   ? ?  ?  ? ?  ? ? ? OT Short Term Goals - 06/30/21 1618   ? ?  ? OT SHORT TERM GOAL #1  ? Title Pt will be indep to verbalize 2 strategies to manage/minimize carpal tunnel and Dequervain's symptoms in R hand.   ? Baseline Eval: initiated at eval, further training needed   ? Time 2   ? Period Weeks   ? Status New   ?  Target Date 07/14/21   ?  ? OT SHORT TERM GOAL #2  ? Title Pt will be indep with donning/doffing/wearing schedule of R hand custom splint.   ? Baseline Eval: splint fabrication planned for 07/06/21   ? Time 2   ? Period Weeks   ? Status New   ? Target Date 07/14/21   ? ?  ?  ? ?  ? ? ? ? OT Long Term Goals - 06/30/21 1620   ? ?  ? OT LONG TERM GOAL #1  ? Title Pt will increase FOTO score to 60 or better to indicate increased functional performance.   ? Baseline Eval: FOTO 53.   ? Time 10   ? Period Weeks   ? Status New   ? Target Date 09/06/21   ?  ? OT LONG TERM GOAL #2  ? Title Pt will tolerate therapeutic modalities to R hand for pain management and report R hand pain at 5 or less with activity.   ? Baseline Eval: pt reports 10+/10 pain with activity in R  hand.   ? Time 10   ? Period Weeks   ? Status New   ? Target Date 09/06/21   ?  ? OT LONG TERM GOAL #3  ? Title Pt will increase R grip strength by 10 or more lbs to enable improved ability to hold and carry ADL supplies in R dominant hand.   ? Baseline Eval: R grip 11 (L 43); pt uses L hand to hold/carry supplies   ? Time 10   ? Period Weeks   ? Status New   ? Target Date 09/06/21   ?  ? OT LONG TERM GOAL #4  ? Title Pt will increase R wrist ext by 10 or more degrees for better flexibility during ADL and work tasks.   ? Baseline Eval: R wrist ext 45 degrees, L 58   ? Time 10   ? Period Weeks   ? Status New   ? Target Date 09/06/21   ?  ? OT LONG TERM GOAL #5  ? Title Pt will make 2 ergonomic-friendly workspace changes to reduce risk of recurring pain and worsening musculoskeletal injury.   ? Baseline Eval: Ergonomic workspace education initiated at eval, further training needed   ? Time 10   ? Period Weeks   ? Status New   ? Target Date 09/06/21   ? ?  ?  ? ?  ? ? ? Plan - 07/24/21 1602   ? ? 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  Reported improved tolerance for picking up her granddaughter when she used joint protection strategies which were reviewed in previous session.  Pt reports trying to practice the ergonomic pencil grip reviewed last session but felt it was too awkward.  Pt does not have to do much writing at work, but OT made recommendation for continuing to focus on relaxing R thumb with pencil grip, and look for wide pen to reduce any stress on thumb.  Pt verbalized understanding of recommendations.  Pt continues to tolerate Iontophoresis well, manual therapy, and therapeutic exercises well.  Pt will continue to benefit from skilled OT to provide modalities, manage splinting, progress HEP as tolerated in order to decrease hand pain and maximize functional use of R hand for daily tasks.   ? OT Occupational Profile and History  Problem Focused Assessment - Including review of records relating  to presenting problem   ? Occupational performance deficits (Please refer to evaluation for details): ADL's;IADL's;Leisure;Work   ? Body Structure / Function / Physical Skills ADL;UE functional use;Decreased knowledge of precautions;Sensation;Body mechanics;Flexibility;IADL;Pain;Dexterity;FMC;Strength;Edema;ROM   ? Rehab Potential Good   ? Clinical Decision Making Limited treatment options, no task modification necessary   ? Comorbidities Affecting Occupational Performance: May have comorbidities impacting occupational performance   ? Modification or Assistance to Complete Evaluation  No modification of tasks or assist necessary to complete eval   ? OT Frequency 2x / week   ? OT Duration 4 weeks   ? OT Treatment/Interventions Self-care/ADL training;Cryotherapy;Paraffin;Therapeutic exercise;DME and/or AE instruction;Ultrasound;Manual Therapy;Splinting;Moist Heat;Iontophoresis;Contrast Bath;Passive range of motion;Therapeutic activities;Patient/family education   ? Consulted and Agree with Plan of Care Patient   ? ?  ?  ? ?  ? ? ?Patient will benefit from skilled therapeutic intervention in order to improve the following deficits and impairments:   ?Body Structure / Function / Physical Skills: ADL, UE functional use, Decreased knowledge of precautions, Sensation, Body mechanics, Flexibility, IADL, Pain, Dexterity, FMC, Strength, Edema, ROM ?  ?  ? ? ?Visit Diagnosis: ?Muscle weakness (generalized) ? ?De Quervain's disease (tenosynovitis) ? ?Pain in right wrist ? ? ? ?Problem List ?Patient Active Problem List  ? Diagnosis Date Noted  ? GERD without esophagitis 05/15/2021  ? Cervical high risk HPV (human papillomavirus) test positive 07/30/2016  ? Bee sting allergy 07/28/2015  ? Dyslipidemia 11/02/2014  ? Migraine without aura and responsive to treatment 11/02/2014  ? Vitamin D deficiency 11/02/2014  ? Urinary incontinence in female 11/02/2014  ? ?Leta Speller, MS, OTR/L ? ?Darleene Cleaver, OT ?07/24/2021, 4:02 PM ? ?[facility] ?[place], [place], [address] ?Phone: [phone]   Fax:  [phone]

## 2021-07-27 ENCOUNTER — Ambulatory Visit: Payer: Managed Care, Other (non HMO)

## 2021-07-27 DIAGNOSIS — M654 Radial styloid tenosynovitis [de Quervain]: Secondary | ICD-10-CM

## 2021-07-27 DIAGNOSIS — M6281 Muscle weakness (generalized): Secondary | ICD-10-CM | POA: Diagnosis not present

## 2021-07-27 DIAGNOSIS — M25531 Pain in right wrist: Secondary | ICD-10-CM

## 2021-07-27 NOTE — Therapy (Signed)
[facility] ?[facility] ?[place], [place], [address] ?Phone: [phone]   Fax:  [phone] ? ?Occupational Therapy Treatment ? ?Patient Details  ?Name: Tanya Robinson ?MRN: [id number] ?Date of Birth: [birth year] ?Referring Provider (OT): Talitha Givens, PA ? ? ?Encounter Date: 07/27/2021 ? ? OT End of Session - 07/27/21 1314   ? ? Visit Number 8   ? Number of Visits 10   ? Date for OT Re-Evaluation 09/06/21   ? OT Start Time 1300   ? OT Stop Time 1345   ? OT Time Calculation (min) 45 min   ? Activity Tolerance Patient tolerated treatment well   ? Behavior During Therapy [facility] for tasks assessed/performed   ? ?  ?  ? ?  ? ? ?Past Medical History:  ?Diagnosis Date  ? Anxiety   ? Headache   ? ? ?Past Surgical History:  ?Procedure Laterality Date  ? ABDOMINAL HYSTERECTOMY    ? TUBAL LIGATION  2009  ? ? ?There were no vitals filed for this visit. ? ? Subjective Assessment - 07/27/21 1310   ? ? Subjective  "It's feeling good."   ? Pertinent History R carpal tunnel, R DeQuervain's   ? Limitations R wrist and thumb pain, R hand weakness, stiffness in R wrist   ? Special Tests Tinel test negative; held off on Phalen's test as pt reported she performed this at her recent MD appointment with great discomfort, so assume + Phalen's test; Finkelstein's test did not cause discomfort at the APL or EPL into the first extensor compartment of the wrist, but pt does report soreness distal to this compartment.   ? Patient Stated Goals Decrease pain in R hand   ? Currently in Pain? Yes   ? Pain Score 4    with activity only; no pain at rest  ? Pain Location Hand   ? Pain Orientation Right   ? Pain Descriptors / Indicators Aching   ? Pain Type Chronic pain   ? Pain Radiating Towards base of R thumb   ? Pain Onset More than a month ago   ? Pain Frequency Intermittent   ? Aggravating Factors  activity without brace   ? Pain Relieving Factors rest, positioning, use of brace, ice, tylenol   ?  Effect of Pain on Daily Activities Pt using L non-dominant arm for most daily activities, or can use R hand for light activity wearing brace   ? Multiple Pain Sites No   ? ?  ?  ? ?  ? ?Occupational Therapy Treatment: ?Iontophoresis: ?2cc dexamethasone patch applied to R thumb CMC; 20 min tx, 40 mA/min, current 1.5 (turned down from 2.0 d/t burning sensation) for pain and inflammation.  Pt tolerated well at 1.5.   ?  ?Manual Therapy: ?Performed cross tissue massage over R thumb, thenar eminence, CMC and wrist flexors/extensors for increasing circulation to promote tendon healing; with good tolerance.   ?  ?Therapeutic Exercise: ?Performed slow, passive R wrist flex/ext, radial/ulnar deviation, and R thumb MP flexion, all within pain free range.  Performed AROM for R wrist all planes within pain-free range.  Performed tendon glides x5 with good tolerance.  Pt continues to perform theraputty exercises at home within pain-free ranges.  ?  ?Response to Treatment: ?See Plan/clinical impression below. ? ? ? OT Education - 07/27/21 1314   ? ? Education Details continue tylenol for pain/inflammation   ? Person(s) Educated Patient   ? Methods Explanation;Verbal  cues   ? Comprehension Verbalized understanding;Verbal cues required;Need further instruction   ? ?  ?  ? ?  ? ? ? OT Short Term Goals - 06/30/21 1618   ? ?  ? OT SHORT TERM GOAL #1  ? Title Pt will be indep to verbalize 2 strategies to manage/minimize carpal tunnel and Dequervain's symptoms in R hand.   ? Baseline Eval: initiated at eval, further training needed   ? Time 2   ? Period Weeks   ? Status New   ? Target Date 07/14/21   ?  ? OT SHORT TERM GOAL #2  ? Title Pt will be indep with donning/doffing/wearing schedule of R hand custom splint.   ? Baseline Eval: splint fabrication planned for 07/06/21   ? Time 2   ? Period Weeks   ? Status New   ? Target Date 07/14/21   ? ?  ?  ? ?  ? ? ? ? OT Long Term Goals - 06/30/21 1620   ? ?  ? OT LONG TERM GOAL #1  ? Title Pt  will increase FOTO score to 60 or better to indicate increased functional performance.   ? Baseline Eval: FOTO 53.   ? Time 10   ? Period Weeks   ? Status New   ? Target Date 09/06/21   ?  ? OT LONG TERM GOAL #2  ? Title Pt will tolerate therapeutic modalities to R hand for pain management and report R hand pain at 5 or less with activity.   ? Baseline Eval: pt reports 10+/10 pain with activity in R hand.   ? Time 10   ? Period Weeks   ? Status New   ? Target Date 09/06/21   ?  ? OT LONG TERM GOAL #3  ? Title Pt will increase R grip strength by 10 or more lbs to enable improved ability to hold and carry ADL supplies in R dominant hand.   ? Baseline Eval: R grip 11 (L 43); pt uses L hand to hold/carry supplies   ? Time 10   ? Period Weeks   ? Status New   ? Target Date 09/06/21   ?  ? OT LONG TERM GOAL #4  ? Title Pt will increase R wrist ext by 10 or more degrees for better flexibility during ADL and work tasks.   ? Baseline Eval: R wrist ext 45 degrees, L 58   ? Time 10   ? Period Weeks   ? Status New   ? Target Date 09/06/21   ?  ? OT LONG TERM GOAL #5  ? Title Pt will make 2 ergonomic-friendly workspace changes to reduce risk of recurring pain and worsening musculoskeletal injury.   ? Baseline Eval: Ergonomic workspace education initiated at eval, further training needed   ? Time 10   ? Period Weeks   ? Status New   ? Target Date 09/06/21   ? ?  ?  ? ?  ? ? Plan - 07/27/21 1350   ? ? Clinical Impression Statement Pt consistently with no pain at rest, 4/10 pain with activity.  Pt continues to perform theraputty exercises within pain free ranges at home with good tolerance.  Pt continues to tolerate Iontophoresis well, manual therapy, and therapeutic exercises well.  Pt will continue to benefit from skilled OT to provide modalities, manage splinting, progress HEP as tolerated in order to decrease hand pain and maximize functional use of R hand for  daily tasks.   ? OT Occupational Profile and History Problem Focused  Assessment - Including review of records relating to presenting problem   ? Occupational performance deficits (Please refer to evaluation for details): ADL's;IADL's;Leisure;Work   ? Body Structure / Function / Physical Skills ADL;UE functional use;Decreased knowledge of precautions;Sensation;Body mechanics;Flexibility;IADL;Pain;Dexterity;FMC;Strength;Edema;ROM   ? Rehab Potential Good   ? Clinical Decision Making Limited treatment options, no task modification necessary   ? Comorbidities Affecting Occupational Performance: May have comorbidities impacting occupational performance   ? Modification or Assistance to Complete Evaluation  No modification of tasks or assist necessary to complete eval   ? OT Frequency 2x / week   ? OT Duration 4 weeks   ? OT Treatment/Interventions Self-care/ADL training;Cryotherapy;Paraffin;Therapeutic exercise;DME and/or AE instruction;Ultrasound;Manual Therapy;Splinting;Moist Heat;Iontophoresis;Contrast Bath;Passive range of motion;Therapeutic activities;Patient/family education   ? Consulted and Agree with Plan of Care Patient   ? ?  ?  ? ?  ? ? ?Patient will benefit from skilled therapeutic intervention in order to improve the following deficits and impairments:   ?Body Structure / Function / Physical Skills: ADL, UE functional use, Decreased knowledge of precautions, Sensation, Body mechanics, Flexibility, IADL, Pain, Dexterity, FMC, Strength, Edema, ROM ?  ?  ? ? ?Visit Diagnosis: ?Muscle weakness (generalized) ? ?De Quervain's disease (tenosynovitis) ? ?Pain in right wrist ? ? ? ?Problem List ?Patient Active Problem List  ? Diagnosis Date Noted  ? GERD without esophagitis 05/15/2021  ? Cervical high risk HPV (human papillomavirus) test positive 07/30/2016  ? Bee sting allergy 07/28/2015  ? Dyslipidemia 11/02/2014  ? Migraine without aura and responsive to treatment 11/02/2014  ? Vitamin D deficiency 11/02/2014  ? Urinary incontinence in female 11/02/2014  ? ?Leta Speller, MS,  OTR/L ? ?Darleene Cleaver, OT ?07/28/2021, 1:51 PM ? ?[facility] [place] ?[facility] ?[place], [place], [address] ?Phone: [phone]   Fax:  [phone]

## 2021-07-31 ENCOUNTER — Ambulatory Visit: Payer: Managed Care, Other (non HMO) | Admitting: Occupational Therapy

## 2021-07-31 ENCOUNTER — Encounter: Payer: Self-pay | Admitting: Occupational Therapy

## 2021-07-31 DIAGNOSIS — M654 Radial styloid tenosynovitis [de Quervain]: Secondary | ICD-10-CM

## 2021-07-31 DIAGNOSIS — M6281 Muscle weakness (generalized): Secondary | ICD-10-CM

## 2021-07-31 DIAGNOSIS — M25531 Pain in right wrist: Secondary | ICD-10-CM

## 2021-08-01 NOTE — Therapy (Signed)
[place] [facility] ?[place], [place], [address] ?Phone: [phone]   Fax:  [phone] ? ?Occupational Therapy Treatment ? ?Patient Details  ?Name: Tanya Robinson ?MRN: [id number] ?Date of Birth: [birth year] ?Referring Provider (OT): Talitha Givens, PA ? ? ?Encounter Date: 07/31/2021 ? ? OT End of Session - 07/31/21 1128   ? ? Visit Number 9   ? Number of Visits 10   ? Date for OT Re-Evaluation 09/06/21   ? OT Start Time 1115   ? OT Stop Time 1210   ? OT Time Calculation (min) 55 min   ? Activity Tolerance Patient tolerated treatment well   ? Behavior During Therapy [facility] for tasks assessed/performed   ? ?  ?  ? ?  ? ? ?Past Medical History:  ?Diagnosis Date  ? Anxiety   ? Headache   ? ? ?Past Surgical History:  ?Procedure Laterality Date  ? ABDOMINAL HYSTERECTOMY    ? TUBAL LIGATION  2009  ? ? ?There were no vitals filed for this visit. ? ? Subjective Assessment - 07/31/21 1125   ? ? Subjective  Pt reports 2/10 pain in right hand, positive for finkestein's.  Reports the last time ionto was a bit uncomfortable.   ? Pertinent History R carpal tunnel, R DeQuervain's   ? Limitations R wrist and thumb pain, R hand weakness, stiffness in R wrist   ? Special Tests Tinel test negative; held off on Phalen's test as pt reported she performed this at her recent MD appointment with great discomfort, so assume + Phalen's test; Finkelstein's test did not cause discomfort at the APL or EPL into the first extensor compartment of the wrist, but pt does report soreness distal to this compartment.   ? Patient Stated Goals Decrease pain in R hand   ? Currently in Pain? Yes   ? Pain Score 2    ? Pain Location Hand   ? Pain Orientation Right   ? Pain Descriptors / Indicators Aching   ? Pain Type Chronic pain   ? Pain Onset More than a month ago   ? Pain Frequency Intermittent   ? ?  ?  ? ?  ? ? ?Occupational Therapy Treatment: ? ?Contrast: ?Pt seen for use of contrast to R hand,  wrist and digits for 11 mins to decrease pain, inflammation and for edema control.   ?Pt instructed on of contrast for home program.   ? ?Iontophoresis: ?2cc dexamethasone patch applied to R thumb CMC; 20 min tx, 40 mA/min, current 2.0 for pain and inflammation.   ?  ?Manual Therapy: ?Performed cross tissue massage over R thumb, thenar eminence, CMC and wrist flexors/extensors for increasing circulation to promote tendon healing; with good tolerance.  Use of soft tissue massage tool to forearm, dorsal and volar surface to decrease any facial adhesions or soft tissue lesions to impact ROM and pain.   ?  ?Therapeutic Exercise: ?Performed slow, passive R wrist flex/ext, radial/ulnar deviation, and R thumb MP flexion, all within pain free range.  Performed AROM for R wrist all planes within pain-free range.  Performed tendon glides x 10 reps with good tolerance.   ?  ?Response to Treatment: ?Pt progressing well with therapy, decreased pain overall but positive for finkelstein's this date.  Pt instructed on use of contrast for edema control and to decrease inflammation.  She demonstrates understanding of ROM and exercises for home program.  Pt also demonstrates understanding of activity modification  at work and home.  Discussed the amount of typing she does for work and how a Psychologist, sport and exercise program could be beneficial to her. She is planning to check with her supervisor at work and see if they have access to this type of software in their organization.  Continue to work towards goals in plan of care to maximize safety and independence in necessary daily tasks.   ? ? ? ? ? ? ? ? ? ? ? ? ? ? ? ? ? ? ? OT Education - 07/31/21 1127   ? ? Education Details use of contrast, ROM, use of larger grip on surfaces   ? Person(s) Educated Patient   ? Methods Explanation;Verbal cues   ? Comprehension Verbalized understanding;Verbal cues required;Need further instruction   ? ?  ?  ? ?  ? ? ? OT Short Term Goals -  06/30/21 1618   ? ?  ? OT SHORT TERM GOAL #1  ? Title Pt will be indep to verbalize 2 strategies to manage/minimize carpal tunnel and Dequervain's symptoms in R hand.   ? Baseline Eval: initiated at eval, further training needed   ? Time 2   ? Period Weeks   ? Status New   ? Target Date 07/14/21   ?  ? OT SHORT TERM GOAL #2  ? Title Pt will be indep with donning/doffing/wearing schedule of R hand custom splint.   ? Baseline Eval: splint fabrication planned for 07/06/21   ? Time 2   ? Period Weeks   ? Status New   ? Target Date 07/14/21   ? ?  ?  ? ?  ? ? ? ? OT Long Term Goals - 06/30/21 1620   ? ?  ? OT LONG TERM GOAL #1  ? Title Pt will increase FOTO score to 60 or better to indicate increased functional performance.   ? Baseline Eval: FOTO 53.   ? Time 10   ? Period Weeks   ? Status New   ? Target Date 09/06/21   ?  ? OT LONG TERM GOAL #2  ? Title Pt will tolerate therapeutic modalities to R hand for pain management and report R hand pain at 5 or less with activity.   ? Baseline Eval: pt reports 10+/10 pain with activity in R hand.   ? Time 10   ? Period Weeks   ? Status New   ? Target Date 09/06/21   ?  ? OT LONG TERM GOAL #3  ? Title Pt will increase R grip strength by 10 or more lbs to enable improved ability to hold and carry ADL supplies in R dominant hand.   ? Baseline Eval: R grip 11 (L 43); pt uses L hand to hold/carry supplies   ? Time 10   ? Period Weeks   ? Status New   ? Target Date 09/06/21   ?  ? OT LONG TERM GOAL #4  ? Title Pt will increase R wrist ext by 10 or more degrees for better flexibility during ADL and work tasks.   ? Baseline Eval: R wrist ext 45 degrees, L 58   ? Time 10   ? Period Weeks   ? Status New   ? Target Date 09/06/21   ?  ? OT LONG TERM GOAL #5  ? Title Pt will make 2 ergonomic-friendly workspace changes to reduce risk of recurring pain and worsening musculoskeletal injury.   ? Baseline Eval: Ergonomic workspace  education initiated at eval, further training needed   ? Time 10    ? Period Weeks   ? Status New   ? Target Date 09/06/21   ? ?  ?  ? ?  ? ? ? ? ? ? ? ? Plan - 07/31/21 1129   ? ? Clinical Impression Statement Pt progressing well with therapy, decreased pain overall but positive for finkelstein's this date.  Pt instructed on use of contrast for edema control and to decrease inflammation.  She demonstrates understanding of ROM and exercises for home program.  Pt also demonstrates understanding of activity modification at work and home.  Discussed the amount of typing she does for work and how a Psychologist, sport and exercise program could be beneficial to her. She is planning to check with her supervisor at work and see if they have access to this type of software in their organization.  Continue to work towards goals in plan of care to maximize safety and independence in necessary daily tasks.   ? OT Occupational Profile and History Problem Focused Assessment - Including review of records relating to presenting problem   ? Occupational performance deficits (Please refer to evaluation for details): ADL's;IADL's;Leisure;Work   ? Body Structure / Function / Physical Skills ADL;UE functional use;Decreased knowledge of precautions;Sensation;Body mechanics;Flexibility;IADL;Pain;Dexterity;FMC;Strength;Edema;ROM   ? Rehab Potential Good   ? Clinical Decision Making Limited treatment options, no task modification necessary   ? Comorbidities Affecting Occupational Performance: May have comorbidities impacting occupational performance   ? Modification or Assistance to Complete Evaluation  No modification of tasks or assist necessary to complete eval   ? OT Frequency 2x / week   ? OT Duration 4 weeks   ? OT Treatment/Interventions Self-care/ADL training;Cryotherapy;Paraffin;Therapeutic exercise;DME and/or AE instruction;Ultrasound;Manual Therapy;Splinting;Moist Heat;Iontophoresis;Contrast Bath;Passive range of motion;Therapeutic activities;Patient/family education   ? Consulted and  Agree with Plan of Care Patient   ? ?  ?  ? ?  ? ? ?Patient will benefit from skilled therapeutic intervention in order to improve the following deficits and impairments:   ?Body Structure / Function / Physic

## 2021-08-02 ENCOUNTER — Ambulatory Visit: Payer: Managed Care, Other (non HMO)

## 2021-08-07 ENCOUNTER — Ambulatory Visit: Payer: Managed Care, Other (non HMO)

## 2021-08-09 ENCOUNTER — Ambulatory Visit: Payer: Managed Care, Other (non HMO)

## 2021-08-14 ENCOUNTER — Ambulatory Visit: Payer: Managed Care, Other (non HMO)

## 2021-08-14 DIAGNOSIS — M6281 Muscle weakness (generalized): Secondary | ICD-10-CM | POA: Diagnosis not present

## 2021-08-14 DIAGNOSIS — M654 Radial styloid tenosynovitis [de Quervain]: Secondary | ICD-10-CM

## 2021-08-14 DIAGNOSIS — M25531 Pain in right wrist: Secondary | ICD-10-CM

## 2021-08-15 NOTE — Therapy (Signed)
[place] [facility] ?[place], [place], [address] ?Phone: [phone]   Fax:  [phone] ? ?Occupational Therapy Discharge ? ?Patient Details  ?Name: Tanya Robinson ?MRN: [id number] ?Date of Birth: 1975/04/29 ?Referring Provider (OT): Tanya Givens, PA ? ? ?Encounter Date: 08/14/2021 ? ? OT End of Session - 08/15/21 0839   ? ? Visit Number 10   ? Number of Visits 10   ? Date for OT Re-Evaluation 09/06/21   ? OT Start Time 1400   ? OT Stop Time 3149   ? OT Time Calculation (min) 45 min   ? Activity Tolerance Patient tolerated treatment well   ? Behavior During Therapy [facility] for tasks assessed/performed   ? ?  ?  ? ?  ? ? ?Past Medical History:  ?Diagnosis Date  ? Anxiety   ? Headache   ? ? ?Past Surgical History:  ?Procedure Laterality Date  ? ABDOMINAL HYSTERECTOMY    ? TUBAL LIGATION  2009  ? ? ?There were no vitals filed for this visit. ? ? Subjective Assessment - 08/14/21 0826   ? ? Subjective  Pt reports having to miss therapy last week d/t being out of state with her father.   ? Pertinent History R carpal tunnel, R DeQuervain's   ? Limitations R wrist and thumb pain, R hand weakness, stiffness in R wrist   ? Special Tests Tinel test negative; held off on Phalen's test as pt reported she performed this at her recent MD appointment with great discomfort, so assume + Phalen's test; Finkelstein's test did not cause discomfort at the APL or EPL into the first extensor compartment of the wrist, but pt does report soreness distal to this compartment.   ? Patient Stated Goals Decrease pain in R hand   ? Currently in Pain? Yes   ? Pain Score 2    2/10 with activity, 0/10 at rest  ? Pain Location Hand   ? Pain Orientation Right   ? Pain Descriptors / Indicators Aching   ? Pain Type Chronic pain   ? Pain Radiating Towards base of R thumb   ? Pain Onset More than a month ago   ? Pain Frequency Intermittent   ? Aggravating Factors  repetive activity, heavy lifting or  gripping   ? Pain Relieving Factors rest, positioning, use of brace, ice, tylenol   ? Effect of Pain on Daily Activities Pt using L non-dominant arm to rest RUE from heavy lifting and repetitive activity   ? Multiple Pain Sites No   ? ?  ?  ? ?  ? ?Occupational Therapy Discharge: ?Iontophoresis: ?2cc dexamethasone patch applied to R thumb CMC; 35 min tx, 40 mA/min, current adjusted between 1.0-1.5 (turned down from 2.0 d/t burning sensation) for pain and inflammation.  Pt tolerated well at reduced current setting.   ? ?Therapeutic Activity: ?Objective measures taken and goals updated.  Reviewed HEP, activity modification, joint protection, rest, ice, alternating between R/L hands to avoid overuse of 1 hand, continuing with frequent rest and stretch breaks at work and home to limit repetition with hands.  Made recommendation to obtain carpal tunnel wrist brace as pt states she still wakes up with numbness in R hand, despite wearing thumb spica to bed which does limit wrist flex/ext slightly, but carpal tunnel brace likely to maintain neutral wrist better than spica.  Pt receptive and plans to do so.  Issued instructions for contrast bath and reviewed benefits of  this for increasing circulation to R wrist/thumb to help with inflammation and tendon healing.  Pt verbalized understanding.   ? ?Response to Treatment: ?Pt has been seen for 10 sessions to address pain and weakness from R De Quervain's.  Pt has received 7 Iontophoresis treatments with Dexamethasone, has been consistently wearing custom thumb spica, is indep with HEP, joint protection strategies, and is indep with activity modification for work and ADL tasks.  Pt presents with increase in bilat grip strength from 11# to 45# on the R dominant (affected side), and increase in L grip from 43# to 59#.  Pt presents with negative Finkelstein's on the R.  Made recommendation to obtain carpal tunnel wrist brace as pt states she still wakes up with numbness in R hand,  despite wearing thumb spica to bed which does limit wrist flex/ext slightly, but carpal tunnel brace likely to maintain neutral wrist better than spica.  Pt receptive and plans to do so.  Pt reports 0/10 pain at rest and with activity at R thumb [facility] when she follows good joint protection strategies, 2/10 pain with activity when body mechanics are compromised slightly.  Pt appropriate for discharge at this time with goals met and is indep with strategies noted above for long term pain management and compensatory strategies to prevent flare ups of Tanya Robinson.  ? ? ? OT Education - 08/14/21 0833   ? ? Education Details discharge instructions (joint protection, activity modification, HEP)   ? Person(s) Educated Patient   ? Methods Explanation;Verbal cues   ? Comprehension Verbalized understanding;Returned demonstration   ? ?  ?  ? ?  ? ? ? OT Short Term Goals - 08/14/21 0959   ? ?  ? OT SHORT TERM GOAL #1  ? Title Pt will be indep to verbalize 2 strategies to manage/minimize carpal tunnel and Dequervain's symptoms in R hand.   ? Baseline Eval: initiated at eval, further training needed; 08/14/21 d/c: indep with strategies to manage/minimize carpal tunnel and De Quervains's symptoms in R hand.   ? Time 2   ? Period Weeks   ? Status Achieved   ? Target Date 07/14/21   ?  ? OT SHORT TERM GOAL #2  ? Title Pt will be indep with donning/doffing/wearing schedule of R hand custom splint.   ? Baseline Eval: splint fabrication planned for 07/06/21; 08/14/21 d/c: indep and consistent with splinting   ? Time 2   ? Period Weeks   ? Status Achieved   ? Target Date 07/14/21   ? ?  ?  ? ?  ? ? ? ? OT Long Term Goals - 08/14/21 1001   ? ?  ? OT LONG TERM GOAL #1  ? Title Pt will increase FOTO score to 60 or better to indicate increased functional performance.   ? Baseline Eval: FOTO 53; 08/14/21 d/c: FOTO 67   ? Time 10   ? Period Weeks   ? Status Achieved   ? Target Date 09/06/21   ?  ? OT LONG TERM GOAL #2  ? Title Pt will tolerate  therapeutic modalities to R hand for pain management and report R hand pain at 5 or less with activity.   ? Baseline Eval: pt reports 10+/10 pain with activity in R hand; 08/14/21 d/c: Pt reports 0-2/10 pain in R thumb CMC with activity   ? Time 10   ? Period Weeks   ? Status Achieved   ? Target Date 09/06/21   ?  ?  OT LONG TERM GOAL #3  ? Title Pt will increase R grip strength by 10 or more lbs to enable improved ability to hold and carry ADL supplies in R dominant hand.   ? Baseline Eval: R grip 11 (L 43); pt uses L hand to hold/carry supplies; 08/14/21 d/c: R grip 45   ? Time 10   ? Period Weeks   ? Status Achieved   ? Target Date 09/06/21   ?  ? OT LONG TERM GOAL #4  ? Title Pt will increase R wrist ext by 10 or more degrees for better flexibility during ADL and work tasks.   ? Baseline Eval: R wrist ext 45 degrees, L 58; 08/14/21 d/c: R wrist ext 55   ? Time 10   ? Period Weeks   ? Status Achieved   ? Target Date 09/06/21   ?  ? OT LONG TERM GOAL #5  ? Title Pt will make 2 ergonomic-friendly workspace changes to reduce risk of recurring pain and worsening musculoskeletal injury.   ? Baseline Eval: Ergonomic workspace education initiated at eval, further training needed; 08/14/21 d/c: Pt has adjusted chair, keyboard, and screen positioning based on ergonomic workspace recommendations for more neutral positioning for neck, elbows, and wrist.   ? Time 10   ? Period Weeks   ? Status Achieved   ? Target Date 09/06/21   ? ?  ?  ? ?  ? ? ? Plan - 08/14/21 0959   ? ? Clinical Impression Statement Pt has been seen for 10 sessions to address pain and weakness from R De Quervain's.  FOTO score has improved from 53 to 67.  Pt has received 7 Iontophoresis treatments with Dexamethasone, has been consistently wearing custom thumb spica, is indep with HEP, joint protection strategies, and is indep with activity modification for work and ADL tasks.  Pt presents with increase in bilat grip strength from 11# to 45# on the R dominant  (affected side), and increase in L grip from 43# to 59#.  Pt presents with negative Finkelstein's on the R.  Made recommendation to obtain carpal tunnel wrist brace as pt states she still wakes up with num

## 2021-08-16 ENCOUNTER — Ambulatory Visit: Payer: Managed Care, Other (non HMO)

## 2021-08-25 NOTE — Progress Notes (Signed)
Name: Tanya Robinson   MRN: [id number]    DOB: 1975-09-19   Date:08/28/2021 ? ?     Progress Note ? ?Subjective ? ?Chief Complaint ? ?Follow up  ? ?HPI ? ?Obesity: she states her high school weight was 140 lbs. When she delivered her son she was 176 lbs in 1999 and maintaining the weight around 170 lbs for many years. Over the past few years weight has been trending up, staying in the 180 lbs and now 194 lbs. She is is avoiding junk food and sodas. She states she has been eating larger portions during meals and unable to stop. Discussed options and we will try [facility]. She denies family history of thyroid cancer or personal history of pancreatitis. She has dyslipidemia and pre-diabetes ? ?Hyperlipidemia: mother takes cholesterol medication, father has diabetes, her LDL last year was 71, discussed familial dyslipidemia. She does not want to start medications at this time, eating healthier  ? ?The 10-year ASCVD risk score (Arnett DK, et al., 2019) is: 1.2% ?  Values used to calculate the score: ?    Age: 45 years ?    Sex: Female ?    Is Non-Hispanic African American: Yes ?    Diabetic: No ?    Tobacco smoker: No ?    Systolic Blood Pressure: 160 mmHg ?    Is BP treated: No ?    HDL Cholesterol: 64 mg/dL ?    Total Cholesterol: 263 mg/dL  ? ?Vitamin D : she is taking otc vitamin D .  ? ?Left  breast mass: up to date with [place] and mammogram will repeat in June 2023. We will schedule it today  ? ?GERD: she went to [facility] 01/09 with severe burning sensation on esophagus and dysphagia. She was given GI cocktail and sent home on PPI. Symptoms are controlled with medications.  ? ?Carpal tunnel andr right wrist carpometacarpal OA: she was seen by PT and continues to have pain we will refer her back to Ortho  ? ?Patient Active Problem List  ? Diagnosis Date Noted  ? GERD without esophagitis 05/15/2021  ? Cervical high risk HPV (human papillomavirus) test positive 07/30/2016  ? Bee sting allergy 07/28/2015  ? Dyslipidemia 11/02/2014  ?  Migraine without aura and responsive to treatment 11/02/2014  ? Vitamin D deficiency 11/02/2014  ? Urinary incontinence in female 11/02/2014  ? ? ?Past Surgical History:  ?Procedure Laterality Date  ? ABDOMINAL HYSTERECTOMY    ? TUBAL LIGATION  2009  ? ? ?Family History  ?Problem Relation Age of Onset  ? Hypertension Mother   ? Hyperlipidemia Mother   ? Diabetes Father   ? ? ?Social History  ? ?Tobacco Use  ? Smoking status: Never  ? Smokeless tobacco: Never  ?Substance Use Topics  ? Alcohol use: Not Currently  ?  Comment: occasionally  ? ? ? ?Current Outpatient Medications:  ?  Cholecalciferol (VITAMIN D) 2000 units CAPS, Take 1 capsule (2,000 Units total) by mouth daily., Disp: 30 capsule, Rfl: 0 ?  EPINEPHrine 0.3 mg/0.3 mL IJ SOAJ injection, , Disp: , Rfl:  ?  Vitamin D, Ergocalciferol, (DRISDOL) 1.25 MG (50000 UNIT) CAPS capsule, Take 1 capsule (50,000 Units total) by mouth every 7 (seven) days., Disp: 12 capsule, Rfl: 0 ?  pantoprazole (PROTONIX) 20 MG tablet, Take 1 tablet (20 mg total) by mouth daily., Disp: 90 tablet, Rfl: 0 ? ?Allergies  ?Allergen Reactions  ? Tea Hives  ? Bee Venom   ?  Bee  ? ? ?  I personally reviewed active problem list, medication list, allergies, family history, social history, health maintenance with the patient/caregiver today. ? ? ?ROS ? ?.Constitutional: Negative for fever or weight change.  ?Respiratory: Negative for cough and shortness of breath.   ?Cardiovascular: Negative for chest pain or palpitations.  ?Gastrointestinal: Negative for abdominal pain, no bowel changes.  ?Musculoskeletal: Negative for gait problem or joint swelling.  ?Skin: Negative for rash.  ?Neurological: Negative for dizziness or headache.  ?No other specific complaints in a complete review of systems (except as listed in HPI above).  ? ?Objective ? ?Vitals:  ? 08/28/21 1421  ?BP: 120/74  ?Pulse: (!) 107  ?Resp: 16  ?SpO2: 98%  ?Weight: 194 lb (88 kg)  ?Height: 5' 5"  (1.651 m)  ? ? ?Body mass index is 32.28  kg/m?. ? ?Physical Exam ? ?Constitutional: Patient appears well-developed and well-nourished. Obese  No distress.  ?HEENT: head atraumatic, normocephalic, pupils equal and reactive to light, neck supple ?Cardiovascular: Normal rate, regular rhythm and normal heart sounds.  No murmur heard. No BLE edema. ?Pulmonary/Chest: Effort normal and breath sounds normal. No respiratory distress. ?Abdominal: Soft.  There is no tenderness. ?Psychiatric: Patient has a normal mood and affect. behavior is normal. Judgment and thought content normal.  ? ? ?PHQ2/9: ? ?  08/28/2021  ?  2:22 PM 06/28/2021  ? 11:40 AM 05/15/2021  ?  3:28 PM 02/20/2021  ?  8:23 AM 09/14/2020  ?  1:40 PM  ?Depression screen PHQ 2/9  ?Decreased Interest 0 0 0 0 0  ?Down, Depressed, Hopeless 0 0 0 0 0  ?PHQ - 2 Score 0 0 0 0 0  ?Altered sleeping  0 0  0  ?Tired, decreased energy  0 0  0  ?Change in appetite  0 0  0  ?Feeling bad or failure about yourself   0 0  0  ?Trouble concentrating  0 0  0  ?Moving slowly or fidgety/restless  0 0  0  ?Suicidal thoughts  0 0  0  ?PHQ-9 Score  0 0  0  ?Difficult doing work/chores  Not difficult at all     ?  ?phq 9 is negative ? ? ?Fall Risk: ? ?  08/28/2021  ?  2:22 PM 06/28/2021  ? 11:40 AM 05/15/2021  ?  3:28 PM 02/20/2021  ?  8:23 AM 09/14/2020  ?  1:40 PM  ?Fall Risk   ?Falls in the past year? 0 0 0 1 0  ?Number falls in past yr: 0 0 0 0 0  ?Injury with Fall? 0 0 0 1 0  ?Risk for fall due to : No Fall Risks No Fall Risks No Fall Risks History of fall(s)   ?Follow up Falls prevention discussed Falls prevention discussed Falls prevention discussed Falls prevention discussed Falls prevention discussed  ? ? ? ? ?Functional Status Survey: ?Is the patient deaf or have difficulty hearing?: No ?Does the patient have difficulty seeing, even when wearing glasses/contacts?: No ?Does the patient have difficulty concentrating, remembering, or making decisions?: No ?Does the patient have difficulty walking or climbing stairs?: No ?Does the  patient have difficulty dressing or bathing?: No ?Does the patient have difficulty doing errands alone such as visiting a doctor's office or shopping?: No ? ? ? ?Assessment & Plan ? ?Problem List Items Addressed This Visit   ? ? Dyslipidemia  ?  On diet only  ? ?  ?  ? Vitamin D deficiency  ?  Continue supplementation  ? ?  ?  ?  GERD without esophagitis  ?  Controlled  ? ?  ?  ? Relevant Medications  ? pantoprazole (PROTONIX) 20 MG tablet  ? Obesity (BMI 30.0-34.9)  ?  Starting [facility] weight of 194 lbs  ? ?  ?  ? Relevant Medications  ? Semaglutide-Weight Management (WEGOVY) 0.25 MG/0.5ML SOAJ  ? Semaglutide-Weight Management (WEGOVY) 0.5 MG/0.5ML SOAJ  ? Primary osteoarthritis of first carpometacarpal joint of right hand - Primary  ? Relevant Orders  ? Ambulatory referral to Orthopedic Surgery  ? Carpal tunnel syndrome of right wrist  ?  Refer back to Dr. Rudene Christians  ? ?  ?  ? Relevant Medications  ? Semaglutide-Weight Management (WEGOVY) 0.25 MG/0.5ML SOAJ  ? Semaglutide-Weight Management (WEGOVY) 0.5 MG/0.5ML SOAJ  ? Other Relevant Orders  ? Ambulatory referral to Orthopedic Surgery  ? ?Other Visit Diagnoses   ? ? Colon cancer screening      ? Relevant Orders  ? Cologuard  ? Abnormal mammogram      ? Relevant Orders  ? MM Digital Diagnostic Bilat  ? US BREAST LTD UNI LEFT INC AXILLA  ? ?  ?  ?

## 2021-08-28 ENCOUNTER — Ambulatory Visit (INDEPENDENT_AMBULATORY_CARE_PROVIDER_SITE_OTHER): Payer: Managed Care, Other (non HMO) | Admitting: Family Medicine

## 2021-08-28 ENCOUNTER — Encounter: Payer: Self-pay | Admitting: Family Medicine

## 2021-08-28 VITALS — BP 120/74 | HR 107 | Resp 16 | Ht 65.0 in | Wt 194.0 lb

## 2021-08-28 DIAGNOSIS — E66811 Obesity, class 1: Secondary | ICD-10-CM | POA: Insufficient documentation

## 2021-08-28 DIAGNOSIS — G5601 Carpal tunnel syndrome, right upper limb: Secondary | ICD-10-CM | POA: Insufficient documentation

## 2021-08-28 DIAGNOSIS — E669 Obesity, unspecified: Secondary | ICD-10-CM

## 2021-08-28 DIAGNOSIS — Z1211 Encounter for screening for malignant neoplasm of colon: Secondary | ICD-10-CM

## 2021-08-28 DIAGNOSIS — E785 Hyperlipidemia, unspecified: Secondary | ICD-10-CM

## 2021-08-28 DIAGNOSIS — M1811 Unilateral primary osteoarthritis of first carpometacarpal joint, right hand: Secondary | ICD-10-CM

## 2021-08-28 DIAGNOSIS — E559 Vitamin D deficiency, unspecified: Secondary | ICD-10-CM

## 2021-08-28 DIAGNOSIS — K219 Gastro-esophageal reflux disease without esophagitis: Secondary | ICD-10-CM | POA: Diagnosis not present

## 2021-08-28 DIAGNOSIS — R928 Other abnormal and inconclusive findings on diagnostic imaging of breast: Secondary | ICD-10-CM

## 2021-08-28 MED ORDER — PANTOPRAZOLE SODIUM 20 MG PO TBEC: 20.0000 mg | DELAYED_RELEASE_TABLET | Freq: Every day | ORAL | 0 refills | Status: DC

## 2021-08-28 MED ORDER — WEGOVY 0.5 MG/0.5ML ~~LOC~~ SOAJ: 0.5000 mg | SUBCUTANEOUS | 1 refills | Status: DC

## 2021-08-28 MED ORDER — WEGOVY 0.25 MG/0.5ML ~~LOC~~ SOAJ: 0.2500 mg | SUBCUTANEOUS | 0 refills | Status: DC

## 2021-08-28 NOTE — Assessment & Plan Note (Signed)
Refer back to Dr. Rudene Christians  ?

## 2021-08-28 NOTE — Addendum Note (Signed)
Addended by: Carlene Coria on: 08/28/2021 03:05 PM ? ? Modules accepted: Orders ? ?

## 2021-08-28 NOTE — Addendum Note (Signed)
Addended by: Carlene Coria on: 08/28/2021 03:09 PM ? ? Modules accepted: Orders ? ?

## 2021-08-28 NOTE — Assessment & Plan Note (Signed)
On diet only  ?

## 2021-08-28 NOTE — Assessment & Plan Note (Signed)
Continue supplementation  ?

## 2021-08-28 NOTE — Assessment & Plan Note (Signed)
Starting [facility] weight of 194 lbs  ?

## 2021-08-28 NOTE — Assessment & Plan Note (Signed)
Controlled.  

## 2021-08-29 ENCOUNTER — Other Ambulatory Visit: Payer: Self-pay | Admitting: Family Medicine

## 2021-08-29 ENCOUNTER — Encounter: Payer: Self-pay | Admitting: Family Medicine

## 2021-08-29 DIAGNOSIS — E669 Obesity, unspecified: Secondary | ICD-10-CM

## 2021-08-29 DIAGNOSIS — K219 Gastro-esophageal reflux disease without esophagitis: Secondary | ICD-10-CM

## 2021-08-29 MED ORDER — PANTOPRAZOLE SODIUM 20 MG PO TBEC: 20.0000 mg | DELAYED_RELEASE_TABLET | Freq: Every day | ORAL | 0 refills | Status: DC

## 2021-08-29 MED ORDER — WEGOVY 0.5 MG/0.5ML ~~LOC~~ SOAJ: 0.5000 mg | SUBCUTANEOUS | 1 refills | Status: DC

## 2021-08-29 MED ORDER — WEGOVY 0.25 MG/0.5ML ~~LOC~~ SOAJ: 0.2500 mg | SUBCUTANEOUS | 0 refills | Status: DC

## 2021-08-29 NOTE — Telephone Encounter (Signed)
Patient had visit yesterday- change in pharmacy- Rx forwarded to requested pharmacy ?

## 2021-08-29 NOTE — Telephone Encounter (Signed)
Pt's insurance is requiring her to use [facility] for her medications. ?Pt had appointment w/ Dr Ancil Boozer yesterday. ?She would like you to resend her  ?pantoprazole (PROTONIX) 20 MG tablet and ?Semaglutide-Weight Management (WEGOVY) 0.25 MG/0.5ML SOAJ ?Semaglutide-Weight Management (WEGOVY) 0.5 MG/0.5ML SOAJ ? ?[id number] - [place], [place] ?

## 2021-08-31 ENCOUNTER — Other Ambulatory Visit: Payer: Self-pay | Admitting: Family Medicine

## 2021-08-31 ENCOUNTER — Other Ambulatory Visit: Payer: Self-pay

## 2021-08-31 DIAGNOSIS — E559 Vitamin D deficiency, unspecified: Secondary | ICD-10-CM

## 2021-08-31 MED ORDER — VITAMIN D (ERGOCALCIFEROL) 1.25 MG (50000 UNIT) PO CAPS: 50000.0000 [IU] | ORAL_CAPSULE | ORAL | 0 refills | Status: DC

## 2021-09-14 ENCOUNTER — Other Ambulatory Visit: Payer: Managed Care, Other (non HMO)

## 2021-09-15 ENCOUNTER — Ambulatory Visit
Admission: RE | Admit: 2021-09-15 | Discharge: 2021-09-15 | Disposition: A | Discharge status: Home / Self Care | Payer: Managed Care, Other (non HMO) | Source: Ambulatory Visit | Attending: Family Medicine | Admitting: Family Medicine

## 2021-09-15 DIAGNOSIS — R928 Other abnormal and inconclusive findings on diagnostic imaging of breast: Secondary | ICD-10-CM | POA: Diagnosis present

## 2021-09-20 ENCOUNTER — Other Ambulatory Visit: Payer: Self-pay

## 2021-09-20 ENCOUNTER — Encounter: Payer: Managed Care, Other (non HMO) | Admitting: Family Medicine

## 2021-09-28 LAB — COLOGUARD: COLOGUARD: NEGATIVE

## 2021-10-03 ENCOUNTER — Other Ambulatory Visit (HOSPITAL_COMMUNITY)
Admission: RE | Admit: 2021-10-03 | Discharge: 2021-10-03 | Disposition: A | Discharge status: Home / Self Care | Payer: Managed Care, Other (non HMO) | Source: Ambulatory Visit | Attending: Family Medicine | Admitting: Family Medicine

## 2021-10-03 ENCOUNTER — Ambulatory Visit (INDEPENDENT_AMBULATORY_CARE_PROVIDER_SITE_OTHER): Payer: Managed Care, Other (non HMO) | Admitting: Family Medicine

## 2021-10-03 ENCOUNTER — Encounter: Payer: Self-pay | Admitting: Family Medicine

## 2021-10-03 ENCOUNTER — Other Ambulatory Visit: Payer: Self-pay

## 2021-10-03 VITALS — BP 122/84 | HR 84 | Temp 98.0°F | Resp 16 | Ht 65.0 in | Wt 185.8 lb

## 2021-10-03 DIAGNOSIS — E785 Hyperlipidemia, unspecified: Secondary | ICD-10-CM

## 2021-10-03 DIAGNOSIS — E559 Vitamin D deficiency, unspecified: Secondary | ICD-10-CM | POA: Diagnosis not present

## 2021-10-03 DIAGNOSIS — Z0000 Encounter for general adult medical examination without abnormal findings: Secondary | ICD-10-CM | POA: Insufficient documentation

## 2021-10-03 DIAGNOSIS — R7303 Prediabetes: Secondary | ICD-10-CM | POA: Insufficient documentation

## 2021-10-03 DIAGNOSIS — R32 Unspecified urinary incontinence: Secondary | ICD-10-CM

## 2021-10-03 DIAGNOSIS — E669 Obesity, unspecified: Secondary | ICD-10-CM

## 2021-10-03 DIAGNOSIS — K219 Gastro-esophageal reflux disease without esophagitis: Secondary | ICD-10-CM | POA: Diagnosis not present

## 2021-10-03 DIAGNOSIS — R87810 Cervical high risk human papillomavirus (HPV) DNA test positive: Secondary | ICD-10-CM

## 2021-10-03 NOTE — Assessment & Plan Note (Signed)
Recheck A1c. Continue wegovy once able to receive.

## 2021-10-03 NOTE — Assessment & Plan Note (Signed)
Doing well s/p pelvic PT.

## 2021-10-03 NOTE — Assessment & Plan Note (Signed)
Continue lifestyle modifications and wegovy.

## 2021-10-03 NOTE — Progress Notes (Signed)
BP 122/84   Pulse 84   Temp 98 F (36.7 C) (Oral)   Resp 16   Ht 5' 5"  (1.651 m)   Wt 185 lb 12.8 oz (84.3 kg)   SpO2 99%   BMI 30.92 kg/m    Subjective:    Patient ID: Tanya Robinson, female    DOB: 08-30-1975, 46 y.o.   MRN: [id number]  HPI: Tanya Robinson is a 46 y.o. female presenting on 10/03/2021 for comprehensive medical examination. Current medical complaints include: none  OBESITY - Meds: wegovy 0.52m weekly, started 08/28/21. Did well on 0.262m has been unable to get 0.61m41mose due to backorder. Tolerated well. Has been without since Sunday.  - never been on any other med for weight loss  - Complications of obesity: HLD, prediabetes - Peak weight: 194lb - Weight loss goal: 160lb - Weight loss to date: 9 lb - Diet: 2 meals (breakfast and dinner), 1 snack (midday). Soda now lasts her all day. Eating plenty of fruits and vegetables.  - Exercise: walking more  HLD - medications: diet only - compliance: n/a - medication SEs: n/a - Diet/Exercise: see above  GERD - Meds: pantoprazole - Symptoms:  none.  - Denies chest pain, difficulty swallowing, and heartburn denies dysphagia  denies melena, hematochezia, hematemesis, and coffee ground emesis.   She currently lives with: husband Menopausal Symptoms: no  Depression Screen done today and results listed below:     10/03/2021    9:11 AM 08/28/2021    2:22 PM 06/28/2021   11:40 AM 05/15/2021    3:28 PM 02/20/2021    8:23 AM  Depression screen PHQ 2/9  Decreased Interest 0 0 0 0 0  Down, Depressed, Hopeless 0 0 0 0 0  PHQ - 2 Score 0 0 0 0 0  Altered sleeping   0 0   Tired, decreased energy   0 0   Change in appetite   0 0   Feeling bad or failure about yourself    0 0   Trouble concentrating   0 0   Moving slowly or fidgety/restless   0 0   Suicidal thoughts   0 0   PHQ-9 Score   0 0   Difficult doing work/chores   Not difficult at all      The patient does not have a history of falls.    Past Medical  History:  Past Medical History:  Diagnosis Date   Anxiety    Headache     Surgical History:  Past Surgical History:  Procedure Laterality Date   ABDOMINAL HYSTERECTOMY     TUBAL LIGATION  2009    Medications:  Current Outpatient Medications on File Prior to Visit  Medication Sig   Cholecalciferol (VITAMIN D) 2000 units CAPS Take 1 capsule (2,000 Units total) by mouth daily.   EPINEPHrine 0.3 mg/0.3 mL IJ SOAJ injection    pantoprazole (PROTONIX) 20 MG tablet Take 1 tablet (20 mg total) by mouth daily.   Vitamin D, Ergocalciferol, (DRISDOL) 1.25 MG (50000 UNIT) CAPS capsule Take 1 capsule (50,000 Units total) by mouth every 7 (seven) days.   Semaglutide-Weight Management (WEGOVY) 0.25 MG/0.5ML SOAJ Inject 0.25 mg into the skin once a week. (Patient not taking: Reported on 10/03/2021)   Semaglutide-Weight Management (WEGOVY) 0.5 MG/0.5ML SOAJ Inject 0.5 mg into the skin once a week. (Patient not taking: Reported on 10/03/2021)   No current facility-administered medications on file prior to visit.    Allergies:  Allergies  Allergen Reactions   Tea Hives   Bee Venom     Bee    Social History:  Social History   Socioeconomic History   Marital status: Married    Spouse name: Niue    Number of children: 2   Years of education: Not on file   Highest education level: High school graduate  Occupational History   Occupation: Warehouse manager: [employer]  Tobacco Use   Smoking status: Never   Smokeless tobacco: Never  Vaping Use   Vaping Use: Never used  Substance and Sexual Activity   Alcohol use: Not Currently    Comment: occasionally   Drug use: No   Sexual activity: Yes    Partners: Male    Birth control/protection: Surgical  Other Topics Concern   Not on file  Social History Narrative   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 She started at [employer] April 2019, she has been working from home since March 2020 because of pandemic     She has been living with boyfriend ( together since 2011)   He was previously married, and he has twins from previously relationship.    Her two children, are grown now. A son and a daughter.    Daughter had a miscarriage in 2021   Social Determinants of Health   Financial Resource Strain: Low Risk  (10/03/2021)   Overall Financial Resource Strain (CARDIA)    Difficulty of Paying Living Expenses: Not hard at all  Food Insecurity: No Food Insecurity (10/03/2021)   Hunger Vital Sign    Worried About Running Out of Food in the Last Year: Never true    Ran Out of Food in the Last Year: Never true  Transportation Needs: No Transportation Needs (10/03/2021)   PRAPARE - Hydrologist (Medical): No    Lack of Transportation (Non-Medical): No  Physical Activity: Insufficiently Active (10/03/2021)   Exercise Vital Sign    Days of Exercise per Week: 2 days    Minutes of Exercise per Session: 20 min  Stress: No Stress Concern Present (10/03/2021)   [place]    Feeling of Stress : Not at all  Social Connections: Moderately Integrated (10/03/2021)   Social Connection and Isolation Panel [NHANES]    Frequency of Communication with Friends and Family: More than three times a week    Frequency of Social Gatherings with Friends and Family: Three times a week    Attends Religious Services: 1 to 4 times per year    Active Member of Clubs or Organizations: No    Attends Archivist Meetings: Never    Marital Status: Married  Human resources officer Violence: Not At Risk (10/03/2021)   Humiliation, Afraid, Rape, and Kick questionnaire    Fear of Current or Ex-Partner: No    Emotionally Abused: No    Physically Abused: No    Sexually Abused: No   Social History   Tobacco Use  Smoking Status Never  Smokeless Tobacco Never   Social History   Substance and Sexual Activity  Alcohol Use Not Currently    Comment: occasionally    Family History:  Family History  Problem Relation Age of Onset   Hypertension Mother    Hyperlipidemia Mother    Diabetes Father     Past medical history, surgical history, medications, allergies, family history and social history reviewed with patient  today and changes made to appropriate areas of the chart.      Objective:    BP 122/84   Pulse 84   Temp 98 F (36.7 C) (Oral)   Resp 16   Ht 5' 5"  (1.651 m)   Wt 185 lb 12.8 oz (84.3 kg)   SpO2 99%   BMI 30.92 kg/m   Wt Readings from Last 3 Encounters:  10/03/21 185 lb 12.8 oz (84.3 kg)  08/28/21 194 lb (88 kg)  06/28/21 190 lb 4.8 oz (86.3 kg)    Physical Exam Vitals reviewed. Exam conducted with a chaperone present.  Constitutional:      Appearance: She is not ill-appearing.  HENT:     Head: Normocephalic.     Right Ear: Tympanic membrane, ear canal and external ear normal.     Left Ear: Tympanic membrane, ear canal and external ear normal.     Nose: Nose normal.     Mouth/Throat:     Mouth: Mucous membranes are moist.     Pharynx: Oropharynx is clear.  Eyes:     Extraocular Movements: Extraocular movements intact.     Pupils: Pupils are equal, round, and reactive to light.  Cardiovascular:     Rate and Rhythm: Normal rate and regular rhythm.     Heart sounds: Normal heart sounds. No murmur heard. Pulmonary:     Effort: Pulmonary effort is normal. No respiratory distress.     Breath sounds: Normal breath sounds.  Abdominal:     General: Bowel sounds are normal.     Palpations: Abdomen is soft.     Tenderness: There is no abdominal tenderness.  Genitourinary:    General: Normal vulva.     Exam position: Lithotomy position.     Vagina: Normal.     Cervix: No cervical motion tenderness, friability or cervical bleeding.     Uterus: Absent.      Adnexa: Right adnexa normal and left adnexa normal.  Musculoskeletal:        General: Normal range of motion.     Cervical back: Normal  range of motion.     Right lower leg: No edema.     Left lower leg: No edema.  Skin:    General: Skin is warm and dry.  Neurological:     Mental Status: She is alert and oriented to person, place, and time. Mental status is at baseline.     Gait: Gait normal.     Results for orders placed or performed in visit on 10/03/21  Advanced Written Notification [facility]) Test Refusal  Result Value Ref Range   RAM1     AWN TEST REFUSED 17306       Assessment & Plan:   Problem List Items Addressed This Visit       Digestive   GERD without esophagitis   Relevant Orders   Comprehensive metabolic panel   Comprehensive metabolic panel     Other   Cervical high risk HPV (human papillomavirus) test positive    Pap and cotesting performed today      Relevant Orders   Cytology - PAP   Dyslipidemia   Relevant Orders   Lipid panel   Comprehensive metabolic panel   Hemoglobin A1c   Lipid panel   Comprehensive metabolic panel   Hemoglobin A1c   Obesity (BMI 30.0-34.9)    Continue lifestyle modifications and wegovy.       Relevant Orders   Lipid panel   Comprehensive metabolic panel  Hemoglobin A1c   Lipid panel   Comprehensive metabolic panel   Hemoglobin A1c   Prediabetes    Recheck A1c. Continue wegovy once able to receive.      Relevant Orders   Lipid panel   Comprehensive metabolic panel   Hemoglobin A1c   Lipid panel   Comprehensive metabolic panel   Hemoglobin A1c   Urinary incontinence in female    Doing well s/p pelvic PT.      Vitamin D deficiency   Relevant Orders   VITAMIN D 25 Hydroxy (Vit-D Deficiency, Fractures)   Other Visit Diagnoses     Annual physical exam    -  Primary   Relevant Orders   Lipid panel   Comprehensive metabolic panel   CBC with Differential   Hemoglobin A1c   VITAMIN D 25 Hydroxy (Vit-D Deficiency, Fractures)   Cytology - PAP   Comprehensive metabolic panel   Hemoglobin A1c        Follow up plan: No follow-ups on  file.   LABORATORY TESTING:  - Pap smear: pap done  IMMUNIZATIONS:   - Tdap: Tetanus vaccination status reviewed: last tetanus booster within 10 years. - Influenza: Administered today - Pneumovax: Not applicable - Prevnar: Not applicable - HPV: Not applicable - Shingrix vaccine: Not applicable - COVID vaccine: has received 3 doses of mRNA vaccine  SCREENING: - Mammogram: UTD - Colonoscopy:  Cologuard previously ordered   - Bone Density: Not applicable  - Lung Cancer Screening: Not applicable   Hep C Screening: UTD STD testing and prevention (HIV/chl/gon/syphilis): no concerns Sexual History: monogamous Menstrual History/LMP/Abnormal Bleeding: s/p ABH Incontinence Symptoms: none  Osteoporosis: Discussed high calcium and vitamin D supplementation, weight bearing exercises  Advanced Care Planning: A voluntary discussion about advance care planning including the explanation and discussion of advance directives.  Discussed health care proxy and Living will, and the patient was able to identify a health care proxy as husband, Niue Kaffenberger.  Patient does not have a living will at present time. If patient does have living will, I have requested they bring this to the clinic to be scanned in to their chart.  PATIENT COUNSELING:   Advised to take 1 mg of folate supplement per day if capable of pregnancy.   Sexuality: Discussed sexually transmitted diseases, partner selection, use of condoms, avoidance of unintended pregnancy  and contraceptive alternatives.   Advised to avoid cigarette smoking.  I discussed with the patient that most people either abstain from alcohol or drink within safe limits (<=14/week and <=4 drinks/occasion for males, <=7/weeks and <= 3 drinks/occasion for females) and that the risk for alcohol disorders and other health effects rises proportionally with the number of drinks per week and how often a drinker exceeds daily limits.  Discussed cessation/primary  prevention of drug use and availability of treatment for abuse.   Diet: Encouraged to adjust caloric intake to maintain  or achieve ideal body weight, to reduce intake of dietary saturated fat and total fat, to limit sodium intake by avoiding high sodium foods and not adding table salt, and to maintain adequate dietary potassium and calcium preferably from fresh fruits, vegetables, and low-fat dairy products.    stressed the importance of regular exercise  Injury prevention: Discussed safety belts, safety helmets, smoke detector, smoking near bedding or upholstery.   Dental health: Discussed importance of regular tooth brushing, flossing, and dental visits.    NEXT PREVENTATIVE PHYSICAL DUE IN 1 YEAR. No follow-ups on file.

## 2021-10-03 NOTE — Assessment & Plan Note (Signed)
Pap and cotesting performed today

## 2021-10-04 LAB — CBC WITH DIFFERENTIAL/PLATELET
Absolute Monocytes: 444 cells/uL (ref 200–950)
Basophils Absolute: 61 cells/uL (ref 0–200)
Basophils Relative: 1.2 %
Eosinophils Absolute: 102 cells/uL (ref 15–500)
Eosinophils Relative: 2 %
HCT: 42.7 % (ref 35.0–45.0)
Hemoglobin: 14.1 g/dL (ref 11.7–15.5)
Lymphs Abs: 1464 cells/uL (ref 850–3900)
MCH: 28.9 pg (ref 27.0–33.0)
MCHC: 33 g/dL (ref 32.0–36.0)
MCV: 87.5 fL (ref 80.0–100.0)
MPV: 8.9 fL (ref 7.5–12.5)
Monocytes Relative: 8.7 %
Neutro Abs: 3029 cells/uL (ref 1500–7800)
Neutrophils Relative %: 59.4 %
Platelets: 385 10*3/uL (ref 140–400)
RBC: 4.88 10*6/uL (ref 3.80–5.10)
RDW: 13.6 % (ref 11.0–15.0)
Total Lymphocyte: 28.7 %
WBC: 5.1 10*3/uL (ref 3.8–10.8)

## 2021-10-04 LAB — ADVANCED WRITTEN NOTIFICATION (AWN) TEST REFUSAL: AWN TEST REFUSED: 17306

## 2021-10-04 LAB — COMPREHENSIVE METABOLIC PANEL
AG Ratio: 1.7 (calc) (ref 1.0–2.5)
ALT: 8 U/L (ref 6–29)
AST: 11 U/L (ref 10–35)
Albumin: 4.3 g/dL (ref 3.6–5.1)
Alkaline phosphatase (APISO): 50 U/L (ref 31–125)
BUN/Creatinine Ratio: 4 (calc) — ABNORMAL LOW (ref 6–22)
BUN: 5 mg/dL — ABNORMAL LOW (ref 7–25)
CO2: 28 mmol/L (ref 20–32)
Calcium: 9.9 mg/dL (ref 8.6–10.2)
Chloride: 106 mmol/L (ref 98–110)
Creat: 1.13 mg/dL — ABNORMAL HIGH (ref 0.50–0.99)
Globulin: 2.6 g/dL (calc) (ref 1.9–3.7)
Glucose, Bld: 90 mg/dL (ref 65–99)
Potassium: 5.6 mmol/L — ABNORMAL HIGH (ref 3.5–5.3)
Sodium: 142 mmol/L (ref 135–146)
Total Bilirubin: 0.4 mg/dL (ref 0.2–1.2)
Total Protein: 6.9 g/dL (ref 6.1–8.1)

## 2021-10-04 LAB — HEMOGLOBIN A1C
Hgb A1c MFr Bld: 5.5 % of total Hgb (ref <5.7)
Mean Plasma Glucose: 111 mg/dL
eAG (mmol/L): 6.2 mmol/L

## 2021-10-04 LAB — LIPID PANEL
Cholesterol: 253 mg/dL — ABNORMAL HIGH (ref <200)
HDL: 59 mg/dL (ref >=50)
LDL Cholesterol (Calc): 173 mg/dL (calc) — ABNORMAL HIGH
Non-HDL Cholesterol (Calc): 194 mg/dL (calc) — ABNORMAL HIGH (ref <130)
Total CHOL/HDL Ratio: 4.3 (calc) (ref <5.0)
Triglycerides: 91 mg/dL (ref <150)

## 2021-10-06 LAB — CYTOLOGY - PAP
Chlamydia: NEGATIVE
Comment: NEGATIVE
Comment: NEGATIVE
Comment: NEGATIVE
Comment: NORMAL
Diagnosis: NEGATIVE
Diagnosis: REACTIVE
High risk HPV: NEGATIVE
Neisseria Gonorrhea: NEGATIVE
Trichomonas: NEGATIVE

## 2021-10-11 ENCOUNTER — Encounter: Payer: Self-pay | Admitting: Family Medicine

## 2021-10-11 ENCOUNTER — Other Ambulatory Visit: Payer: Self-pay | Admitting: Family Medicine

## 2021-10-11 MED ORDER — INSULIN PEN NEEDLE 32G X 6 MM MISC: 1.0000 | Freq: Every day | 1 refills | Status: DC

## 2021-10-11 MED ORDER — SAXENDA 18 MG/3ML ~~LOC~~ SOPN
1.2000 mg | PEN_INJECTOR | Freq: Every day | SUBCUTANEOUS | 0 refills | Status: DC
Start: 2021-10-11 — End: 2021-11-13

## 2021-11-10 NOTE — Progress Notes (Unsigned)
Name: Tanya Robinson   MRN: [id number]    DOB: 07/30/1975   Date:11/13/2021       Progress Note  Subjective  Chief Complaint  Follow up   HPI  Obesity: she states her high school weight was 140 lbs. When she delivered her son she was 176 lbs in 1999 and maintaining the weight around 170 lbs for many years. Over the past few years weight has been trending up, staying in the 180 lbs and earlier this visit her weight was 194 lbs. She was  avoiding junk food and sodas. She states she has been eating larger portions during meals and unable to stop. She tried [place] and because of nation wide shortage we tried too give [place] but out of stock, she lost she lost 8 lbs but now appetite is up again but weight is stable since June after the weight loss. She will try resuming Wegovy in the early Fall when back in stock   Hyperlipidemia: mother takes cholesterol medication, father has diabetes, her LDL last year was 56 and in June 2023 was down to 173 discussed familial dyslipidemia. She does not want to start medications at this time, eating healthier   The 10-year ASCVD risk score (Arnett DK, et al., 2019) is: 1.5%   Values used to calculate the score:     Age: 46 years     Sex: Female     Is Non-Hispanic African American: Yes     Diabetic: No     Tobacco smoker: No     Systolic Blood Pressure: 124 mmHg     Is BP treated: No     HDL Cholesterol: 59 mg/dL     Total Cholesterol: 253 mg/dL   Vitamin D : she is taking otc vitamin D .   GERD: she went to [facility] 01/23 with severe burning sensation on esophagus and dysphagia. She was given GI cocktail and sent home on PPI. Symptoms are controlled with medications. No problems since, she is now only taking it a few times a week.   Carpal tunnel and right wrist carpometacarpal OA: she was seen by PT and continues to have pain , she went back to Ortho was advised to use ice and alternate with heat, if needed go back steroids and if she fails steroids she will  have surgery   Pre-diabetes: last A1C improved, avoiding sodas, discussed cutting down on breads   Patient Active Problem List   Diagnosis Date Noted   Prediabetes 10/03/2021   Obesity (BMI 30.0-34.9) 08/28/2021   Primary osteoarthritis of first carpometacarpal joint of right hand 08/28/2021   Carpal tunnel syndrome of right wrist 08/28/2021   GERD without esophagitis 05/15/2021   Cervical high risk HPV (human papillomavirus) test positive 07/30/2016   Bee sting allergy 07/28/2015   Dyslipidemia 11/02/2014   Migraine without aura and responsive to treatment 11/02/2014   Vitamin D deficiency 11/02/2014   Urinary incontinence in female 11/02/2014    Past Surgical History:  Procedure Laterality Date   ABDOMINAL HYSTERECTOMY     TUBAL LIGATION  2009    Family History  Problem Relation Age of Onset   Hypertension Mother    Hyperlipidemia Mother    Diabetes Father     Social History   Tobacco Use   Smoking status: Never   Smokeless tobacco: Never  Substance Use Topics   Alcohol use: Not Currently    Comment: occasionally     Current Outpatient Medications:    Cholecalciferol (VITAMIN  D) 2000 units CAPS, Take 1 capsule (2,000 Units total) by mouth daily., Disp: 30 capsule, Rfl: 0   EPINEPHrine 0.3 mg/0.3 mL IJ SOAJ injection, , Disp: , Rfl:    Insulin Pen Needle 32G X 6 MM MISC, 1 each by Does not apply route daily at 2 PM., Disp: 100 each, Rfl: 1   pantoprazole (PROTONIX) 20 MG tablet, Take 1 tablet (20 mg total) by mouth daily., Disp: 90 tablet, Rfl: 0   Vitamin D, Ergocalciferol, (DRISDOL) 1.25 MG (50000 UNIT) CAPS capsule, Take 1 capsule (50,000 Units total) by mouth every 7 (seven) days., Disp: 12 capsule, Rfl: 0   Liraglutide -Weight Management (SAXENDA) 18 MG/3ML SOPN, Inject 1.2 mg into the skin daily. (Patient not taking: Reported on 11/13/2021), Disp: 3 mL, Rfl: 0  Allergies  Allergen Reactions   Tea Hives   Bee Venom     Bee    I personally reviewed active  problem list, medication list, allergies, family history, social history, health maintenance with the patient/caregiver today.   ROS  Constitutional: Negative for fever , positive for  weight change.  Respiratory: Negative for cough and shortness of breath.   Cardiovascular: Negative for chest pain or palpitations.  Gastrointestinal: Negative for abdominal pain, no bowel changes.  Musculoskeletal: Negative for gait problem or joint swelling.  Skin: Negative for rash.  Neurological: Negative for dizziness or headache.  No other specific complaints in a complete review of systems (except as listed in HPI above).   Objective  Vitals:   11/13/21 1551  BP: 124/82  Pulse: 71  Resp: 16  Temp: 98.1 F (36.7 C)  TempSrc: Oral  SpO2: 99%  Weight: 186 lb 8 oz (84.6 kg)  Height: 5' 6.75" (1.695 m)    Body mass index is 29.43 kg/m.  Physical Exam  Constitutional: Patient appears well-developed and well-nourished. Overweight/  No distress.  HEENT: head atraumatic, normocephalic, pupils equal and reactive to light, neck supple Cardiovascular: Normal rate, regular rhythm and normal heart sounds.  No murmur heard. No BLE edema. Pulmonary/Chest: Effort normal and breath sounds normal. No respiratory distress. Abdominal: Soft.  There is no tenderness. Psychiatric: Patient has a normal mood and affect. behavior is normal. Judgment and thought content normal.     PHQ2/9:    11/13/2021    3:53 PM 10/03/2021    9:11 AM 08/28/2021    2:22 PM 06/28/2021   11:40 AM 05/15/2021    3:28 PM  Depression screen PHQ 2/9  Decreased Interest 0 0 0 0 0  Down, Depressed, Hopeless 0 0 0 0 0  PHQ - 2 Score 0 0 0 0 0  Altered sleeping    0 0  Tired, decreased energy    0 0  Change in appetite    0 0  Feeling bad or failure about yourself     0 0  Trouble concentrating    0 0  Moving slowly or fidgety/restless    0 0  Suicidal thoughts    0 0  PHQ-9 Score    0 0  Difficult doing work/chores    Not  difficult at all     phq 9 is negative   Fall Risk:    11/13/2021    3:53 PM 10/03/2021    9:10 AM 08/28/2021    2:22 PM 06/28/2021   11:40 AM 05/15/2021    3:28 PM  Fall Risk   Falls in the past year? 0 0 0 0 0  Number falls  in past yr:  0 0 0 0  Injury with Fall?  0 0 0 0  Risk for fall due to : No Fall Risks  No Fall Risks No Fall Risks No Fall Risks  Follow up Falls prevention discussed Falls evaluation completed Falls prevention discussed Falls prevention discussed Falls prevention discussed      Functional Status Survey: Is the patient deaf or have difficulty hearing?: No Does the patient have difficulty seeing, even when wearing glasses/contacts?: No Does the patient have difficulty concentrating, remembering, or making decisions?: No Does the patient have difficulty walking or climbing stairs?: No Does the patient have difficulty dressing or bathing?: No Does the patient have difficulty doing errands alone such as visiting a doctor's office or shopping?: No    Assessment & Plan  1. Obesity (BMI 30.0-34.9)  - Semaglutide-Weight Management 0.25 MG/0.5ML SOAJ; Inject 0.25 mg into the skin once a week for 28 days.  Dispense: 2 mL; Refill: 0 - Semaglutide-Weight Management 0.5 MG/0.5ML SOAJ; Inject 0.5 mg into the skin once a week for 28 days.  Dispense: 2 mL; Refill: 0 - Semaglutide-Weight Management 1 MG/0.5ML SOAJ; Inject 1 mg into the skin once a week for 28 days. After the 0.5 mg dose  Dispense: 2 mL; Refill: 0  2. GERD without esophagitis  - pantoprazole (PROTONIX) 20 MG tablet; Take 1 tablet (20 mg total) by mouth daily.  Dispense: 90 tablet; Refill: 1  3. Prediabetes  On life style modifications  4. Dyslipidemia  Refused statin therapy   5. Vitamin D deficiency  - Vitamin D, Ergocalciferol, (DRISDOL) 1.25 MG (50000 UNIT) CAPS capsule; Take 1 capsule (50,000 Units total) by mouth every 7 (seven) days.  Dispense: 12 capsule; Refill: 0

## 2021-11-13 ENCOUNTER — Encounter: Payer: Self-pay | Admitting: Family Medicine

## 2021-11-13 ENCOUNTER — Ambulatory Visit: Payer: Managed Care, Other (non HMO) | Admitting: Family Medicine

## 2021-11-13 VITALS — BP 124/82 | HR 71 | Temp 98.1°F | Resp 16 | Ht 66.75 in | Wt 186.5 lb

## 2021-11-13 DIAGNOSIS — E785 Hyperlipidemia, unspecified: Secondary | ICD-10-CM

## 2021-11-13 DIAGNOSIS — R7303 Prediabetes: Secondary | ICD-10-CM

## 2021-11-13 DIAGNOSIS — K219 Gastro-esophageal reflux disease without esophagitis: Secondary | ICD-10-CM | POA: Diagnosis not present

## 2021-11-13 DIAGNOSIS — E669 Obesity, unspecified: Secondary | ICD-10-CM

## 2021-11-13 DIAGNOSIS — E559 Vitamin D deficiency, unspecified: Secondary | ICD-10-CM

## 2021-11-13 MED ORDER — VITAMIN D (ERGOCALCIFEROL) 1.25 MG (50000 UNIT) PO CAPS: 50000.0000 [IU] | ORAL_CAPSULE | ORAL | 0 refills | Status: DC

## 2021-11-13 MED ORDER — PANTOPRAZOLE SODIUM 20 MG PO TBEC: 20.0000 mg | DELAYED_RELEASE_TABLET | Freq: Every day | ORAL | 1 refills | Status: DC

## 2021-11-13 MED ORDER — SEMAGLUTIDE-WEIGHT MANAGEMENT 0.25 MG/0.5ML ~~LOC~~ SOAJ: 0.2500 mg | SUBCUTANEOUS | 0 refills | Status: AC

## 2021-11-13 MED ORDER — SEMAGLUTIDE-WEIGHT MANAGEMENT 1 MG/0.5ML ~~LOC~~ SOAJ: 1.0000 mg | SUBCUTANEOUS | 0 refills | Status: AC

## 2021-11-13 MED ORDER — SEMAGLUTIDE-WEIGHT MANAGEMENT 0.5 MG/0.5ML ~~LOC~~ SOAJ: 0.5000 mg | SUBCUTANEOUS | 0 refills | Status: AC

## 2022-02-03 ENCOUNTER — Other Ambulatory Visit: Payer: Self-pay | Admitting: Family Medicine

## 2022-02-03 DIAGNOSIS — E559 Vitamin D deficiency, unspecified: Secondary | ICD-10-CM

## 2022-03-12 ENCOUNTER — Ambulatory Visit (INDEPENDENT_AMBULATORY_CARE_PROVIDER_SITE_OTHER): Payer: Managed Care, Other (non HMO) | Admitting: Nurse Practitioner

## 2022-03-12 ENCOUNTER — Other Ambulatory Visit: Payer: Self-pay

## 2022-03-12 ENCOUNTER — Encounter: Payer: Self-pay | Admitting: Nurse Practitioner

## 2022-03-12 VITALS — BP 120/74 | HR 77 | Temp 98.4°F | Resp 18 | Ht 65.0 in | Wt 186.8 lb

## 2022-03-12 DIAGNOSIS — Z23 Encounter for immunization: Secondary | ICD-10-CM

## 2022-03-12 DIAGNOSIS — R300 Dysuria: Secondary | ICD-10-CM

## 2022-03-12 LAB — POCT URINALYSIS DIPSTICK
Bilirubin, UA: NEGATIVE
Glucose, UA: NEGATIVE
Ketones, UA: NEGATIVE
Nitrite, UA: NEGATIVE
Protein, UA: POSITIVE — AB
Spec Grav, UA: 1.025 (ref 1.010–1.025)
Urobilinogen, UA: 0.2 E.U./dL
pH, UA: 7 (ref 5.0–8.0)

## 2022-03-12 MED ORDER — CEPHALEXIN 500 MG PO CAPS: 500.0000 mg | ORAL_CAPSULE | Freq: Two times a day (BID) | ORAL | 0 refills | Status: AC

## 2022-03-12 MED ORDER — PHENAZOPYRIDINE HCL 100 MG PO TABS: 100.0000 mg | ORAL_TABLET | Freq: Three times a day (TID) | ORAL | 0 refills | Status: DC | PRN

## 2022-03-12 NOTE — Progress Notes (Signed)
BP 120/74   Pulse 77   Temp 98.4 F (36.9 C) (Oral)   Resp 18   Ht 5\' 5"  (1.651 m)   Wt 186 lb 12.8 oz (84.7 kg)   SpO2 97%   BMI 31.09 kg/m    Subjective:    Patient ID: , female    DOB: [birth year], 46 y.o.   MRN: [id number]  HPI: Tanya Robinson is a 46 y.o. female  Chief Complaint  Patient presents with   Hematuria    Pressure, feels like bladder always full but it is not emptying. When wiping see blood from vagina   Dysuria: She says that she has had dysuria for about 3 days. She says that last night that she noticed some blood in her urine. She says she was feeling like she was not able to empty her bladder.  She says that she has had a lot of frequency and urgency.  She denies any back pain, flank pain or fever. She denies any recent sexual activity. Urine dip shows positive blood, protein and leukocytes. Discussed likely a uti. Will start treatment and send urine for culture.  Push fluids.   Relevant past medical, surgical, family and social history reviewed and updated as indicated. Interim medical history since our last visit reviewed. Allergies and medications reviewed and updated.  Review of Systems  Constitutional: Negative for fever or weight change.  Respiratory: Negative for cough and shortness of breath.   Cardiovascular: Negative for chest pain or palpitations.  Gastrointestinal: Negative for abdominal pain, no bowel changes.  Musculoskeletal: Negative for gait problem or joint swelling.  Skin: Negative for rash.  Neurological: Negative for dizziness or headache.  No other specific complaints in a complete review of systems (except as listed in HPI above).      Objective:    BP 120/74   Pulse 77   Temp 98.4 F (36.9 C) (Oral)   Resp 18   Ht 5\' 5"  (1.651 m)   Wt 186 lb 12.8 oz (84.7 kg)   SpO2 97%   BMI 31.09 kg/m   Wt Readings from Last 3 Encounters:  03/12/22 186 lb 12.8 oz (84.7 kg)  11/13/21 186 lb 8 oz (84.6 kg)  10/03/21 185 lb  12.8 oz (84.3 kg)    Physical Exam  Constitutional: Patient appears well-developed and well-nourished. Obese  No distress.  HEENT: head atraumatic, normocephalic, pupils equal and reactive to light, neck supple Cardiovascular: Normal rate, regular rhythm and normal heart sounds.  No murmur heard. No BLE edema. Pulmonary/Chest: Effort normal and breath sounds normal. No respiratory distress. Abdominal: Soft.  There is no tenderness. NO CVA tenderness Psychiatric: Patient has a normal mood and affect. behavior is normal. Judgment and thought content normal.  Results for orders placed or performed in visit on 03/12/22  POCT urinalysis dipstick  Result Value Ref Range   Color, UA gold    Clarity, UA cloudy    Glucose, UA Negative Negative   Bilirubin, UA negative    Ketones, UA negative    Spec Grav, UA 1.025 1.010 - 1.025   Blood, UA large    pH, UA 7.0 5.0 - 8.0   Protein, UA Positive (A) Negative   Urobilinogen, UA 0.2 0.2 or 1.0 E.U./dL   Nitrite, UA negative    Leukocytes, UA Small (1+) (A) Negative   Appearance cloudy    Odor none       Assessment & Plan:   Problem List Items Addressed  This Visit   None Visit Diagnoses     Dysuria    -  Primary   Will treat with keflex and pyridium.  Push fluids and sent urine for culture   Relevant Medications   phenazopyridine (PYRIDIUM) 100 MG tablet   cephALEXin (KEFLEX) 500 MG capsule   Other Relevant Orders   POCT urinalysis dipstick (Completed)   Urine Culture   Need for influenza vaccination       Relevant Orders   Flu Vaccine QUAD 6+ mos PF IM (Fluarix Quad PF) (Completed)        Follow up plan: Return if symptoms worsen or fail to improve.

## 2022-03-13 LAB — URINE CULTURE
MICRO NUMBER:: 14212719
Result:: NO GROWTH
SPECIMEN QUALITY:: ADEQUATE

## 2022-05-04 ENCOUNTER — Other Ambulatory Visit: Payer: Self-pay | Admitting: Family Medicine

## 2022-05-04 DIAGNOSIS — E559 Vitamin D deficiency, unspecified: Secondary | ICD-10-CM

## 2022-05-04 NOTE — Telephone Encounter (Signed)
Tried calling her to schedule but no answer or vm

## 2022-05-10 ENCOUNTER — Other Ambulatory Visit: Payer: Self-pay | Admitting: Family Medicine

## 2022-05-10 DIAGNOSIS — K219 Gastro-esophageal reflux disease without esophagitis: Secondary | ICD-10-CM

## 2022-05-14 NOTE — Progress Notes (Signed)
Name: Tanya Robinson   MRN: [id number]    DOB: 14-Jan-1976   Date:05/15/2022       Progress Note  Subjective  Chief Complaint  Follow Up  HPI  Obesity: she states her high school weight was 140 lbs. When she delivered her son she was 176 lbs in 1999 and maintaining the weight around 170 lbs for many years. Over the past few years weight has been trending up, staying in the 180 lbs and early 2023 it went up to  194 lbs.  She has been choosing healthier meals but eats large portions . She tried [place] and because of nation wide shortage we tried too give [place] but out of stock, she had lost she lost 8 lbs on the medication but unable to fill it. Explained there is a pharmacy that seems to have Wegovy in stock and she would like to try it   Hyperlipidemia: mother takes cholesterol medication, father has diabetes, her LDL last year was 72 and in June 2023 was down to 173 discussed familial dyslipidemia. She does not want to start medications at this time, eating healthier She would like to recheck once more in the Summer before she starts medications  The 10-year ASCVD risk score (Arnett DK, et al., 2019) is: 1.7%   Values used to calculate the score:     Age: 65 years     Sex: Female     Is Non-Hispanic African American: Yes     Diabetic: No     Tobacco smoker: No     Systolic Blood Pressure: 244 mmHg     Is BP treated: No     HDL Cholesterol: 59 mg/dL     Total Cholesterol: 253 mg/dL   Vitamin D : she is taking otc vitamin D , we will recheck labs next visit   GERD: she went to [facility] 01/23 with severe burning sensation on esophagus and dysphagia. She was given GI cocktail and sent home on PPI. Symptoms are controlled with medications. She takes medication every other day and is controlling symptoms   Carpal tunnel and right wrist carpometacarpal OA: went back to Ortho was advised to use ice and alternate with heat, she is doing better since she completed PT and is doing home exercise and  change to ergonomic keyboard   Pre-diabetes: last A1C improved, discussed low carb diet, avoid fast food and exercise more   Vasomotor symptoms: she had a hysterectomy over 10 years ago, she has noticed night sweats and hot flashes over the past of couple months Discussed layers, and medications, she would like to hold off on that at this time   Patient Active Problem List   Diagnosis Date Noted   Prediabetes 10/03/2021   Obesity (BMI 30.0-34.9) 08/28/2021   Primary osteoarthritis of first carpometacarpal joint of right hand 08/28/2021   Carpal tunnel syndrome of right wrist 08/28/2021   GERD without esophagitis 05/15/2021   Cervical high risk HPV (human papillomavirus) test positive 07/30/2016   Bee sting allergy 07/28/2015   Dyslipidemia 11/02/2014   Migraine without aura and responsive to treatment 11/02/2014   Vitamin D deficiency 11/02/2014   Urinary incontinence in female 11/02/2014    Past Surgical History:  Procedure Laterality Date   ABDOMINAL HYSTERECTOMY     TUBAL LIGATION  2009    Family History  Problem Relation Age of Onset   Hypertension Mother    Hyperlipidemia Mother    Diabetes Father     Social History  Tobacco Use   Smoking status: Never   Smokeless tobacco: Never  Substance Use Topics   Alcohol use: Not Currently    Comment: occasionally     Current Outpatient Medications:    EPINEPHrine 0.3 mg/0.3 mL IJ SOAJ injection, , Disp: , Rfl:    Vitamin D, Ergocalciferol, (DRISDOL) 1.25 MG (50000 UNIT) CAPS capsule, TAKE 1 CAPSULE BY MOUTH EVERY 7 DAYS, Disp: 12 capsule, Rfl: 0   pantoprazole (PROTONIX) 20 MG tablet, Take 1 tablet (20 mg total) by mouth daily., Disp: 90 tablet, Rfl: 1  Allergies  Allergen Reactions   Tea Hives   Bee Venom     Bee    I personally reviewed active problem list, medication list, allergies, family history, social history, health maintenance with the patient/caregiver today.   ROS  Constitutional: Negative for  fever, positive for  weight change.  Respiratory: Negative for cough and shortness of breath.   Cardiovascular: Negative for chest pain or palpitations.  Gastrointestinal: Negative for abdominal pain, no bowel changes.  Musculoskeletal: Negative for gait problem or joint swelling.  Skin: Negative for rash.  Neurological: Negative for dizziness or headache.  No other specific complaints in a complete review of systems (except as listed in HPI above).   Objective  Vitals:   05/15/22 1524  BP: 128/78  Pulse: 92  Resp: 16  Temp: 98.3 F (36.8 C)  TempSrc: Oral  SpO2: 96%  Weight: 192 lb 11.2 oz (87.4 kg)  Height: 5\' 6"  (1.676 m)    Body mass index is 31.1 kg/m.  Physical Exam  Constitutional: Patient appears well-developed and well-nourished. Obese  No distress.  HEENT: head atraumatic, normocephalic, pupils equal and reactive to light, neck supple Cardiovascular: Normal rate, regular rhythm and normal heart sounds.  No murmur heard. No BLE edema. Pulmonary/Chest: Effort normal and breath sounds normal. No respiratory distress. Abdominal: Soft.  There is no tenderness. Psychiatric: Patient has a normal mood and affect. behavior is normal. Judgment and thought content normal.   Recent Results (from the past 2160 hour(s))  POCT urinalysis dipstick     Status: Abnormal   Collection Time: 03/12/22  1:06 PM  Result Value Ref Range   Color, UA gold    Clarity, UA cloudy    Glucose, UA Negative Negative   Bilirubin, UA negative    Ketones, UA negative    Spec Grav, UA 1.025 1.010 - 1.025   Blood, UA large    pH, UA 7.0 5.0 - 8.0   Protein, UA Positive (A) Negative   Urobilinogen, UA 0.2 0.2 or 1.0 E.U./dL   Nitrite, UA negative    Leukocytes, UA Small (1+) (A) Negative   Appearance cloudy    Odor none   Urine Culture     Status: None   Collection Time: 03/12/22  1:36 PM   Specimen: Urine  Result Value Ref Range   MICRO NUMBER: [id number]    SPECIMEN QUALITY: Adequate     Sample Source URINE    STATUS: FINAL    Result: No Growth     PHQ2/9:    05/15/2022    3:26 PM 03/12/2022    1:01 PM 11/13/2021    3:53 PM 10/03/2021    9:11 AM 08/28/2021    2:22 PM  Depression screen PHQ 2/9  Decreased Interest 0 0 0 0 0  Down, Depressed, Hopeless 0 0 0 0 0  PHQ - 2 Score 0 0 0 0 0  Altered sleeping 0  Tired, decreased energy 0      Change in appetite 0      Feeling bad or failure about yourself  0      Trouble concentrating 0      Moving slowly or fidgety/restless 0      Suicidal thoughts 0      PHQ-9 Score 0        phq 9 is negative   Fall Risk:    05/15/2022    3:26 PM 03/12/2022    1:01 PM 11/13/2021    3:53 PM 10/03/2021    9:10 AM 08/28/2021    2:22 PM  Fall Risk   Falls in the past year? 0 0 0 0 0  Number falls in past yr:  0  0 0  Injury with Fall?  0  0 0  Risk for fall due to : No Fall Risks  No Fall Risks  No Fall Risks  Follow up Falls evaluation completed;Falls prevention discussed;Education provided Falls evaluation completed Falls prevention discussed Falls evaluation completed Falls prevention discussed      Functional Status Survey: Is the patient deaf or have difficulty hearing?: No Does the patient have difficulty seeing, even when wearing glasses/contacts?: No Does the patient have difficulty concentrating, remembering, or making decisions?: No Does the patient have difficulty walking or climbing stairs?: No Does the patient have difficulty dressing or bathing?: No Does the patient have difficulty doing errands alone such as visiting a doctor's office or shopping?: No    Assessment & Plan  1. GERD without esophagitis  - pantoprazole (PROTONIX) 20 MG tablet; Take 1 tablet (20 mg total) by mouth daily.  Dispense: 90 tablet; Refill: 1  2. Obesity (BMI 30.0-34.9)  - Semaglutide-Weight Management 0.25 MG/0.5ML SOAJ; Inject 0.25 mg into the skin once a week for 28 days.  Dispense: 2 mL; Refill: 0 - Semaglutide-Weight  Management 0.5 MG/0.5ML SOAJ; Inject 0.5 mg into the skin once a week for 28 days.  Dispense: 2 mL; Refill: 0 - Semaglutide-Weight Management 1 MG/0.5ML SOAJ; Inject 1 mg into the skin once a week for 28 days.  Dispense: 2 mL; Refill: 0  Discussed myfitness   3. Prediabetes  Discussed life style modifications   4. Dyslipidemia  She states mother has dyslipidemia.   5. Vitamin D deficiency  - Vitamin D, Ergocalciferol, (DRISDOL) 1.25 MG (50000 UNIT) CAPS capsule; Take 1 capsule (50,000 Units total) by mouth every 7 (seven) days.  Dispense: 12 capsule; Refill: 1  6. Primary osteoarthritis of first carpometacarpal joint of right hand   Avoid oral nsaid's , may take prn tylenol, continue exercise and can use topical voltaren gel

## 2022-05-15 ENCOUNTER — Encounter: Payer: Self-pay | Admitting: Family Medicine

## 2022-05-15 ENCOUNTER — Ambulatory Visit (INDEPENDENT_AMBULATORY_CARE_PROVIDER_SITE_OTHER): Payer: Managed Care, Other (non HMO) | Admitting: Family Medicine

## 2022-05-15 VITALS — BP 128/78 | HR 92 | Temp 98.3°F | Resp 16 | Ht 66.0 in | Wt 192.7 lb

## 2022-05-15 DIAGNOSIS — K219 Gastro-esophageal reflux disease without esophagitis: Secondary | ICD-10-CM | POA: Diagnosis not present

## 2022-05-15 DIAGNOSIS — E785 Hyperlipidemia, unspecified: Secondary | ICD-10-CM | POA: Diagnosis not present

## 2022-05-15 DIAGNOSIS — E669 Obesity, unspecified: Secondary | ICD-10-CM

## 2022-05-15 DIAGNOSIS — R7303 Prediabetes: Secondary | ICD-10-CM | POA: Diagnosis not present

## 2022-05-15 DIAGNOSIS — M1811 Unilateral primary osteoarthritis of first carpometacarpal joint, right hand: Secondary | ICD-10-CM

## 2022-05-15 DIAGNOSIS — E559 Vitamin D deficiency, unspecified: Secondary | ICD-10-CM

## 2022-05-15 MED ORDER — SEMAGLUTIDE-WEIGHT MANAGEMENT 0.25 MG/0.5ML ~~LOC~~ SOAJ: 0.2500 mg | SUBCUTANEOUS | 0 refills | Status: AC

## 2022-05-15 MED ORDER — SEMAGLUTIDE-WEIGHT MANAGEMENT 1 MG/0.5ML ~~LOC~~ SOAJ: 1.0000 mg | SUBCUTANEOUS | 0 refills | Status: DC

## 2022-05-15 MED ORDER — SEMAGLUTIDE-WEIGHT MANAGEMENT 0.5 MG/0.5ML ~~LOC~~ SOAJ: 0.5000 mg | SUBCUTANEOUS | 0 refills | Status: AC

## 2022-05-15 MED ORDER — PANTOPRAZOLE SODIUM 20 MG PO TBEC: 20.0000 mg | DELAYED_RELEASE_TABLET | Freq: Every day | ORAL | 1 refills | Status: DC

## 2022-05-15 MED ORDER — DICLOFENAC SODIUM 1 % EX GEL: 2.0000 g | Freq: Four times a day (QID) | CUTANEOUS | 0 refills | Status: DC

## 2022-05-15 MED ORDER — VITAMIN D (ERGOCALCIFEROL) 1.25 MG (50000 UNIT) PO CAPS: 50000.0000 [IU] | ORAL_CAPSULE | ORAL | 1 refills | Status: DC

## 2022-05-17 ENCOUNTER — Encounter: Payer: Self-pay | Admitting: Family Medicine

## 2022-06-05 ENCOUNTER — Telehealth: Payer: Self-pay

## 2022-06-05 NOTE — Telephone Encounter (Signed)
Spoke with Pharmacy to determine status of Wegovy for this patient. They stated a PA needed to be completed before they could ship it out to the patient and would fax over paperwork this afternoon. Awaiting and will compete once received and inform the patient.

## 2022-07-30 ENCOUNTER — Other Ambulatory Visit: Payer: Self-pay | Admitting: Family Medicine

## 2022-07-30 DIAGNOSIS — E669 Obesity, unspecified: Secondary | ICD-10-CM

## 2022-08-01 MED ORDER — SEMAGLUTIDE-WEIGHT MANAGEMENT 1 MG/0.5ML ~~LOC~~ SOAJ: 1.0000 mg | SUBCUTANEOUS | 1 refills | Status: DC

## 2022-08-17 NOTE — Progress Notes (Unsigned)
Name: Tanya Robinson   MRN: [id number]    DOB: Sep 11, 1975   Date:08/20/2022       Progress Note  Subjective  Chief Complaint  Medication Refill  HPI  Obesity: she states her high school weight was 140 lbs. When she delivered her son ( 1999 ) she was 176 lb and maintaining the weight around 170 lbs for many years. Over the past few years weight has been trending up, staying in the 180 lbs and early 2023 it went up to 194 lbs. She was eating healthier meals but large portions. . She tried [place] and because of nation wide shortage we tried too give [place] but out of stock, she had lost she lost 8 lbs on the medication but unable to fill it, she is now driving [facility] to get rx of Wegovy filled. She is on 1 mg now, she has noticed increase in heartburn with higher dose. She is eating smaller portions and weight is down to 177 lbs. She wants to try going up on the dose, we will adjust pantoprazole to 20 mg BID and may take tums or pepcid prn. She is also going to the gym three days a week for one hour. Her goal weight is 165 lbs   Hyperlipidemia: mother takes cholesterol medication, father has diabetes, her LDL last year was 31 and in June 2023 was down to 173 discussed familial dyslipidemia. She does not want to start medications at this time, eating healthier She would like to recheck once more in the Summer and reassess   Vitamin D : she is taking rx vitamin D weekly   GERD: she went to [facility] 01/23 with severe burning sensation on esophagus and dysphagia. She was given GI cocktail and sent home on PPI. Symptoms was controlled with medications but now flaring with Wegovy if persists we will refer her to GI   Carpal tunnel and right wrist carpometacarpal OA: went back to Ortho was advised to use ice and alternate with heat, she is doing better since she completed PT and is doing home exercise and change to ergonomic keyboard Stable   Pre-diabetes: losing weight, going to the gym, we will recheck  levels during CPE  Vasomotor symptoms: she had a hysterectomy over 10 years ago, she has noticed night sweats and hot flashes over the past of couple months She states night sweats are present now . Discussed medications .  Patient Active Problem List   Diagnosis Date Noted   Prediabetes 10/03/2021   Obesity (BMI 30.0-34.9) 08/28/2021   Primary osteoarthritis of first carpometacarpal joint of right hand 08/28/2021   Carpal tunnel syndrome of right wrist 08/28/2021   GERD without esophagitis 05/15/2021   Cervical high risk HPV (human papillomavirus) test positive 07/30/2016   Bee sting allergy 07/28/2015   Dyslipidemia 11/02/2014   Migraine without aura and responsive to treatment 11/02/2014   Vitamin D deficiency 11/02/2014   Urinary incontinence in female 11/02/2014    Past Surgical History:  Procedure Laterality Date   ABDOMINAL HYSTERECTOMY     TUBAL LIGATION  2009    Family History  Problem Relation Age of Onset   Hypertension Mother    Hyperlipidemia Mother    Diabetes Father     Social History   Tobacco Use   Smoking status: Never   Smokeless tobacco: Never  Substance Use Topics   Alcohol use: Not Currently    Comment: occasionally     Current Outpatient Medications:  diclofenac Sodium (VOLTAREN) 1 % GEL, Apply 2 g topically 4 (four) times daily., Disp: 300 g, Rfl: 0   EPINEPHrine 0.3 mg/0.3 mL IJ SOAJ injection, , Disp: , Rfl:    pantoprazole (PROTONIX) 20 MG tablet, Take 1 tablet (20 mg total) by mouth daily., Disp: 90 tablet, Rfl: 1   Semaglutide-Weight Management 1 MG/0.5ML SOAJ, Inject 1 mg into the skin once a week., Disp: 2 mL, Rfl: 1   Vitamin D, Ergocalciferol, (DRISDOL) 1.25 MG (50000 UNIT) CAPS capsule, Take 1 capsule (50,000 Units total) by mouth every 7 (seven) days., Disp: 12 capsule, Rfl: 1  Allergies  Allergen Reactions   Tea Hives   Bee Venom     Bee    I personally reviewed active problem list, medication list, allergies, family  history, social history, health maintenance with the patient/caregiver today.   ROS  Ten systems reviewed and is negative except as mentioned in HPI    Objective  Vitals:   08/20/22 0836  BP: 122/76  Pulse: 82  Resp: 16  Weight: 177 lb (80.3 kg)  Height: 5\' 6"  (1.676 m)    Body mass index is 28.57 kg/m.  Physical Exam  Constitutional: Patient appears well-developed and well-nourished.  No distress.  HEENT: head atraumatic, normocephalic, pupils equal and reactive to light, neck supple Cardiovascular: Normal rate, regular rhythm and normal heart sounds.  No murmur heard. No BLE edema. Pulmonary/Chest: Effort normal and breath sounds normal. No respiratory distress. Abdominal: Soft.  There is no tenderness. Psychiatric: Patient has a normal mood and affect. behavior is normal. Judgment and thought content normal.    PHQ2/9:    08/20/2022    8:36 AM 05/15/2022    3:26 PM 03/12/2022    1:01 PM 11/13/2021    3:53 PM 10/03/2021    9:11 AM  Depression screen PHQ 2/9  Decreased Interest 0 0 0 0 0  Down, Depressed, Hopeless 0 0 0 0 0  PHQ - 2 Score 0 0 0 0 0  Altered sleeping 0 0     Tired, decreased energy 0 0     Change in appetite 0 0     Feeling bad or failure about yourself  0 0     Trouble concentrating 0 0     Moving slowly or fidgety/restless 0 0     Suicidal thoughts 0 0     PHQ-9 Score 0 0       phq 9 is negative   Fall Risk:    08/20/2022    8:35 AM 05/15/2022    3:26 PM 03/12/2022    1:01 PM 11/13/2021    3:53 PM 10/03/2021    9:10 AM  Fall Risk   Falls in the past year? 0 0 0 0 0  Number falls in past yr: 0  0  0  Injury with Fall? 0  0  0  Risk for fall due to : No Fall Risks No Fall Risks  No Fall Risks   Follow up Falls prevention discussed Falls evaluation completed;Falls prevention discussed;Education provided Falls evaluation completed Falls prevention discussed Falls evaluation completed      Functional Status Survey: Is the patient deaf or  have difficulty hearing?: No Does the patient have difficulty seeing, even when wearing glasses/contacts?: No Does the patient have difficulty concentrating, remembering, or making decisions?: No Does the patient have difficulty walking or climbing stairs?: No Does the patient have difficulty dressing or bathing?: No Does the patient have difficulty doing  errands alone such as visiting a doctor's office or shopping?: No    Assessment & Plan  1. Obesity (BMI 30.0-34.9)  - Semaglutide-Weight Management (WEGOVY) 1.7 MG/0.75ML SOAJ; Inject 1.7 mg into the skin once a week.  Dispense: 3 mL; Refill: 0  2. Vitamin D deficiency  Continue supplementation   3. GERD without esophagitis  - pantoprazole (PROTONIX) 20 MG tablet; Take 1 tablet (20 mg total) by mouth 2 (two) times daily.  Dispense: 180 tablet; Refill: 0  4. Prediabetes  Reviewed diabetic diet  5. Dyslipidemia  Recheck during CPE  6. Carpal tunnel syndrome of right wrist  Stable  7. Primary osteoarthritis of first carpometacarpal joint of right hand   8. Hot flashes

## 2022-08-20 ENCOUNTER — Ambulatory Visit (INDEPENDENT_AMBULATORY_CARE_PROVIDER_SITE_OTHER): Payer: Managed Care, Other (non HMO) | Admitting: Family Medicine

## 2022-08-20 ENCOUNTER — Encounter: Payer: Self-pay | Admitting: Family Medicine

## 2022-08-20 VITALS — BP 122/76 | HR 82 | Resp 16 | Ht 66.0 in | Wt 177.0 lb

## 2022-08-20 DIAGNOSIS — E785 Hyperlipidemia, unspecified: Secondary | ICD-10-CM

## 2022-08-20 DIAGNOSIS — E669 Obesity, unspecified: Secondary | ICD-10-CM | POA: Diagnosis not present

## 2022-08-20 DIAGNOSIS — K219 Gastro-esophageal reflux disease without esophagitis: Secondary | ICD-10-CM

## 2022-08-20 DIAGNOSIS — E66811 Obesity, class 1: Secondary | ICD-10-CM

## 2022-08-20 DIAGNOSIS — G5601 Carpal tunnel syndrome, right upper limb: Secondary | ICD-10-CM

## 2022-08-20 DIAGNOSIS — E559 Vitamin D deficiency, unspecified: Secondary | ICD-10-CM | POA: Diagnosis not present

## 2022-08-20 DIAGNOSIS — R7303 Prediabetes: Secondary | ICD-10-CM

## 2022-08-20 DIAGNOSIS — R232 Flushing: Secondary | ICD-10-CM

## 2022-08-20 DIAGNOSIS — M1811 Unilateral primary osteoarthritis of first carpometacarpal joint, right hand: Secondary | ICD-10-CM

## 2022-08-20 MED ORDER — WEGOVY 1.7 MG/0.75ML ~~LOC~~ SOAJ: 1.7000 mg | SUBCUTANEOUS | 0 refills | Status: DC

## 2022-08-20 MED ORDER — PANTOPRAZOLE SODIUM 20 MG PO TBEC: 20.0000 mg | DELAYED_RELEASE_TABLET | Freq: Two times a day (BID) | ORAL | 0 refills | Status: AC

## 2022-08-27 ENCOUNTER — Encounter: Payer: Self-pay | Admitting: Family Medicine

## 2022-08-28 NOTE — Progress Notes (Unsigned)
Name: Tanya Robinson   MRN: [id number]    DOB: 1975-05-26   Date:08/29/2022       Progress Note  Subjective  Chief Complaint  ER Follow-Up  HPI  Nausea and vomiting: she started on Wegovy since Feb 2024. She states lower doses were okay, however when she went up to 3 rd dose of 1.7 mg she noticed nausea, vomiting and diarrhea. She took 3 rd shot last week and symptoms were much worse, she noticed black stools and went to [facility] , labs were normal including stool cultures. She was advised to drink fluids. She states she is due for the 4 th shot today but woke up and had two watery stools already.   Possibly reaction to [facility], she has some zofran at home but no longer having nausea or vomiting.  Currently just some diarrhea that is improving. Advised to hold [facility] and monitor symptoms, may resume Wegovy next week if asymptomatic but at lower dose and monitor. Explained it may also have been a viral gastroenteritis .   No blood in stools.   She will contact me for referral to GI if no resolution of symptoms in the next week    Patient Active Problem List   Diagnosis Date Noted   Prediabetes 10/03/2021   Obesity (BMI 30.0-34.9) 08/28/2021   Primary osteoarthritis of first carpometacarpal joint of right hand 08/28/2021   Carpal tunnel syndrome of right wrist 08/28/2021   GERD without esophagitis 05/15/2021   Cervical high risk HPV (human papillomavirus) test positive 07/30/2016   Bee sting allergy 07/28/2015   Dyslipidemia 11/02/2014   Migraine without aura and responsive to treatment 11/02/2014   Vitamin D deficiency 11/02/2014   Urinary incontinence in female 11/02/2014    Past Surgical History:  Procedure Laterality Date   ABDOMINAL HYSTERECTOMY     TUBAL LIGATION  2009    Family History  Problem Relation Age of Onset   Hypertension Mother    Hyperlipidemia Mother    Diabetes Father     Social History   Tobacco Use   Smoking status: Never   Smokeless tobacco: Never   Substance Use Topics   Alcohol use: Not Currently    Comment: occasionally     Current Outpatient Medications:    diclofenac Sodium (VOLTAREN) 1 % GEL, Apply 2 g topically 4 (four) times daily., Disp: 300 g, Rfl: 0   EPINEPHrine 0.3 mg/0.3 mL IJ SOAJ injection, , Disp: , Rfl:    pantoprazole (PROTONIX) 20 MG tablet, Take 1 tablet (20 mg total) by mouth 2 (two) times daily., Disp: 180 tablet, Rfl: 0   Semaglutide-Weight Management (WEGOVY) 1 MG/0.5ML SOAJ, Inject 1 mg into the skin once a week., Disp: 2 mL, Rfl: 0   Vitamin D, Ergocalciferol, (DRISDOL) 1.25 MG (50000 UNIT) CAPS capsule, Take 1 capsule (50,000 Units total) by mouth every 7 (seven) days., Disp: 12 capsule, Rfl: 1  Allergies  Allergen Reactions   Tea Hives   Bee Venom     Bee    I personally reviewed active problem list, medication list, allergies, family history, social history, health maintenance with the patient/caregiver today.   ROS  Ten systems reviewed and is negative except as mentioned in HPI   Objective  Vitals:   08/29/22 0829  BP: 122/72  Pulse: 90  Resp: 16  Temp: 98.2 F (36.8 C)  TempSrc: Oral  SpO2: 99%  Weight: 175 lb 4.8 oz (79.5 kg)  Height: 5\' 6"  (1.676 m)  Body mass index is 28.29 kg/m.  Physical Exam  Constitutional: Patient appears well-developed and well-nourished.  No distress.  HEENT: head atraumatic, normocephalic, pupils equal and reactive to light, neck supple Cardiovascular: Normal rate, regular rhythm and normal heart sounds.  No murmur heard. No BLE edema. Pulmonary/Chest: Effort normal and breath sounds normal. No respiratory distress. Abdominal: Soft.  There is no tenderness. Psychiatric: Patient has a normal mood and affect. behavior is normal. Judgment and thought content normal.   PHQ2/9:    08/20/2022    8:36 AM 05/15/2022    3:26 PM 03/12/2022    1:01 PM 11/13/2021    3:53 PM 10/03/2021    9:11 AM  Depression screen PHQ 2/9  Decreased Interest 0 0 0 0 0   Down, Depressed, Hopeless 0 0 0 0 0  PHQ - 2 Score 0 0 0 0 0  Altered sleeping 0 0     Tired, decreased energy 0 0     Change in appetite 0 0     Feeling bad or failure about yourself  0 0     Trouble concentrating 0 0     Moving slowly or fidgety/restless 0 0     Suicidal thoughts 0 0     PHQ-9 Score 0 0       phq 9 is negative   Fall Risk:    08/29/2022    8:31 AM 08/20/2022    8:35 AM 05/15/2022    3:26 PM 03/12/2022    1:01 PM 11/13/2021    3:53 PM  Fall Risk   Falls in the past year? 0 0 0 0 0  Number falls in past yr:  0  0   Injury with Fall?  0  0   Risk for fall due to : No Fall Risks No Fall Risks No Fall Risks  No Fall Risks  Follow up Falls prevention discussed Falls prevention discussed Falls evaluation completed;Falls prevention discussed;Education provided Falls evaluation completed Falls prevention discussed      Functional Status Survey: Is the patient deaf or have difficulty hearing?: No Does the patient have difficulty seeing, even when wearing glasses/contacts?: No Does the patient have difficulty concentrating, remembering, or making decisions?: No Does the patient have difficulty walking or climbing stairs?: No Does the patient have difficulty dressing or bathing?: No Does the patient have difficulty doing errands alone such as visiting a doctor's office or shopping?: No    Assessment & Plan  1. Nausea vomiting and diarrhea  She has medication at home  2. Obesity (BMI 30.0-34.9)  - Semaglutide-Weight Management (WEGOVY) 1 MG/0.5ML SOAJ; Inject 1 mg into the skin once a week.  Dispense: 2 mL; Refill: 0

## 2022-08-29 ENCOUNTER — Encounter: Payer: Self-pay | Admitting: Family Medicine

## 2022-08-29 ENCOUNTER — Ambulatory Visit: Payer: Managed Care, Other (non HMO) | Admitting: Family Medicine

## 2022-08-29 VITALS — BP 122/72 | HR 90 | Temp 98.2°F | Resp 16 | Ht 66.0 in | Wt 175.3 lb

## 2022-08-29 DIAGNOSIS — R197 Diarrhea, unspecified: Secondary | ICD-10-CM | POA: Diagnosis not present

## 2022-08-29 DIAGNOSIS — E669 Obesity, unspecified: Secondary | ICD-10-CM

## 2022-08-29 DIAGNOSIS — R112 Nausea with vomiting, unspecified: Secondary | ICD-10-CM

## 2022-08-29 MED ORDER — WEGOVY 1 MG/0.5ML ~~LOC~~ SOAJ
1.0000 mg | SUBCUTANEOUS | 0 refills | Status: DC
Start: 2022-08-29 — End: 2022-09-04

## 2022-09-04 ENCOUNTER — Other Ambulatory Visit: Payer: Self-pay | Admitting: Family Medicine

## 2022-09-04 ENCOUNTER — Encounter: Payer: Self-pay | Admitting: Family Medicine

## 2022-09-04 DIAGNOSIS — E66811 Obesity, class 1: Secondary | ICD-10-CM

## 2022-09-04 DIAGNOSIS — E669 Obesity, unspecified: Secondary | ICD-10-CM

## 2022-09-04 DIAGNOSIS — R197 Diarrhea, unspecified: Secondary | ICD-10-CM

## 2022-09-04 MED ORDER — WEGOVY 0.5 MG/0.5ML ~~LOC~~ SOAJ: 0.5000 mg | SUBCUTANEOUS | 0 refills | Status: DC

## 2022-09-12 ENCOUNTER — Encounter: Payer: Self-pay | Admitting: Family Medicine

## 2022-09-13 NOTE — Progress Notes (Signed)
Tanya Amy, PA-C [address]  [address]  [place], [place] [address]  Main: [phone]  Fax: [phone]   Gastroenterology Consultation  Referring Provider:     Alba Cory, MD Primary Care Physician:  Tanya Cory, MD Primary Gastroenterologist:  Tanya Amy, PA-C / Dr. Wyline Robinson  Reason for Consultation:     Diarrhea        HPI:   Tanya Robinson is a 47 y.o. y/o female referred for consultation & management  by Tanya Cory, MD.    08/26/22 Tanya Robinson to ED [facility] for Nausea, Vomiting, Diarrhea, Abd cramping x 1 week.  Started after she started taking Wegovy.  Given IV Fluids and Zofran.  Labs: Normal CBC, CMP, Lipase.  Cr 1.03, BUN 7, GFR 67, Potassium 3.7, Hgb 14.1, WBC 6.6.  Stool Studies Negative for C. Difficile and GI Pathogen Panal.  08/29/22 - F/U OV with PCP Dr. Carlynn Robinson.  Started [facility] Feb. 2024 - Higher dose (1.7mg ) was causing Nausea, vomiting, and diarrhea.  Was advised to stop Wegovy.  Took Zofran prn nausea.  Denied rectal bleeding.  Prior Hysterectomy and BTL.  No other abdominal surgeries.  No recent abdominal imaging.    Current Symptoms: She stopped taking Wegovy 3 weeks ago and is feeling 100% better.  She has no more nausea, vomiting, or diarrhea.  Denies rectal bleeding.  No current GI symptoms.    She reports attempting a colonoscopy a few years ago.  She vomited TriLyte prep and could not complete the procedure.  Last year she had a negative Cologuard test through her PCP.  She is willing to attempt another colonoscopy with a pill prep.  No family history of colon cancer.  Past Medical History:  Diagnosis Date   Anxiety    Dyslipidemia    GERD (gastroesophageal reflux disease)    Headache     Past Surgical History:  Procedure Laterality Date   ABDOMINAL HYSTERECTOMY     TUBAL LIGATION  2009    Prior to Admission medications   Medication Sig Start Date End Date Taking? Authorizing Provider  diclofenac Sodium (VOLTAREN) 1 %  GEL Apply 2 g topically 4 (four) times daily. 05/15/22   Tanya Cory, MD  EPINEPHrine 0.3 mg/0.3 mL IJ SOAJ injection  08/03/19   [provider]  pantoprazole (PROTONIX) 20 MG tablet Take 1 tablet (20 mg total) by mouth 2 (two) times daily. 08/20/22 02/16/23  Tanya Cory, MD         Vitamin D, Ergocalciferol, (DRISDOL) 1.25 MG (50000 UNIT) CAPS capsule Take 1 capsule (50,000 Units total) by mouth every 7 (seven) days. 05/15/22   Tanya Cory, MD    Family History  Problem Relation Age of Onset   Hypertension Mother    Hyperlipidemia Mother    Diabetes Father      Social History   Tobacco Use   Smoking status: Never   Smokeless tobacco: Never  Vaping Use   Vaping Use: Never used  Substance Use Topics   Alcohol use: Not Currently    Comment: occasionally   Drug use: No    Allergies as of 09/14/2022 - Review Complete 09/14/2022  Allergen Reaction Noted   Tea Hives 05/11/2015   Bee venom  11/02/2014    Review of Systems:    All systems reviewed and negative except where noted in HPI.   Physical Exam:  BP 116/78   Pulse 87   Temp 98.4 F (36.9 C)   Ht 5'  6" (1.676 m)   Wt 171 lb 9.6 oz (77.8 kg)   BMI 27.70 kg/m  No LMP recorded. Patient has had a hysterectomy. Psych:  Alert and cooperative. Normal Robinson and affect. General:   Alert,  Well-developed, well-nourished, pleasant and cooperative in NAD Head:  Normocephalic and atraumatic. Eyes:  Sclera clear, no icterus.   Conjunctiva pink. Neck:  Supple; no masses or thyromegaly. Lungs:  Respirations even and unlabored.  Clear throughout to auscultation.   No wheezes, crackles, or rhonchi. No acute distress. Heart:  Regular rate and rhythm; no murmurs, clicks, rubs, or gallops. Abdomen:  Normal bowel sounds.  No bruits.  Soft, and non-distended without masses, hepatosplenomegaly or hernias noted.  No Tenderness.  No guarding or rebound tenderness.    Neurologic:  Alert and oriented x3;  grossly normal  neurologically. Psych:  Alert and cooperative. Normal Robinson and affect.  Imaging Studies: No results found.  Assessment and Plan:   Tanya Robinson is a 47 y.o. y/o female has been referred for nausea, vomiting and diarrhea.  This was most likely adverse side effects of taking Wegovy.  Since [facility] was discontinued 3 weeks ago, she has had no more nausea, vomiting, or diarrhea.  No current GI symptoms. Recent Normal CBC, CMP & Lipase Labs.  Recent Negative GI Pathogen Panal and C. Diff PCR. She is age 47 and due for first screening colonoscopy.  She states that she attempted to do a colonoscopy prep a few years ago, however she was not able to tolerate the liquid prep which caused nausea.  She is willing to attempt another screening colonoscopy and is requesting pill prep.  Nausea / Vomiting - Resolved off Wegovy.  Remain off Wegovy.  Diarrhea - Resolved off Wegovy. Negative Stool Studies (C. Diff & GI Pathogen Panal)  Colon Cancer Screening  Scheduling Colonoscopy I discussed risks of colonoscopy with patient to include risk of bleeding, colon perforation, and risk of sedation.  Patient expressed understanding and agrees to proceed with colonoscopy.  *Gave SuTab Pill Prep per pt. Request.   Follow up in prn based on Colonoscopy results and GI symptoms.  Tanya Amy, PA-C

## 2022-09-14 ENCOUNTER — Ambulatory Visit (INDEPENDENT_AMBULATORY_CARE_PROVIDER_SITE_OTHER): Payer: Managed Care, Other (non HMO) | Admitting: Physician Assistant

## 2022-09-14 ENCOUNTER — Encounter: Payer: Self-pay | Admitting: Physician Assistant

## 2022-09-14 ENCOUNTER — Other Ambulatory Visit: Payer: Self-pay

## 2022-09-14 VITALS — BP 116/78 | HR 87 | Temp 98.4°F | Ht 66.0 in | Wt 171.6 lb

## 2022-09-14 DIAGNOSIS — R197 Diarrhea, unspecified: Secondary | ICD-10-CM

## 2022-09-14 DIAGNOSIS — R112 Nausea with vomiting, unspecified: Secondary | ICD-10-CM | POA: Diagnosis not present

## 2022-09-14 DIAGNOSIS — Z1211 Encounter for screening for malignant neoplasm of colon: Secondary | ICD-10-CM

## 2022-09-14 MED ORDER — SUTAB 1479-225-188 MG PO TABS: 1.0000 | ORAL_TABLET | Freq: Two times a day (BID) | ORAL | 0 refills | Status: DC

## 2022-10-05 NOTE — Progress Notes (Unsigned)
Name: IZABELL KOSTY   MRN: [id number]    DOB: 1975-12-17   Date:10/08/2022       Progress Note  Subjective  Chief Complaint  Annual Exam  HPI  Patient presents for annual CPE.  Diet: eating better, no longer drinking sodas - stopped early 2024, also not drinking a lot of orange juice , using air fryer, more fruit and vegetables  Exercise: going to the gym 3 days a week for 1 hour   Last Eye Exam: up to date  Last Dental Exam: up to date   [employer] Visit from 05/15/2022 in [facility]  AUDIT-C Score 1      Depression: Phq 9 is  negative    10/08/2022    3:12 PM 08/20/2022    8:36 AM 05/15/2022    3:26 PM 03/12/2022    1:01 PM 11/13/2021    3:53 PM  Depression screen PHQ 2/9  Decreased Interest 0 0 0 0 0  Down, Depressed, Hopeless 0 0 0 0 0  PHQ - 2 Score 0 0 0 0 0  Altered sleeping 0 0 0    Tired, decreased energy 0 0 0    Change in appetite 0 0 0    Feeling bad or failure about yourself  0 0 0    Trouble concentrating 0 0 0    Moving slowly or fidgety/restless 0 0 0    Suicidal thoughts 0 0 0    PHQ-9 Score 0 0 0     Hypertension: BP Readings from Last 3 Encounters:  10/08/22 116/72  09/14/22 116/78  08/29/22 122/72   Obesity: Wt Readings from Last 3 Encounters:  10/08/22 171 lb 1.6 oz (77.6 kg)  09/14/22 171 lb 9.6 oz (77.8 kg)  08/29/22 175 lb 4.8 oz (79.5 kg)   BMI Readings from Last 3 Encounters:  10/08/22 27.62 kg/m  09/14/22 27.70 kg/m  08/29/22 28.29 kg/m     Vaccines:   HPV: N/A Tdap: up to date Shingrix: start at age 42  Pneumonia: N/A  Flu: up to date COVID-19: up to date   Hep C Screening: 12/25/17 STD testing and prevention (HIV/chl/gon/syphilis): 12/25/17 Intimate partner violence: negative screen  Sexual History : no desire, denies pain. Menstrual History/LMP/Abnormal Bleeding: no cycles - hysterectomy  Discussed importance of follow up if any post-menopausal bleeding: yes  Incontinence Symptoms:  negative for symptoms   Breast cancer:  - Last Mammogram: 09/15/21, due - BRCA gene screening: N/A  Osteoporosis Prevention : Discussed high calcium and vitamin D supplementation, weight bearing exercises Bone density: N/A   Cervical cancer screening: 10/03/21 - hysterectomy supra cervical   Skin cancer: Discussed monitoring for atypical lesions  Colorectal cancer: she had a cologuard in 09/18/21  , she is seeing GI for diarrhea and will  have a colonoscopy now  Lung cancer:  Low Dose CT Chest recommended if Age 67-80 years, 20 pack-year currently smoking OR have quit w/in 15years. Patient does not qualify for screen   ECG: 05/03/21  Advanced Care Planning: A voluntary discussion about advance care planning including the explanation and discussion of advance directives.  Discussed health care proxy and Living will, and the patient was able to identify a health care proxy as husband .  Patient has  a living will and power of attorney of health care   Lipids: Lab Results  Component Value Date   CHOL 253 (H) 10/03/2021   CHOL 263 (H) 03/06/2021   CHOL  271 (H) 08/03/2019   Lab Results  Component Value Date   HDL 59 10/03/2021   HDL 64 03/06/2021   HDL 59 08/03/2019   Lab Results  Component Value Date   LDLCALC 173 (H) 10/03/2021   LDLCALC 184 (H) 03/06/2021   LDLCALC 191 (H) 08/03/2019   Lab Results  Component Value Date   TRIG 91 10/03/2021   TRIG 89 03/06/2021   TRIG 96 08/03/2019   Lab Results  Component Value Date   CHOLHDL 4.3 10/03/2021   CHOLHDL 4.1 03/06/2021   CHOLHDL 4.6 08/03/2019   No results found for: "LDLDIRECT"  Glucose: Glucose  Date Value Ref Range Status  03/06/2021 93 70 - 99 mg/dL Final  16/01/9603 540 (H) 65 - 99 mg/dL Final   Glucose, Bld  Date Value Ref Range Status  10/03/2021 90 65 - 99 mg/dL Final    Comment:    .            Fasting reference interval .   05/01/2021 117 (H) 70 - 99 mg/dL Final    Comment:    Glucose reference range  applies only to samples taken after fasting for at least 8 hours.  08/03/2019 85 65 - 99 mg/dL Final    Comment:    .            Fasting reference interval .     Patient Active Problem List   Diagnosis Date Noted   Prediabetes 10/03/2021   Obesity (BMI 30.0-34.9) 08/28/2021   Primary osteoarthritis of first carpometacarpal joint of right hand 08/28/2021   Carpal tunnel syndrome of right wrist 08/28/2021   GERD without esophagitis 05/15/2021   Cervical high risk HPV (human papillomavirus) test positive 07/30/2016   Bee sting allergy 07/28/2015   Dyslipidemia 11/02/2014   Migraine without aura and responsive to treatment 11/02/2014   Vitamin D deficiency 11/02/2014   Urinary incontinence in female 11/02/2014    Past Surgical History:  Procedure Laterality Date   ABDOMINAL HYSTERECTOMY     TUBAL LIGATION  2009    Family History  Problem Relation Age of Onset   Hypertension Mother    Hyperlipidemia Mother    Diabetes Father     Social History   Socioeconomic History   Marital status: Married    Spouse name: [place]    Number of children: 2   Years of education: Not on file   Highest education level: High school graduate  Occupational History   Occupation: Financial controller: [employer]  Tobacco Use   Smoking status: Never   Smokeless tobacco: Never  Vaping Use   Vaping Use: Never used  Substance and Sexual Activity   Alcohol use: Not Currently    Comment: occasionally   Drug use: No   Sexual activity: Yes    Partners: Male    Birth control/protection: Surgical  Other Topics Concern   Not on file  Social History Narrative   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 She started at [employer] April 2019, she has been working from home since March 2020 because of pandemic    She has been living with boyfriend ( together since 2011)   He was previously married, and he has twins from previously relationship.    Her two children, are  grown now. A son and a daughter.    Daughter had a miscarriage in 2021   Social Determinants of Health   Financial Resource Strain:  Low Risk  (10/03/2021)   Overall Financial Resource Strain (CARDIA)    Difficulty of Paying Living Expenses: Not hard at all  Food Insecurity: No Food Insecurity (10/03/2021)   Hunger Vital Sign    Worried About Running Out of Food in the Last Year: Never true    Ran Out of Food in the Last Year: Never true  Transportation Needs: No Transportation Needs (10/03/2021)   PRAPARE - Administrator, Civil Service (Medical): No    Lack of Transportation (Non-Medical): No  Physical Activity: Sufficiently Active (10/08/2022)   Exercise Vital Sign    Days of Exercise per Week: 3 days    Minutes of Exercise per Session: 60 min  Stress: No Stress Concern Present (10/03/2021)   [employer] of Occupational Health - Occupational Stress Questionnaire    Feeling of Stress : Not at all  Social Connections: Moderately Integrated (10/03/2021)   Social Connection and Isolation Panel [NHANES]    Frequency of Communication with Friends and Family: More than three times a week    Frequency of Social Gatherings with Friends and Family: Three times a week    Attends Religious Services: 1 to 4 times per year    Active Member of Clubs or Organizations: No    Attends Banker Meetings: Never    Marital Status: Married  Catering manager Violence: Not At Risk (10/03/2021)   Humiliation, Afraid, Rape, and Kick questionnaire    Fear of Current or Ex-Partner: No    Emotionally Abused: No    Physically Abused: No    Sexually Abused: No     Current Outpatient Medications:    EPINEPHrine 0.3 mg/0.3 mL IJ SOAJ injection, , Disp: , Rfl:    pantoprazole (PROTONIX) 20 MG tablet, Take 1 tablet (20 mg total) by mouth 2 (two) times daily., Disp: 180 tablet, Rfl: 0   Semaglutide-Weight Management (WEGOVY) 0.5 MG/0.5ML SOAJ, Inject 0.5 mg into the skin once a week.,  Disp: 2 mL, Rfl: 0   Sodium Sulfate-Mag Sulfate-KCl (SUTAB) [phone] MG TABS, Take 1 tablet by mouth in the morning and at bedtime. Patient has instructions., Disp: 24 tablet, Rfl: 0   Vitamin D, Ergocalciferol, (DRISDOL) 1.25 MG (50000 UNIT) CAPS capsule, Take 1 capsule (50,000 Units total) by mouth every 7 (seven) days., Disp: 12 capsule, Rfl: 1  Allergies  Allergen Reactions   Tea Hives   Bee Venom     Bee     ROS  Ten systems reviewed and is negative except as mentioned in HPI   Objective  Vitals:   10/08/22 1511  BP: 116/72  Pulse: 78  Resp: 14  Temp: 97.8 F (36.6 C)  TempSrc: Oral  SpO2: 100%  Weight: 171 lb 1.6 oz (77.6 kg)  Height: 5\' 6"  (1.676 m)    Body mass index is 27.62 kg/m.  Physical Exam  Constitutional: Patient appears well-developed and well-nourished. No distress.  HENT: Head: Normocephalic and atraumatic. Ears: B TMs ok, no erythema or effusion; Nose: Nose normal. Mouth/Throat: Oropharynx is clear and moist. No oropharyngeal exudate.  Eyes: Conjunctivae and EOM are normal. Pupils are equal, round, and reactive to light. No scleral icterus.  Neck: Normal range of motion. Neck supple. No JVD present. No thyromegaly present.  Cardiovascular: Normal rate, regular rhythm and normal heart sounds.  No murmur heard. No BLE edema. Pulmonary/Chest: Effort normal and breath sounds normal. No respiratory distress. Abdominal: Soft. Bowel sounds are normal, no distension. There is no tenderness. no masses  Breast: no lumps or masses, no nipple discharge or rashes FEMALE GENITALIA:  Not done  RECTAL: not done  Musculoskeletal: Normal range of motion, no joint effusions. No gross deformities Neurological: he is alert and oriented to person, place, and time. No cranial nerve deficit. Coordination, balance, strength, speech and gait are normal.  Skin: Skin is warm and dry. No rash noted. No erythema.  Psychiatric: Patient has a normal mood and affect. behavior  is normal. Judgment and thought content normal.    Fall Risk:    10/08/2022    3:12 PM 08/29/2022    8:31 AM 08/20/2022    8:35 AM 05/15/2022    3:26 PM 03/12/2022    1:01 PM  Fall Risk   Falls in the past year? 0 0 0 0 0  Number falls in past yr:   0  0  Injury with Fall?   0  0  Risk for fall due to :  No Fall Risks No Fall Risks No Fall Risks   Follow up Falls prevention discussed;Education provided;Falls evaluation completed Falls prevention discussed Falls prevention discussed Falls evaluation completed;Falls prevention discussed;Education provided Falls evaluation completed      Assessment & Plan  1. Well adult exam  Doing well now, diarrhea resolved once she went down on dose of Wegovy,  but going to have colonoscopy done  - Lipid panel - CBC with Differential/Platelet - COMPLETE METABOLIC PANEL WITH GFR - Hemoglobin A1c  2. Breast cancer screening by mammogram  - MM 3D SCREENING MAMMOGRAM BILATERAL BREAST; Future    3. Vitamin D deficiency  - VITAMIN D 25 Hydroxy (Vit-D Deficiency, Fractures)  4. Prediabetes  - Hemoglobin A1c  5. Dyslipidemia  - Lipid panel    -USPSTF grade A and B recommendations reviewed with patient; age-appropriate recommendations, preventive care, screening tests, etc discussed and encouraged; healthy living encouraged; see AVS for patient education given to patient -Discussed importance of 150 minutes of physical activity weekly, eat two servings of fish weekly, eat one serving of tree nuts ( cashews, pistachios, pecans, almonds.Marland Kitchen) every other day, eat 6 servings of fruit/vegetables daily and drink plenty of water and avoid sweet beverages.   -Reviewed Health Maintenance: Yes.

## 2022-10-08 ENCOUNTER — Encounter: Payer: Self-pay | Admitting: Family Medicine

## 2022-10-08 ENCOUNTER — Ambulatory Visit (INDEPENDENT_AMBULATORY_CARE_PROVIDER_SITE_OTHER): Payer: Managed Care, Other (non HMO) | Admitting: Family Medicine

## 2022-10-08 VITALS — BP 116/72 | HR 78 | Temp 97.8°F | Resp 14 | Ht 66.0 in | Wt 171.1 lb

## 2022-10-08 DIAGNOSIS — Z1231 Encounter for screening mammogram for malignant neoplasm of breast: Secondary | ICD-10-CM

## 2022-10-08 DIAGNOSIS — Z0000 Encounter for general adult medical examination without abnormal findings: Secondary | ICD-10-CM

## 2022-10-08 DIAGNOSIS — R7303 Prediabetes: Secondary | ICD-10-CM

## 2022-10-08 DIAGNOSIS — E559 Vitamin D deficiency, unspecified: Secondary | ICD-10-CM

## 2022-10-08 DIAGNOSIS — E785 Hyperlipidemia, unspecified: Secondary | ICD-10-CM

## 2022-10-08 LAB — CBC WITH DIFFERENTIAL/PLATELET
Basophils Relative: 1.4 %
Lymphs Abs: 2208 cells/uL (ref 850–3900)
MCH: 29.2 pg (ref 27.0–33.0)
Monocytes Relative: 8 %

## 2022-10-09 ENCOUNTER — Other Ambulatory Visit: Payer: Self-pay | Admitting: Family Medicine

## 2022-10-09 ENCOUNTER — Encounter: Payer: Self-pay | Admitting: Family Medicine

## 2022-10-09 LAB — CBC WITH DIFFERENTIAL/PLATELET
Absolute Monocytes: 512 cells/uL (ref 200–950)
Basophils Absolute: 90 cells/uL (ref 0–200)
Eosinophils Absolute: 122 cells/uL (ref 15–500)
Eosinophils Relative: 1.9 %
HCT: 41.6 % (ref 35.0–45.0)
Hemoglobin: 13.6 g/dL (ref 11.7–15.5)
MCHC: 32.7 g/dL (ref 32.0–36.0)
MCV: 89.5 fL (ref 80.0–100.0)
MPV: 8.8 fL (ref 7.5–12.5)
Neutro Abs: 3469 cells/uL (ref 1500–7800)
Neutrophils Relative %: 54.2 %
Platelets: 367 10*3/uL (ref 140–400)
RBC: 4.65 10*6/uL (ref 3.80–5.10)
RDW: 13.8 % (ref 11.0–15.0)
Total Lymphocyte: 34.5 %
WBC: 6.4 10*3/uL (ref 3.8–10.8)

## 2022-10-09 LAB — COMPLETE METABOLIC PANEL WITH GFR
AG Ratio: 1.8 (calc) (ref 1.0–2.5)
ALT: 8 U/L (ref 6–29)
AST: 11 U/L (ref 10–35)
Albumin: 4.5 g/dL (ref 3.6–5.1)
Alkaline phosphatase (APISO): 47 U/L (ref 31–125)
BUN/Creatinine Ratio: 7 (calc) (ref 6–22)
BUN: 6 mg/dL — ABNORMAL LOW (ref 7–25)
CO2: 30 mmol/L (ref 20–32)
Calcium: 9.9 mg/dL (ref 8.6–10.2)
Chloride: 103 mmol/L (ref 98–110)
Creat: 0.89 mg/dL (ref 0.50–0.99)
Globulin: 2.5 g/dL (calc) (ref 1.9–3.7)
Glucose, Bld: 85 mg/dL (ref 65–99)
Potassium: 4.4 mmol/L (ref 3.5–5.3)
Sodium: 139 mmol/L (ref 135–146)
Total Bilirubin: 0.3 mg/dL (ref 0.2–1.2)
Total Protein: 7 g/dL (ref 6.1–8.1)
eGFR: 79 mL/min/{1.73_m2} (ref >=60)

## 2022-10-09 LAB — LIPID PANEL
Cholesterol: 252 mg/dL — ABNORMAL HIGH (ref <200)
HDL: 59 mg/dL (ref >=50)
LDL Cholesterol (Calc): 171 mg/dL (calc) — ABNORMAL HIGH
Non-HDL Cholesterol (Calc): 193 mg/dL (calc) — ABNORMAL HIGH (ref <130)
Total CHOL/HDL Ratio: 4.3 (calc) (ref <5.0)
Triglycerides: 98 mg/dL (ref <150)

## 2022-10-09 LAB — VITAMIN D 25 HYDROXY (VIT D DEFICIENCY, FRACTURES): Vit D, 25-Hydroxy: 50 ng/mL (ref 30–100)

## 2022-10-09 LAB — HEMOGLOBIN A1C
Hgb A1c MFr Bld: 5.4 % of total Hgb (ref <5.7)
Mean Plasma Glucose: 108 mg/dL
eAG (mmol/L): 6 mmol/L

## 2022-10-09 MED ORDER — ROSUVASTATIN CALCIUM 10 MG PO TABS
10.0000 mg | ORAL_TABLET | Freq: Every day | ORAL | 3 refills | Status: DC
Start: 2022-10-09 — End: 2023-11-08

## 2022-11-01 ENCOUNTER — Other Ambulatory Visit: Payer: Self-pay | Admitting: Family Medicine

## 2022-11-02 ENCOUNTER — Other Ambulatory Visit: Payer: Self-pay

## 2022-11-02 ENCOUNTER — Other Ambulatory Visit (HOSPITAL_COMMUNITY): Payer: Self-pay

## 2022-11-02 MED ORDER — WEGOVY 0.5 MG/0.5ML ~~LOC~~ SOAJ
0.5000 mg | SUBCUTANEOUS | 0 refills | Status: DC
  Filled 2022-11-02: qty 2, fill #0

## 2022-11-05 ENCOUNTER — Telehealth: Payer: Self-pay

## 2022-11-05 NOTE — Telephone Encounter (Signed)
Patient left a voicemail on the main line because she needs to cancel her colonoscopy that is schedule for 11/15/2022. She states she has other appointments that she has to do and is going on a cruise. She does not know when she will be able to reschedule the procedure.

## 2022-11-05 NOTE — Telephone Encounter (Signed)
  Left message on patients VM to see if she wanted to reschedule.  I spoke with Trish and cancelled procedure..   Patient left a voicemail on the main line because she needs to cancel her colonoscopy that is schedule for 11/15/2022. She states she has other appointments that she has to do and is going on a cruise. She does not know when she will be able to reschedule the procedure.

## 2022-11-06 ENCOUNTER — Other Ambulatory Visit: Payer: Self-pay

## 2022-11-07 ENCOUNTER — Telehealth: Payer: Self-pay | Admitting: Family Medicine

## 2022-11-07 ENCOUNTER — Other Ambulatory Visit: Payer: Self-pay

## 2022-11-07 ENCOUNTER — Other Ambulatory Visit: Payer: Self-pay | Admitting: Family Medicine

## 2022-11-07 NOTE — Telephone Encounter (Signed)
Left voicemail to ask patient if she wanted the 1mg  or to stay at her current dose of Wegovy.

## 2022-11-07 NOTE — Telephone Encounter (Signed)
Can you please send the wegovy to [facility], [place]

## 2022-11-07 NOTE — Telephone Encounter (Signed)
Copied from CRM [id number]. Topic: General - Other >> Nov 07, 2022  9:03 AM Phill Myron wrote: Ms. Lackman get her medication from [facility] - [place], [place] -  but she is starting to receive calls from [facility] [place] [phone]... Does she have to change pharmacies? PLease advise.

## 2022-11-08 ENCOUNTER — Other Ambulatory Visit: Payer: Self-pay | Admitting: Family Medicine

## 2022-11-08 MED ORDER — WEGOVY 0.5 MG/0.5ML ~~LOC~~ SOAJ: 0.5000 mg | SUBCUTANEOUS | 0 refills | Status: DC

## 2022-11-09 ENCOUNTER — Other Ambulatory Visit: Payer: Self-pay | Admitting: Family Medicine

## 2022-11-09 ENCOUNTER — Ambulatory Visit
Admission: RE | Admit: 2022-11-09 | Discharge: 2022-11-09 | Disposition: A | Discharge status: Home / Self Care | Payer: Managed Care, Other (non HMO) | Source: Ambulatory Visit | Attending: Family Medicine | Admitting: Family Medicine

## 2022-11-09 DIAGNOSIS — Z1231 Encounter for screening mammogram for malignant neoplasm of breast: Secondary | ICD-10-CM | POA: Diagnosis not present

## 2022-11-14 ENCOUNTER — Telehealth: Payer: Self-pay

## 2022-11-14 ENCOUNTER — Other Ambulatory Visit: Payer: Self-pay

## 2022-11-14 DIAGNOSIS — R197 Diarrhea, unspecified: Secondary | ICD-10-CM

## 2022-11-14 DIAGNOSIS — Z1211 Encounter for screening for malignant neoplasm of colon: Secondary | ICD-10-CM

## 2022-11-14 MED ORDER — PEG 3350-KCL-NA BICARB-NACL 420 G PO SOLR: 4000.0000 mL | Freq: Once | ORAL | 0 refills | Status: AC

## 2022-11-14 NOTE — Telephone Encounter (Signed)
Spoke with patient-she would like to schedule colonoscopy for 12/26/22 with Dr.Anna.  Colonoscopy scheduled 12/26/22 -Golytely sent to pharmacy.

## 2022-11-14 NOTE — Telephone Encounter (Signed)
Patient called in to reschedule her colonoscopy.

## 2022-11-15 ENCOUNTER — Ambulatory Visit
Admission: RE | Admit: 2022-11-15 | Payer: Managed Care, Other (non HMO) | Source: Home / Self Care | Admitting: Gastroenterology

## 2022-11-15 ENCOUNTER — Encounter: Admission: RE | Payer: Self-pay | Source: Home / Self Care

## 2022-11-15 SURGERY — COLONOSCOPY WITH PROPOFOL
Anesthesia: General

## 2022-11-16 NOTE — Progress Notes (Unsigned)
Name: Tanya Robinson   MRN: [id number]    DOB: April 16, 1976   Date:11/19/2022       Progress Note  Subjective  Chief Complaint  Follow Up  HPI  Obesity: she states her high school weight was 140 lbs. When she delivered her son ( 1999 ) she was 176 lb and maintaining the weight around 170 lbs for many years. Over the past few years weight has been trending up, staying in the 180 lbs and early 2023 it went up to 194 lbs. She was eating healthier meals but large portions. . She tried [place] and because of nation wide shortage we tried too give Saxenda but out of stock, she had lost she lost 8 lbs on the medication but unable to fill it, she is now driving to [facility] to get rx of Wegovy filled - resumed medication Feb 2023 at a weight os 192 lbs .  A couple of weeks after taking 1 mg dose she had nausea, vomiting and diarrhea and had to go down to 0.5 mg dose, explained she may have had a viral gastroenteritis since it did not happen right after she increased the dose and try to move it up again. Today's weight is down to 170 lbs   Hyperlipidemia: mother takes cholesterol medication, father has diabetes, her LDL last year was 51 and in June 2023 was down to 173 discussed familial dyslipidemia. Started her on Crestor 10 mg in June and is tolerating it well, we will recheck labs next visit   Vitamin D : she is taking rx vitamin D weekly , last level was normal   GERD: she went to [facility] 01/23 with severe burning sensation on esophagus and dysphagia. She was given GI cocktail and sent home on PPI. Symptoms got a little worse when resumed Wegovy early 2024 but now is taking pantoprazole twice daily and is doing well   Carpal tunnel and right wrist carpometacarpal OA: went back to Ortho was advised to use ice and alternate with heat, she is doing better since she completed PT and is doing home exercise and change to ergonomic keyboard Unchanged  Pre-diabetes: losing weight, going to the gym, last A1C was 5.4  % It used to be 5.7 %   Vasomotor symptoms: she had a hysterectomy over 10 years ago, she has noticed night sweats and hot flashes  since Spring 2024 , she states since she changed to lighter clothes to sleep she is doing better.   Patient Active Problem List   Diagnosis Date Noted   Prediabetes 10/03/2021   Obesity (BMI 30.0-34.9) 08/28/2021   Primary osteoarthritis of first carpometacarpal joint of right hand 08/28/2021   Carpal tunnel syndrome of right wrist 08/28/2021   GERD without esophagitis 05/15/2021   Cervical high risk HPV (human papillomavirus) test positive 07/30/2016   Bee sting allergy 07/28/2015   Dyslipidemia 11/02/2014   Migraine without aura and responsive to treatment 11/02/2014   Vitamin D deficiency 11/02/2014   Urinary incontinence in female 11/02/2014    Past Surgical History:  Procedure Laterality Date   ABDOMINAL HYSTERECTOMY     TUBAL LIGATION  2009    Family History  Problem Relation Age of Onset   Hypertension Mother    Hyperlipidemia Mother    Diabetes Father     Social History   Tobacco Use   Smoking status: Never   Smokeless tobacco: Never  Substance Use Topics   Alcohol use: Not Currently    Comment: occasionally  Current Outpatient Medications:    EPINEPHrine 0.3 mg/0.3 mL IJ SOAJ injection, , Disp: , Rfl:    pantoprazole (PROTONIX) 20 MG tablet, Take 1 tablet (20 mg total) by mouth 2 (two) times daily., Disp: 180 tablet, Rfl: 0   rosuvastatin (CRESTOR) 10 MG tablet, Take 1 tablet (10 mg total) by mouth daily., Disp: 90 tablet, Rfl: 3   Semaglutide-Weight Management (WEGOVY) 0.5 MG/0.5ML SOAJ, Inject 0.5 mg into the skin once a week., Disp: 2 mL, Rfl: 0   Vitamin D, Ergocalciferol, (DRISDOL) 1.25 MG (50000 UNIT) CAPS capsule, Take 1 capsule (50,000 Units total) by mouth every 7 (seven) days., Disp: 12 capsule, Rfl: 1   Sodium Sulfate-Mag Sulfate-KCl (SUTAB) [phone] MG TABS, Take 1 tablet by mouth in the morning and at bedtime.  Patient has instructions. (Patient not taking: Reported on 11/19/2022), Disp: 24 tablet, Rfl: 0  Allergies  Allergen Reactions   Tea Hives   Bee Venom     Bee    I personally reviewed active problem list, medication list, allergies, family history, social history, health maintenance with the patient/caregiver today.   ROS  Ten systems reviewed and is negative except as mentioned in HPI    Objective  Vitals:   11/19/22 1328  BP: 118/68  Pulse: 87  Resp: 16  SpO2: 100%  Weight: 170 lb (77.1 kg)  Height: 5\' 6"  (1.676 m)    Body mass index is 27.44 kg/m.  Physical Exam  Constitutional: Patient appears well-developed and well-nourished.  No distress.  HEENT: head atraumatic, normocephalic, pupils equal and reactive to light, neck supple Cardiovascular: Normal rate, regular rhythm and normal heart sounds.  No murmur heard. No BLE edema. Pulmonary/Chest: Effort normal and breath sounds normal. No respiratory distress. Abdominal: Soft.  There is no tenderness. Psychiatric: Patient has a normal mood and affect. behavior is normal. Judgment and thought content normal.    PHQ2/9:    11/19/2022    1:27 PM 10/08/2022    3:12 PM 08/20/2022    8:36 AM 05/15/2022    3:26 PM 03/12/2022    1:01 PM  Depression screen PHQ 2/9  Decreased Interest 0 0 0 0 0  Down, Depressed, Hopeless 0 0 0 0 0  PHQ - 2 Score 0 0 0 0 0  Altered sleeping 0 0 0 0   Tired, decreased energy 0 0 0 0   Change in appetite 0 0 0 0   Feeling bad or failure about yourself  0 0 0 0   Trouble concentrating 0 0 0 0   Moving slowly or fidgety/restless 0 0 0 0   Suicidal thoughts 0 0 0 0   PHQ-9 Score 0 0 0 0     phq 9 is negative   Fall Risk:    11/19/2022    1:27 PM 10/08/2022    3:12 PM 08/29/2022    8:31 AM 08/20/2022    8:35 AM 05/15/2022    3:26 PM  Fall Risk   Falls in the past year? 0 0 0 0 0  Number falls in past yr: 0   0   Injury with Fall? 0   0   Risk for fall due to : No Fall Risks  No Fall  Risks No Fall Risks No Fall Risks  Follow up Falls prevention discussed Falls prevention discussed;Education provided;Falls evaluation completed Falls prevention discussed Falls prevention discussed Falls evaluation completed;Falls prevention discussed;Education provided      Functional Status Survey: Is the patient deaf  or have difficulty hearing?: No Does the patient have difficulty seeing, even when wearing glasses/contacts?: No Does the patient have difficulty concentrating, remembering, or making decisions?: No Does the patient have difficulty walking or climbing stairs?: No Does the patient have difficulty dressing or bathing?: No Does the patient have difficulty doing errands alone such as visiting a doctor's office or shopping?: No    Assessment & Plan  1. Familial hyperlipidemia  Taking crestor now  2. Prediabetes  Doing better on GLP-1 agonist  3. Vitamin D deficiency  On supplementation   4. GERD without esophagitis  Controlled   5. Overweight (BMI 25.0-29.9)  Doing well with life style modification, exercise and GLP-1

## 2022-11-19 ENCOUNTER — Encounter: Payer: Self-pay | Admitting: Family Medicine

## 2022-11-19 ENCOUNTER — Ambulatory Visit (INDEPENDENT_AMBULATORY_CARE_PROVIDER_SITE_OTHER): Payer: Managed Care, Other (non HMO) | Admitting: Family Medicine

## 2022-11-19 VITALS — BP 118/68 | HR 87 | Resp 16 | Ht 66.0 in | Wt 170.0 lb

## 2022-11-19 DIAGNOSIS — R7303 Prediabetes: Secondary | ICD-10-CM

## 2022-11-19 DIAGNOSIS — E663 Overweight: Secondary | ICD-10-CM

## 2022-11-19 DIAGNOSIS — E7849 Other hyperlipidemia: Secondary | ICD-10-CM

## 2022-11-19 DIAGNOSIS — E559 Vitamin D deficiency, unspecified: Secondary | ICD-10-CM | POA: Diagnosis not present

## 2022-11-19 DIAGNOSIS — K219 Gastro-esophageal reflux disease without esophagitis: Secondary | ICD-10-CM | POA: Diagnosis not present

## 2022-12-14 ENCOUNTER — Other Ambulatory Visit: Payer: Self-pay | Admitting: Family Medicine

## 2022-12-17 MED ORDER — WEGOVY 0.5 MG/0.5ML ~~LOC~~ SOAJ
0.5000 mg | SUBCUTANEOUS | 0 refills | Status: DC
  Filled 2022-12-17: qty 2, 28d supply, fill #0

## 2022-12-18 ENCOUNTER — Other Ambulatory Visit (HOSPITAL_COMMUNITY): Payer: Self-pay

## 2022-12-18 ENCOUNTER — Other Ambulatory Visit: Payer: Self-pay

## 2022-12-18 ENCOUNTER — Other Ambulatory Visit: Payer: Self-pay | Admitting: Family Medicine

## 2022-12-18 ENCOUNTER — Encounter: Payer: Self-pay | Admitting: Family Medicine

## 2022-12-18 MED ORDER — WEGOVY 1 MG/0.5ML ~~LOC~~ SOAJ: 1.0000 mg | SUBCUTANEOUS | 0 refills | Status: DC

## 2022-12-18 MED ORDER — WEGOVY 0.5 MG/0.5ML ~~LOC~~ SOAJ: 0.5000 mg | SUBCUTANEOUS | 0 refills | Status: DC

## 2022-12-19 ENCOUNTER — Ambulatory Visit: Payer: Managed Care, Other (non HMO) | Admitting: Physician Assistant

## 2022-12-25 ENCOUNTER — Encounter: Payer: Self-pay | Admitting: Gastroenterology

## 2022-12-26 ENCOUNTER — Ambulatory Visit: Payer: Managed Care, Other (non HMO) | Admitting: Certified Registered"

## 2022-12-26 ENCOUNTER — Ambulatory Visit
Admission: RE | Admit: 2022-12-26 | Discharge: 2022-12-26 | Disposition: A | Discharge status: Home / Self Care | Payer: Managed Care, Other (non HMO) | Attending: Gastroenterology | Admitting: Gastroenterology

## 2022-12-26 ENCOUNTER — Encounter: Payer: Self-pay | Admitting: Gastroenterology

## 2022-12-26 ENCOUNTER — Encounter
Admission: RE | Disposition: A | Discharge status: Home / Self Care | Payer: Self-pay | Source: Home / Self Care | Attending: Gastroenterology

## 2022-12-26 DIAGNOSIS — R197 Diarrhea, unspecified: Secondary | ICD-10-CM

## 2022-12-26 DIAGNOSIS — Z79899 Other long term (current) drug therapy: Secondary | ICD-10-CM | POA: Insufficient documentation

## 2022-12-26 DIAGNOSIS — K219 Gastro-esophageal reflux disease without esophagitis: Secondary | ICD-10-CM | POA: Diagnosis not present

## 2022-12-26 DIAGNOSIS — E785 Hyperlipidemia, unspecified: Secondary | ICD-10-CM | POA: Insufficient documentation

## 2022-12-26 DIAGNOSIS — Z1211 Encounter for screening for malignant neoplasm of colon: Secondary | ICD-10-CM | POA: Diagnosis present

## 2022-12-26 HISTORY — PX: COLONOSCOPY WITH PROPOFOL: SHX5780

## 2022-12-26 SURGERY — COLONOSCOPY WITH PROPOFOL
Anesthesia: General

## 2022-12-26 MED ORDER — PROPOFOL 10 MG/ML IV BOLUS
INTRAVENOUS | Status: DC | PRN
  Administered 2022-12-26: 70 mg via INTRAVENOUS
  Administered 2022-12-26: 20 mg via INTRAVENOUS

## 2022-12-26 MED ORDER — GLYCOPYRROLATE 0.2 MG/ML IJ SOLN
INTRAMUSCULAR | Status: DC | PRN
  Administered 2022-12-26: .2 mg via INTRAVENOUS

## 2022-12-26 MED ORDER — LIDOCAINE HCL (CARDIAC) PF 100 MG/5ML IV SOSY
PREFILLED_SYRINGE | INTRAVENOUS | Status: DC | PRN
  Administered 2022-12-26: 50 mg via INTRAVENOUS

## 2022-12-26 MED ORDER — DEXMEDETOMIDINE HCL IN NACL 200 MCG/50ML IV SOLN
INTRAVENOUS | Status: DC | PRN
Start: 2022-12-26 — End: 2022-12-26
  Administered 2022-12-26: 8 ug via INTRAVENOUS

## 2022-12-26 MED ORDER — PROPOFOL 500 MG/50ML IV EMUL
INTRAVENOUS | Status: DC | PRN
  Administered 2022-12-26: 150 ug/kg/min via INTRAVENOUS

## 2022-12-26 MED ORDER — SODIUM CHLORIDE 0.9 % IV SOLN: INTRAVENOUS | Status: DC

## 2022-12-26 NOTE — Anesthesia Postprocedure Evaluation (Signed)
Anesthesia Post Note  Patient: Tanya Robinson  Procedure(s) Performed: COLONOSCOPY WITH PROPOFOL  Patient location during evaluation: Endoscopy Anesthesia Type: General Level of consciousness: awake and alert Pain management: pain level controlled Vital Signs Assessment: post-procedure vital signs reviewed and stable Respiratory status: spontaneous breathing, nonlabored ventilation, respiratory function stable and patient connected to nasal cannula oxygen Cardiovascular status: blood pressure returned to baseline and stable Postop Assessment: no apparent nausea or vomiting Anesthetic complications: no  No notable events documented.   Last Vitals:  Vitals:   12/26/22 0843 12/26/22 0912  BP: (!) 141/79   Pulse: 66 82  Resp: 16 16  Temp: (!) 35.6 C   SpO2: 100% 99%    Last Pain:  Vitals:   12/26/22 0912  TempSrc:   PainSc: 0-No pain                 Stephanie Coup

## 2022-12-26 NOTE — Transfer of Care (Signed)
Immediate Anesthesia Transfer of Care Note  Patient: Tanya Robinson  Procedure(s) Performed: COLONOSCOPY WITH PROPOFOL  Patient Location: PACU and Endoscopy Unit  Anesthesia Type:General  Level of Consciousness: awake  Airway & Oxygen Therapy: Patient Spontanous Breathing  Post-op Assessment: Report given to RN and Post -op Vital signs reviewed and stable  Post vital signs: Reviewed and stable  Last Vitals:  Vitals Value Taken Time  BP 93/66 12/26/22 0913  Temp    Pulse 80 12/26/22 0914  Resp 19 12/26/22 0914  SpO2 100 % 12/26/22 0914  Vitals shown include unfiled device data.  Last Pain:  Vitals:   12/26/22 0912  TempSrc:   PainSc: 0-No pain         Complications: No notable events documented.

## 2022-12-26 NOTE — H&P (Signed)
Wyline Mood, MD [address], [place], [place], [address] [address], [address], [place], [place], [address] Phone: [phone]  Fax: [phone]  Primary Care Physician:  Alba Cory, MD   Pre-Procedure History & Physical: HPI:  Tanya Robinson is a 47 y.o. female is here for an colonoscopy.   Past Medical History:  Diagnosis Date   Anxiety    Dyslipidemia    GERD (gastroesophageal reflux disease)    Headache     Past Surgical History:  Procedure Laterality Date   ABDOMINAL HYSTERECTOMY     TUBAL LIGATION  2009    Prior to Admission medications   Medication Sig Start Date End Date Taking? Authorizing Provider  Semaglutide-Weight Management (WEGOVY) 1 MG/0.5ML SOAJ Inject 1 mg into the skin once a week. 12/18/22   Alba Cory, MD  EPINEPHrine 0.3 mg/0.3 mL IJ SOAJ injection  08/03/19   [provider]  pantoprazole (PROTONIX) 20 MG tablet Take 1 tablet (20 mg total) by mouth 2 (two) times daily. 08/20/22 02/16/23  Alba Cory, MD  rosuvastatin (CRESTOR) 10 MG tablet Take 1 tablet (10 mg total) by mouth daily. 10/09/22   Alba Cory, MD  Vitamin D, Ergocalciferol, (DRISDOL) 1.25 MG (50000 UNIT) CAPS capsule Take 1 capsule (50,000 Units total) by mouth every 7 (seven) days. 05/15/22   Alba Cory, MD    Allergies as of 11/14/2022 - Review Complete 10/08/2022  Allergen Reaction Noted   Tea Hives 05/11/2015   Bee venom  11/02/2014    Family History  Problem Relation Age of Onset   Hypertension Mother    Hyperlipidemia Mother    Diabetes Father     Social History   Socioeconomic History   Marital status: Married    Spouse name: [place]    Number of children: 2   Years of education: Not on file   Highest education level: High school graduate  Occupational History   Occupation: Financial controller: [employer]  Tobacco Use   Smoking status: Never   Smokeless tobacco: Never  Vaping Use   Vaping status:  Never Used  Substance and Sexual Activity   Alcohol use: Not Currently    Comment: occasionally   Drug use: No   Sexual activity: Yes    Partners: Male    Birth control/protection: Surgical  Other Topics Concern   Not on file  Social History Narrative   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 She started at [employer] April 2019, she has been working from home since March 2020 because of pandemic    She has been living with boyfriend ( together since 2011)   He was previously married, and he has twins from previously relationship.    Her two children, are grown now. A son and a daughter.    Daughter had a miscarriage in 2021   Social Determinants of Health   Financial Resource Strain: Low Risk  (10/03/2021)   Overall Financial Resource Strain (CARDIA)    Difficulty of Paying Living Expenses: Not hard at all  Food Insecurity: No Food Insecurity (10/03/2021)   Hunger Vital Sign    Worried About Running Out of Food in the Last Year: Never true    Ran Out of Food in the Last Year: Never true  Transportation Needs: No Transportation Needs (10/03/2021)   PRAPARE - Administrator, Civil Service (Medical): No    Lack of Transportation (Non-Medical): No  Physical Activity: Sufficiently Active (10/08/2022)   Exercise Vital Sign    Days of Exercise per Week: 3 days    Minutes of Exercise per Session: 60 min  Stress: No Stress Concern Present (10/03/2021)   [employer] of Occupational Health - Occupational Stress Questionnaire    Feeling of Stress : Not at all  Social Connections: Moderately Integrated (10/03/2021)   Social Connection and Isolation Panel [NHANES]    Frequency of Communication with Friends and Family: More than three times a week    Frequency of Social Gatherings with Friends and Family: Three times a week    Attends Religious Services: 1 to 4 times per year    Active Member of Clubs or Organizations: No    Attends Banker  Meetings: Never    Marital Status: Married  Catering manager Violence: Not At Risk (10/03/2021)   Humiliation, Afraid, Rape, and Kick questionnaire    Fear of Current or Ex-Partner: No    Emotionally Abused: No    Physically Abused: No    Sexually Abused: No    Review of Systems: See HPI, otherwise negative ROS  Physical Exam: There were no vitals taken for this visit. General:   Alert,  pleasant and cooperative in NAD Head:  Normocephalic and atraumatic. Neck:  Supple; no masses or thyromegaly. Lungs:  Clear throughout to auscultation, normal respiratory effort.    Heart:  +S1, +S2, Regular rate and rhythm, No edema. Abdomen:  Soft, nontender and nondistended. Normal bowel sounds, without guarding, and without rebound.   Neurologic:  Alert and  oriented x4;  grossly normal neurologically.  Impression/Plan: Tanya Robinson is here for an colonoscopy to be performed for Screening colonoscopy average risk   Risks, benefits, limitations, and alternatives regarding  colonoscopy have been reviewed with the patient.  Questions have been answered.  All parties agreeable.   Wyline Mood, MD  12/26/2022, 8:23 AM

## 2022-12-26 NOTE — Anesthesia Procedure Notes (Signed)
Procedure Name: MAC Date/Time: 12/26/2022 8:57 AM  Performed by: Cheral Bay, CRNAPre-anesthesia Checklist: Patient identified, Emergency Drugs available, Suction available, Patient being monitored and Timeout performed Patient Re-evaluated:Patient Re-evaluated prior to induction Oxygen Delivery Method: Nasal cannula Induction Type: IV induction Placement Confirmation: positive ETCO2 and CO2 detector

## 2022-12-26 NOTE — Anesthesia Preprocedure Evaluation (Signed)
Anesthesia Evaluation  Patient identified by MRN, date of birth, ID band Patient awake    Reviewed: Allergy & Precautions, NPO status , Patient's Chart, lab work & pertinent test results  Airway Mallampati: III  TM Distance: >3 FB Neck ROM: full    Dental  (+) Chipped   Pulmonary neg pulmonary ROS   Pulmonary exam normal        Cardiovascular negative cardio ROS Normal cardiovascular exam     Neuro/Psych negative neurological ROS  negative psych ROS   GI/Hepatic Neg liver ROS,GERD  Medicated,,  Endo/Other  negative endocrine ROS    Renal/GU negative Renal ROS  negative genitourinary   Musculoskeletal   Abdominal   Peds  Hematology negative hematology ROS (+)   Anesthesia Other Findings Patient stopped her wegovy 2 weeks ago.  Past Medical History: No date: Anxiety No date: Dyslipidemia No date: GERD (gastroesophageal reflux disease) No date: Headache  Past Surgical History: No date: ABDOMINAL HYSTERECTOMY 2009: TUBAL LIGATION  BMI    Body Mass Index: 27.02 kg/m      Reproductive/Obstetrics negative OB ROS                             Anesthesia Physical Anesthesia Plan  ASA: 2  Anesthesia Plan: General   Post-op Pain Management: Minimal or no pain anticipated   Induction: Intravenous  PONV Risk Score and Plan: 3 and Propofol infusion, TIVA and Ondansetron  Airway Management Planned: Nasal Cannula  Additional Equipment: None  Intra-op Plan:   Post-operative Plan:   Informed Consent: I have reviewed the patients History and Physical, chart, labs and discussed the procedure including the risks, benefits and alternatives for the proposed anesthesia with the patient or authorized representative who has indicated his/her understanding and acceptance.     Dental advisory given  Plan Discussed with: CRNA and Surgeon  Anesthesia Plan Comments: (Discussed risks of  anesthesia with patient, including possibility of difficulty with spontaneous ventilation under anesthesia necessitating airway intervention, PONV, and rare risks such as cardiac or respiratory or neurological events, and allergic reactions. Discussed the role of CRNA in patient's perioperative care. Patient understands.)       Anesthesia Quick Evaluation

## 2022-12-26 NOTE — Op Note (Signed)
[facility] Patient Name: Tanya Robinson Procedure Date: 12/26/2022 8:55 AM MRN: [id number] Account #: [id number] Date of Birth: [birth year] Admit Type: Outpatient Age: 47 Room: [facility] ENDO ROOM 3 Gender: Female Note Status: Finalized Instrument Name: Prentice Docker [id number] Procedure:             Colonoscopy Indications:           Screening for colorectal malignant neoplasm Providers:             Wyline Mood MD, MD Referring MD:          Onnie Boer. Sowles, MD (Referring MD) Medicines:             Monitored Anesthesia Care Complications:         No immediate complications. Procedure:             Pre-Anesthesia Assessment:                        - Prior to the procedure, a History and Physical was                         performed, and patient medications, allergies and                         sensitivities were reviewed. The patient's tolerance                         of previous anesthesia was reviewed.                        - The risks and benefits of the procedure and the                         sedation options and risks were discussed with the                         patient. All questions were answered and informed                         consent was obtained.                        - ASA Grade Assessment: II - A patient with mild                         systemic disease.                        After obtaining informed consent, the colonoscope was                         passed under direct vision. Throughout the procedure,                         the patient's blood pressure, pulse, and oxygen                         saturations were monitored continuously. The                         Colonoscope was  introduced through the anus and                         advanced to the the cecum, identified by the                         appendiceal orifice. The colonoscopy was performed                         with ease. The patient tolerated the procedure well.                          The quality of the bowel preparation was excellent.                         The ileocecal valve, appendiceal orifice, and rectum                         were photographed. Findings:      The perianal and digital rectal examinations were normal.      The entire examined colon appeared normal on direct and retroflexion       views. Impression:            - The entire examined colon is normal on direct and                         retroflexion views.                        - No specimens collected. Recommendation:        - Discharge patient to home (with escort).                        - Resume previous diet.                        - Continue present medications.                        - Await pathology results.                        - Repeat colonoscopy in 10 years for screening                         purposes. Procedure Code(s):     --- Professional ---                        [phone], Colonoscopy, flexible; diagnostic, including                         collection of specimen(s) by brushing or washing, when                         performed (separate procedure) Diagnosis Code(s):     --- Professional ---                        Z12.11, Encounter for screening for malignant neoplasm  of colon CPT copyright 2022 American Medical Association. All rights reserved. The codes documented in this report are preliminary and upon coder review may  be revised to meet current compliance requirements. Wyline Mood, MD Wyline Mood MD, MD 12/26/2022 9:11:04 AM This report has been signed electronically. Number of Addenda: 0 Note Initiated On: 12/26/2022 8:55 AM Scope Withdrawal Time: 0 hours 6 minutes 11 seconds  Total Procedure Duration: 0 hours 8 minutes 18 seconds  Estimated Blood Loss:  Estimated blood loss: none.      [facility]

## 2022-12-27 ENCOUNTER — Encounter: Payer: Self-pay | Admitting: Gastroenterology

## 2022-12-31 ENCOUNTER — Other Ambulatory Visit: Payer: Self-pay | Admitting: Family Medicine

## 2022-12-31 MED ORDER — OZEMPIC (0.25 OR 0.5 MG/DOSE) 2 MG/1.5ML ~~LOC~~ SOPN: 0.5000 mg | PEN_INJECTOR | SUBCUTANEOUS | 0 refills | Status: DC

## 2023-01-01 ENCOUNTER — Other Ambulatory Visit: Payer: Self-pay | Admitting: Family Medicine

## 2023-01-01 ENCOUNTER — Other Ambulatory Visit: Payer: Self-pay

## 2023-01-01 MED ORDER — OZEMPIC (0.25 OR 0.5 MG/DOSE) 2 MG/1.5ML ~~LOC~~ SOPN: 0.5000 mg | PEN_INJECTOR | SUBCUTANEOUS | 0 refills | Status: DC

## 2023-01-16 ENCOUNTER — Other Ambulatory Visit: Payer: Self-pay | Admitting: Nurse Practitioner

## 2023-01-16 DIAGNOSIS — E663 Overweight: Secondary | ICD-10-CM

## 2023-01-16 MED ORDER — WEGOVY 0.5 MG/0.5ML ~~LOC~~ SOAJ
0.5000 mg | SUBCUTANEOUS | 0 refills | Status: DC
Start: 2023-01-16 — End: 2023-02-13

## 2023-01-22 ENCOUNTER — Encounter: Payer: Self-pay | Admitting: Physician Assistant

## 2023-01-22 ENCOUNTER — Ambulatory Visit (INDEPENDENT_AMBULATORY_CARE_PROVIDER_SITE_OTHER): Payer: Managed Care, Other (non HMO) | Admitting: Physician Assistant

## 2023-01-22 ENCOUNTER — Ambulatory Visit: Payer: Managed Care, Other (non HMO) | Admitting: Physician Assistant

## 2023-01-22 VITALS — BP 129/88 | HR 75 | Temp 98.4°F | Ht 66.0 in | Wt 169.8 lb

## 2023-01-22 DIAGNOSIS — Z09 Encounter for follow-up examination after completed treatment for conditions other than malignant neoplasm: Secondary | ICD-10-CM | POA: Diagnosis not present

## 2023-01-22 DIAGNOSIS — Z1211 Encounter for screening for malignant neoplasm of colon: Secondary | ICD-10-CM

## 2023-01-22 DIAGNOSIS — Z8719 Personal history of other diseases of the digestive system: Secondary | ICD-10-CM

## 2023-01-22 NOTE — Progress Notes (Signed)
Celso Amy, PA-C [address]  [address]  [place], [place] [address]  Main: [phone]  Fax: [phone]   Primary Care Physician: Alba Cory, MD  Primary Gastroenterologist:  Celso Amy, PA-C / Dr. Wyline Mood    CC: Follow-up nausea, vomiting, diarrhea  HPI: Tanya Robinson is a 47 y.o. female returns for follow-up of nausea, vomiting, diarrhea.  She was started on [facility] February 2024.  When her dose was increased to 1.7 Mg, she began having nausea, vomiting, diarrhea.  She is currently on a lower dose of Wegovy 0.5 Mg weekly and is tolerating this dose well.  She has not had any more episodes of nausea, vomiting, diarrhea on lower dose of medication.  She has no GI symptoms or concerns today.  Colonoscopy 12/26/22 by Dr. Tobi Bastos: Normal.  No polyps.  No biopsies.  Excellent prep (with Sutab pill prep).  10-year repeat.  No family history of colon cancer.  She is taking Protonix 20 Mg twice daily with good control of acid reflux.  Current Outpatient Medications  Medication Sig Dispense Refill   EPINEPHrine 0.3 mg/0.3 mL IJ SOAJ injection      pantoprazole (PROTONIX) 20 MG tablet Take 1 tablet (20 mg total) by mouth 2 (two) times daily. 180 tablet 0   rosuvastatin (CRESTOR) 10 MG tablet Take 1 tablet (10 mg total) by mouth daily. 90 tablet 3   Semaglutide-Weight Management (WEGOVY) 0.5 MG/0.5ML SOAJ Inject 0.5 mg into the skin once a week. 2 mL 0   Vitamin D, Ergocalciferol, (DRISDOL) 1.25 MG (50000 UNIT) CAPS capsule Take 1 capsule (50,000 Units total) by mouth every 7 (seven) days. 12 capsule 1   No current facility-administered medications for this visit.    Allergies as of 01/22/2023 - Review Complete 01/22/2023  Allergen Reaction Noted   Tea Hives 05/11/2015   Bee venom  11/02/2014    Past Medical History:  Diagnosis Date   Anxiety    Dyslipidemia    GERD (gastroesophageal reflux disease)    Headache     Past Surgical History:  Procedure Laterality  Date   ABDOMINAL HYSTERECTOMY     COLONOSCOPY WITH PROPOFOL N/A 12/26/2022   Procedure: COLONOSCOPY WITH PROPOFOL;  Surgeon: Wyline Mood, MD;  Location: [facility] ENDOSCOPY;  Service: Gastroenterology;  Laterality: N/A;   TUBAL LIGATION  2009    Review of Systems:    All systems reviewed and negative except where noted in HPI.   Physical Examination:   BP 129/88   Pulse 75   Temp 98.4 F (36.9 C)   Ht 5\' 6"  (1.676 m)   Wt 169 lb 12.8 oz (77 kg)   BMI 27.41 kg/m   General: Well-nourished, well-developed in no acute distress.  Lungs: Clear to auscultation bilaterally. Non-labored. Heart: Regular rate and rhythm, no murmurs rubs or gallops.  Abdomen: Bowel sounds are normal; Abdomen is Soft; No hepatosplenomegaly, masses or hernias;  No Abdominal Tenderness; No guarding or rebound tenderness. Neuro: Alert and oriented x 3.  Grossly intact.  Psych: Alert and cooperative, normal mood and affect.   Imaging Studies: No results found.  Assessment and Plan:   Tanya Robinson is a 47 y.o. y/o female returns for follow-up of nausea, vomiting, diarrhea attributed to adverse side effect of taking Wegovy 1.7 Mg.  GI symptoms resolved on lower dose 0.5 Mg dose of Wegovy.  She was able to successfully complete a screening colonoscopy with Sutab prep.  Prep was excellent.  No polyps.  No family history of colon cancer.  Plan to repeat screening colonoscopy in 10 years.  Colon cancer screening guidelines were discussed.  Celso Amy, PA-C  Follow up as needed if she has any recurrent GI symptoms.

## 2023-01-22 NOTE — Patient Instructions (Signed)
It was great to see you.  Your recent colonoscopy was normal.   Plan to repeat a screening colonoscopy in 10 years.

## 2023-02-12 NOTE — Progress Notes (Unsigned)
Name: Tanya Robinson   MRN: [id number]    DOB: Feb 09, 1976   Date:02/12/2023       Progress Note  Subjective  Chief Complaint  Follow Up  HPI  Obesity: she states her high school weight was 140 lbs. When she delivered her son ( 1999 ) she was 176 lb and maintaining the weight around 170 lbs for many years. Over the past few years weight has been trending up, staying in the 180 lbs and early 2023 it went up to 194 lbs. She was eating healthier meals but large portions. . She tried [place] and because of nation wide shortage we tried too give Saxenda but out of stock, she had lost she lost 8 lbs on the medication but unable to fill it, she is now driving to [facility] to get rx of Wegovy filled - resumed medication Feb 2023 at a weight os 192 lbs .  A couple of weeks after taking 1 mg dose she had nausea, vomiting and diarrhea and had to go down to 0.5 mg dose, explained she may have had a viral gastroenteritis since it did not happen right after she increased the dose and try to move it up again. Today's weight is down to 170 lbs   Hyperlipidemia: mother takes cholesterol medication, father has diabetes, her LDL last year was 81 and in June 2023 was down to 173 discussed familial dyslipidemia. Started her on Crestor 10 mg in June and is tolerating it well, we will recheck labs next visit   Vitamin D : she is taking rx vitamin D weekly , last level was normal   GERD: she went to [facility] 01/23 with severe burning sensation on esophagus and dysphagia. She was given GI cocktail and sent home on PPI. Symptoms got a little worse when resumed Wegovy early 2024 but now is taking pantoprazole twice daily and is doing well   Carpal tunnel and right wrist carpometacarpal OA: went back to Ortho was advised to use ice and alternate with heat, she is doing better since she completed PT and is doing home exercise and change to ergonomic keyboard Unchanged  Pre-diabetes: losing weight, going to the gym, last A1C was  5.4 % It used to be 5.7 %   Vasomotor symptoms: she had a hysterectomy over 10 years ago, she has noticed night sweats and hot flashes  since Spring 2024 , she states since she changed to lighter clothes to sleep she is doing better.   Patient Active Problem List   Diagnosis Date Noted   Colon cancer screening 12/26/2022   Prediabetes 10/03/2021   Obesity (BMI 30.0-34.9) 08/28/2021   Primary osteoarthritis of first carpometacarpal joint of right hand 08/28/2021   Carpal tunnel syndrome of right wrist 08/28/2021   GERD without esophagitis 05/15/2021   Cervical high risk HPV (human papillomavirus) test positive 07/30/2016   Bee sting allergy 07/28/2015   Dyslipidemia 11/02/2014   Migraine without aura and responsive to treatment 11/02/2014   Vitamin D deficiency 11/02/2014   Urinary incontinence in female 11/02/2014    Past Surgical History:  Procedure Laterality Date   ABDOMINAL HYSTERECTOMY     COLONOSCOPY WITH PROPOFOL N/A 12/26/2022   Procedure: COLONOSCOPY WITH PROPOFOL;  Surgeon: Wyline Mood, MD;  Location: [facility] ENDOSCOPY;  Service: Gastroenterology;  Laterality: N/A;   TUBAL LIGATION  2009    Family History  Problem Relation Age of Onset   Hypertension Mother    Hyperlipidemia Mother    Diabetes Father  Social History   Tobacco Use   Smoking status: Never   Smokeless tobacco: Never  Substance Use Topics   Alcohol use: Not Currently    Comment: occasionally     Current Outpatient Medications:    EPINEPHrine 0.3 mg/0.3 mL IJ SOAJ injection, , Disp: , Rfl:    pantoprazole (PROTONIX) 20 MG tablet, Take 1 tablet (20 mg total) by mouth 2 (two) times daily., Disp: 180 tablet, Rfl: 0   rosuvastatin (CRESTOR) 10 MG tablet, Take 1 tablet (10 mg total) by mouth daily., Disp: 90 tablet, Rfl: 3   Semaglutide-Weight Management (WEGOVY) 0.5 MG/0.5ML SOAJ, Inject 0.5 mg into the skin once a week., Disp: 2 mL, Rfl: 0   Vitamin D, Ergocalciferol, (DRISDOL) 1.25 MG (50000 UNIT)  CAPS capsule, Take 1 capsule (50,000 Units total) by mouth every 7 (seven) days., Disp: 12 capsule, Rfl: 1  Allergies  Allergen Reactions   Tea Hives   Bee Venom     Bee    I personally reviewed active problem list, medication list, allergies, family history, social history, health maintenance with the patient/caregiver today.   ROS  Ten systems reviewed and is negative except as mentioned in HPI    Objective  There were no vitals filed for this visit.   There is no height or weight on file to calculate BMI.  Physical Exam  Constitutional: Patient appears well-developed and well-nourished.  No distress.  HEENT: head atraumatic, normocephalic, pupils equal and reactive to light, neck supple Cardiovascular: Normal rate, regular rhythm and normal heart sounds.  No murmur heard. No BLE edema. Pulmonary/Chest: Effort normal and breath sounds normal. No respiratory distress. Abdominal: Soft.  There is no tenderness. Psychiatric: Patient has a normal mood and affect. behavior is normal. Judgment and thought content normal.    PHQ2/9:    11/19/2022    1:27 PM 10/08/2022    3:12 PM 08/20/2022    8:36 AM 05/15/2022    3:26 PM 03/12/2022    1:01 PM  Depression screen PHQ 2/9  Decreased Interest 0 0 0 0 0  Down, Depressed, Hopeless 0 0 0 0 0  PHQ - 2 Score 0 0 0 0 0  Altered sleeping 0 0 0 0   Tired, decreased energy 0 0 0 0   Change in appetite 0 0 0 0   Feeling bad or failure about yourself  0 0 0 0   Trouble concentrating 0 0 0 0   Moving slowly or fidgety/restless 0 0 0 0   Suicidal thoughts 0 0 0 0   PHQ-9 Score 0 0 0 0     phq 9 is negative   Fall Risk:    11/19/2022    1:27 PM 10/08/2022    3:12 PM 08/29/2022    8:31 AM 08/20/2022    8:35 AM 05/15/2022    3:26 PM  Fall Risk   Falls in the past year? 0 0 0 0 0  Number falls in past yr: 0   0   Injury with Fall? 0   0   Risk for fall due to : No Fall Risks  No Fall Risks No Fall Risks No Fall Risks  Follow up  Falls prevention discussed Falls prevention discussed;Education provided;Falls evaluation completed Falls prevention discussed Falls prevention discussed Falls evaluation completed;Falls prevention discussed;Education provided      Functional Status Survey:      Assessment & Plan  1. Familial hyperlipidemia  Taking crestor now  2. Prediabetes  Doing better on GLP-1 agonist  3. Vitamin D deficiency  On supplementation   4. GERD without esophagitis  Controlled   5. Overweight (BMI 25.0-29.9)  Doing well with life style modification, exercise and GLP-1

## 2023-02-13 ENCOUNTER — Ambulatory Visit (INDEPENDENT_AMBULATORY_CARE_PROVIDER_SITE_OTHER): Payer: Managed Care, Other (non HMO) | Admitting: Family Medicine

## 2023-02-13 ENCOUNTER — Encounter: Payer: Self-pay | Admitting: Family Medicine

## 2023-02-13 VITALS — BP 126/80 | HR 87 | Temp 97.9°F | Resp 14 | Ht 66.0 in | Wt 166.3 lb

## 2023-02-13 DIAGNOSIS — E663 Overweight: Secondary | ICD-10-CM

## 2023-02-13 DIAGNOSIS — E7849 Other hyperlipidemia: Secondary | ICD-10-CM

## 2023-02-13 DIAGNOSIS — Z8639 Personal history of other endocrine, nutritional and metabolic disease: Secondary | ICD-10-CM | POA: Diagnosis not present

## 2023-02-13 DIAGNOSIS — K219 Gastro-esophageal reflux disease without esophagitis: Secondary | ICD-10-CM

## 2023-02-13 DIAGNOSIS — R7303 Prediabetes: Secondary | ICD-10-CM

## 2023-02-13 DIAGNOSIS — Z79899 Other long term (current) drug therapy: Secondary | ICD-10-CM

## 2023-02-13 DIAGNOSIS — E559 Vitamin D deficiency, unspecified: Secondary | ICD-10-CM | POA: Diagnosis not present

## 2023-02-13 DIAGNOSIS — Z23 Encounter for immunization: Secondary | ICD-10-CM | POA: Diagnosis not present

## 2023-02-13 MED ORDER — WEGOVY 0.5 MG/0.5ML ~~LOC~~ SOAJ
0.5000 mg | SUBCUTANEOUS | 0 refills | Status: DC
Start: 2023-02-13 — End: 2023-04-03

## 2023-02-13 MED ORDER — VITAMIN D (ERGOCALCIFEROL) 1.25 MG (50000 UNIT) PO CAPS
50000.0000 [IU] | ORAL_CAPSULE | ORAL | 1 refills | Status: DC
Start: 2023-02-13 — End: 2023-05-17

## 2023-02-13 MED ORDER — WEGOVY 0.5 MG/0.5ML ~~LOC~~ SOAJ
0.5000 mg | SUBCUTANEOUS | 0 refills | Status: DC
Start: 2023-02-13 — End: 2023-02-13

## 2023-03-01 ENCOUNTER — Other Ambulatory Visit: Payer: Self-pay | Admitting: Family Medicine

## 2023-03-02 LAB — NMR LIPOPROF + GRAPH
Cholesterol, Total: 211 mg/dL — ABNORMAL HIGH (ref 100–199)
HDL Particle Number: 27.2 umol/L — ABNORMAL LOW (ref >=30.5)
HDL-C: 55 mg/dL (ref >39)
LDL Particle Number: 1534 nmol/L — ABNORMAL HIGH (ref <1000)
LDL Size: 21.5 nm (ref >20.5)
LDL-C (NIH Calc): 140 mg/dL — ABNORMAL HIGH (ref 0–99)
LP-IR Score: <25 (ref <=45)
Small LDL Particle Number: 414 nmol/L (ref <=527)
Triglycerides: 87 mg/dL (ref 0–149)

## 2023-03-02 LAB — COMP. METABOLIC PANEL (12)
AST: 10 [IU]/L (ref 0–40)
Albumin: 4.5 g/dL (ref 3.9–4.9)
Alkaline Phosphatase: 59 [IU]/L (ref 44–121)
BUN/Creatinine Ratio: 6 — ABNORMAL LOW (ref 9–23)
BUN: 6 mg/dL (ref 6–24)
Bilirubin Total: 0.2 mg/dL (ref 0.0–1.2)
Calcium: 9.7 mg/dL (ref 8.7–10.2)
Chloride: 102 mmol/L (ref 96–106)
Creatinine, Ser: 0.95 mg/dL (ref 0.57–1.00)
Globulin, Total: 2.3 g/dL (ref 1.5–4.5)
Glucose: 69 mg/dL — ABNORMAL LOW (ref 70–99)
Potassium: 4.2 mmol/L (ref 3.5–5.2)
Sodium: 141 mmol/L (ref 134–144)
Total Protein: 6.8 g/dL (ref 6.0–8.5)
eGFR: 73 mL/min/{1.73_m2} (ref >59)

## 2023-03-02 LAB — LIPID PANEL
Chol/HDL Ratio: 3.8 ratio (ref 0.0–4.4)
Cholesterol, Total: 213 mg/dL — ABNORMAL HIGH (ref 100–199)
HDL: 56 mg/dL (ref >39)
LDL Chol Calc (NIH): 141 mg/dL — ABNORMAL HIGH (ref 0–99)
Triglycerides: 92 mg/dL (ref 0–149)
VLDL Cholesterol Cal: 16 mg/dL (ref 5–40)

## 2023-03-05 ENCOUNTER — Encounter: Payer: Self-pay | Admitting: Family Medicine

## 2023-03-24 IMAGING — DX DG CHEST 2V
2 series · 2 of 2 positions shown · non-contrast
Comparison: 05/08/2011.

CLINICAL DATA: chest pain

EXAM:
CHEST - 2 VIEW

[chest pa]
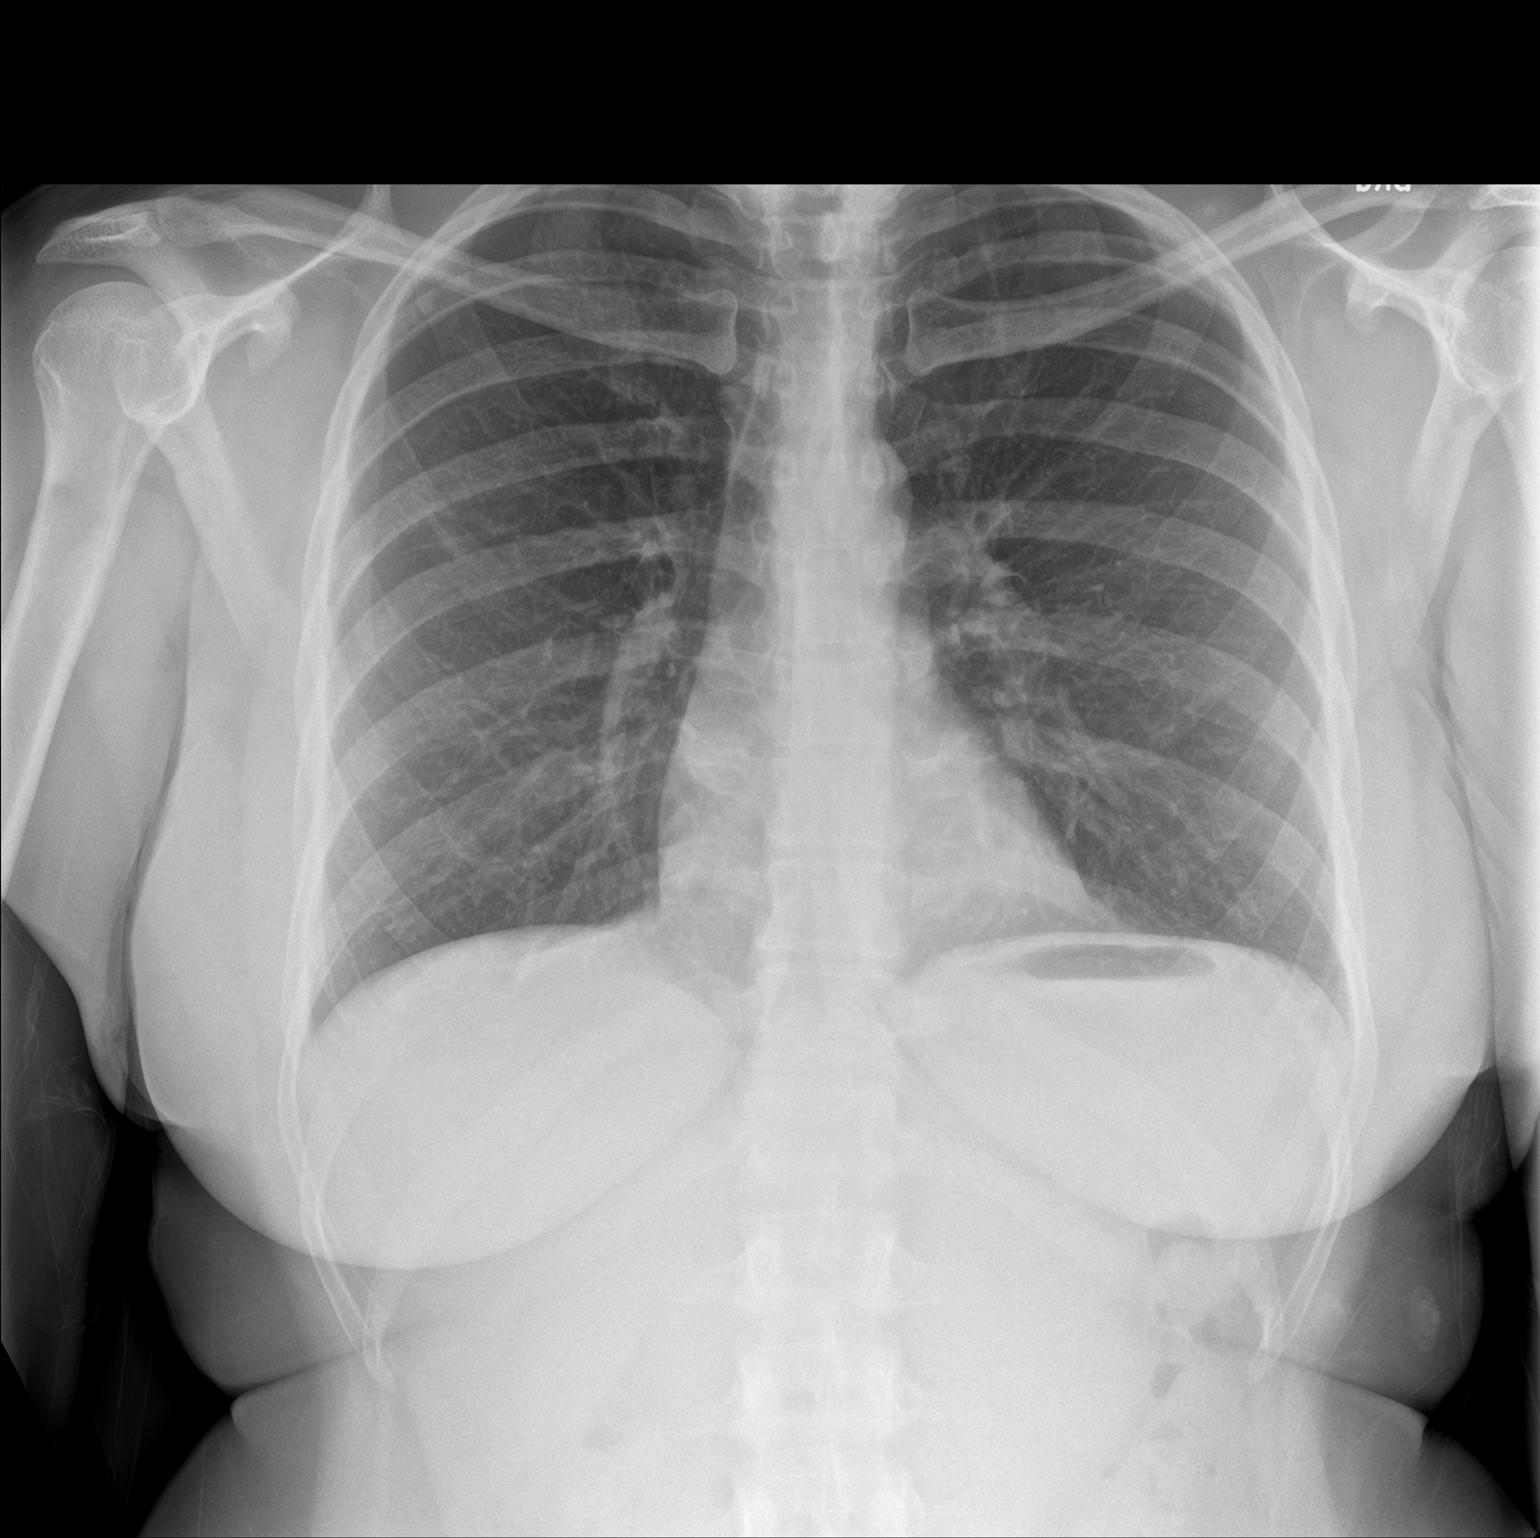

[chest lat]
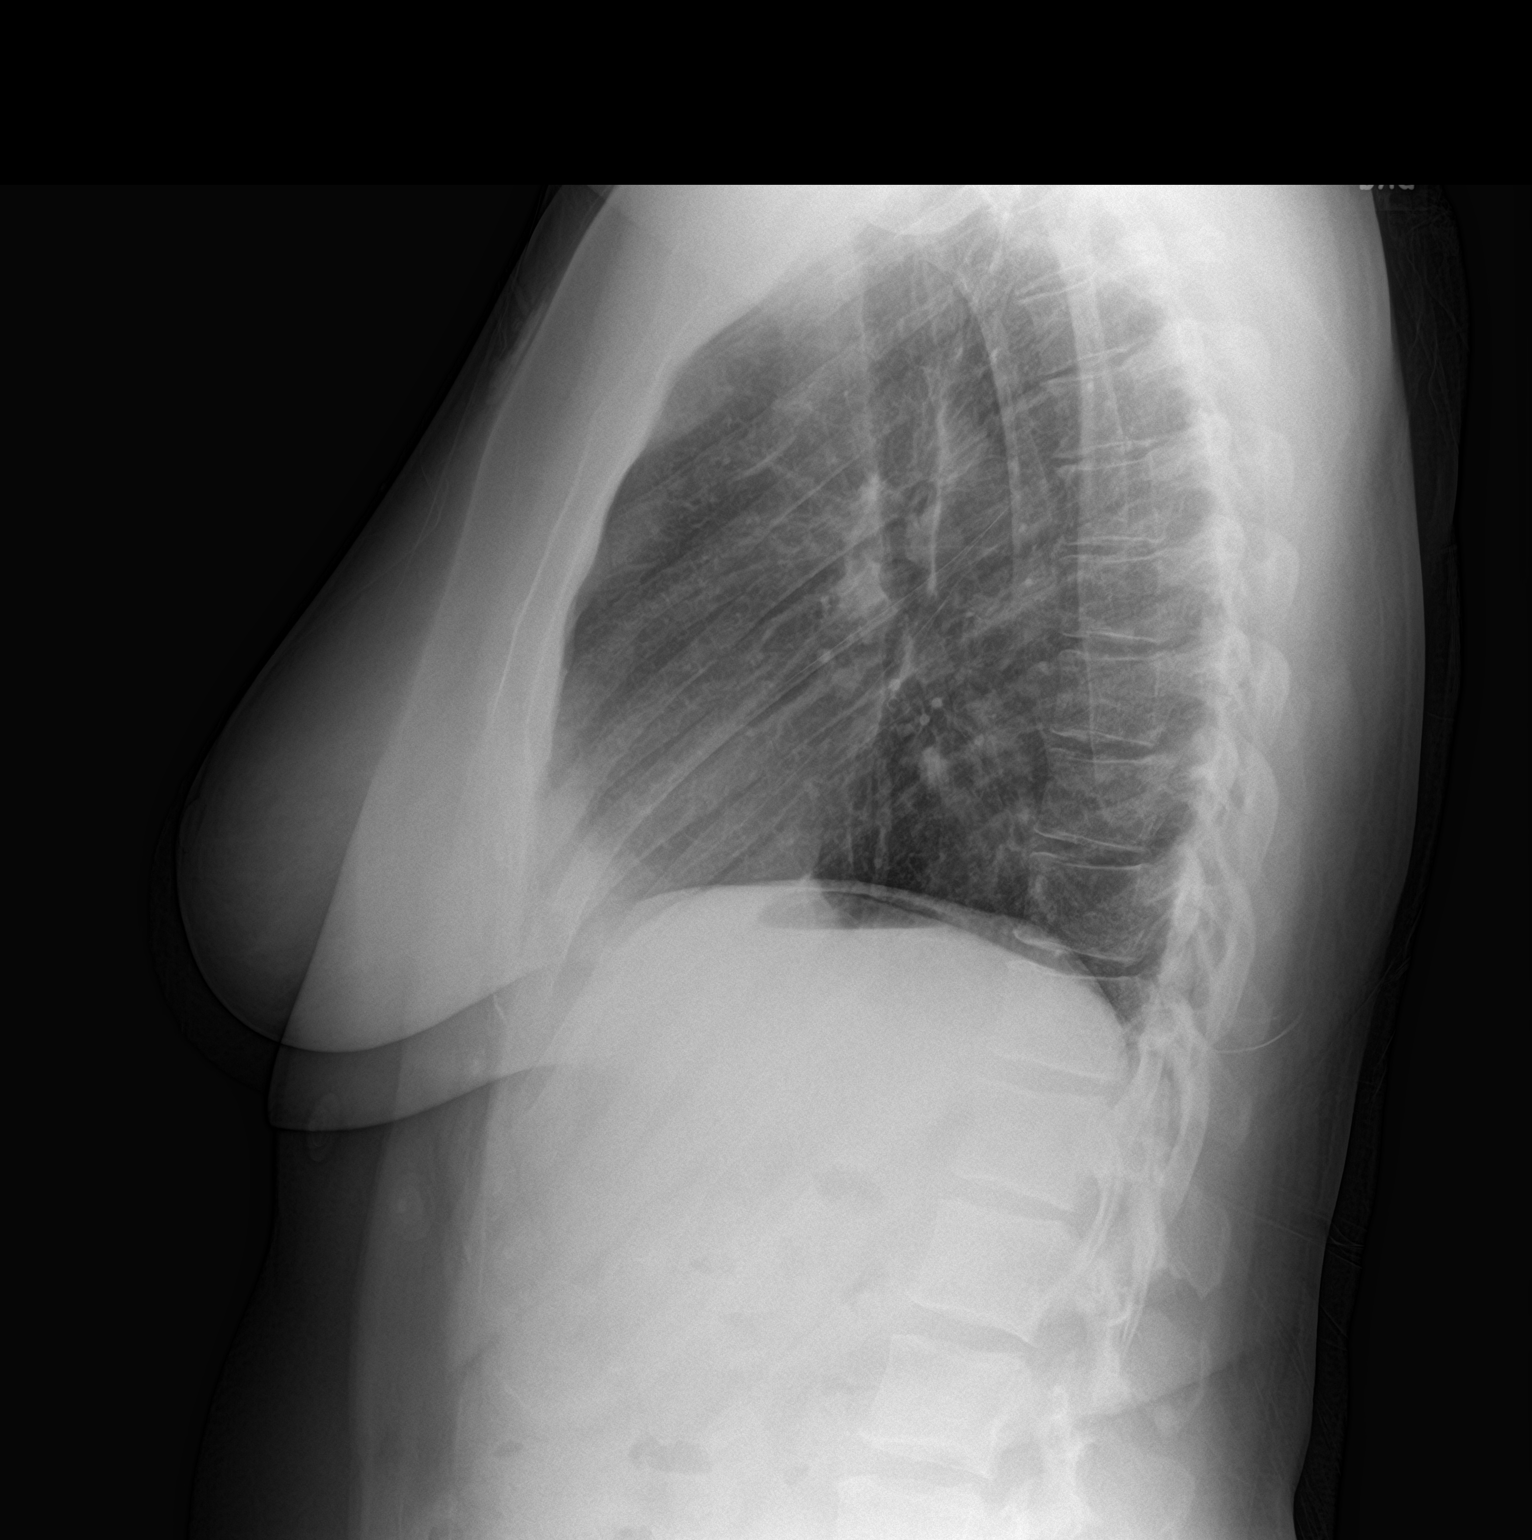

[2 of 2 positions shown; findings below may reference images not displayed]

FINDINGS: No consolidation. No visible pleural effusions or pneumothorax.
Cardiomediastinal silhouette is within normal limits and unchanged.
Slight reverse S-shaped thoracolumbar curvature.
IMPRESSION: No evidence of acute cardiopulmonary disease.

## 2023-04-03 ENCOUNTER — Other Ambulatory Visit: Payer: Self-pay | Admitting: Family Medicine

## 2023-04-03 DIAGNOSIS — Z8639 Personal history of other endocrine, nutritional and metabolic disease: Secondary | ICD-10-CM

## 2023-04-03 DIAGNOSIS — E663 Overweight: Secondary | ICD-10-CM

## 2023-04-03 MED ORDER — WEGOVY 0.5 MG/0.5ML ~~LOC~~ SOAJ
0.5000 mg | SUBCUTANEOUS | 0 refills | Status: DC
Start: 2023-04-03 — End: 2023-05-17

## 2023-04-04 ENCOUNTER — Encounter: Payer: Self-pay | Admitting: Family Medicine

## 2023-05-17 ENCOUNTER — Other Ambulatory Visit: Payer: Self-pay

## 2023-05-17 ENCOUNTER — Ambulatory Visit (INDEPENDENT_AMBULATORY_CARE_PROVIDER_SITE_OTHER): Payer: Managed Care, Other (non HMO) | Admitting: Family Medicine

## 2023-05-17 ENCOUNTER — Encounter: Payer: Self-pay | Admitting: Family Medicine

## 2023-05-17 VITALS — BP 106/72 | HR 72 | Temp 97.9°F | Resp 16 | Ht 66.0 in | Wt 164.7 lb

## 2023-05-17 DIAGNOSIS — E559 Vitamin D deficiency, unspecified: Secondary | ICD-10-CM

## 2023-05-17 DIAGNOSIS — E7849 Other hyperlipidemia: Secondary | ICD-10-CM

## 2023-05-17 DIAGNOSIS — Z8639 Personal history of other endocrine, nutritional and metabolic disease: Secondary | ICD-10-CM

## 2023-05-17 DIAGNOSIS — E663 Overweight: Secondary | ICD-10-CM

## 2023-05-17 DIAGNOSIS — K219 Gastro-esophageal reflux disease without esophagitis: Secondary | ICD-10-CM

## 2023-05-17 MED ORDER — VITAMIN D (ERGOCALCIFEROL) 1.25 MG (50000 UNIT) PO CAPS
50000.0000 [IU] | ORAL_CAPSULE | ORAL | 1 refills | Status: DC
Start: 2023-05-17 — End: 2023-11-08

## 2023-05-17 MED ORDER — WEGOVY 0.5 MG/0.5ML ~~LOC~~ SOAJ: 0.5000 mg | SUBCUTANEOUS | 1 refills | Status: DC

## 2023-05-17 NOTE — Progress Notes (Signed)
Name: Tanya Robinson   MRN: [id number]    DOB: 15-May-1975   Date:05/17/2023       Progress Note  Subjective  Chief Complaint  Chief Complaint  Patient presents with   Medical Management of Chronic Issues   HPI   History of Obesity: she states her high school weight was 140 lbs. When she delivered her son ( 1999 ) she was 176 lb and maintaining the weight around 170 lbs for many years. After that it went up to 180 lbs in early 2023 it went up to 194 lbs. She tried diet and exercise  She has been on GLP-1 for a while. She works from home, it is easier to eat healthier instead of grabbing fast food. She is currently going twice a week to the gym with her father for one hour, she also has a stationary bike that she rides when watching TV at night. She started [facility] 05/2022 weight was 192 lbs, she is now stable in the mid 160's , very happy with results, unable to go higher on dose because of increase of nausea.    Familial hyperlipidemia: mother takes cholesterol medication, father has diabetes, her LDL last year was 58 and in June 2023 was down to 173 discussed familial dyslipidemia. She is on Crestor since Summer 2024 and no side effects and LDL went down to 140, LDL particle number was moderate   Vitamin D : she is taking rx vitamin D weekly , last level was normal    GERD: she went to [facility] 01/23 with severe burning sensation on esophagus and dysphagia. She was given GI cocktail and sent home on PPI. Symptoms got a little worse when resumed Wegovy early 2024 doing much better now taking PPI prn only    Pre-diabetes: losing weight, going to the gym, last A1C was 5.4 % It used to be 5.7 % , continue Wegovy and life style modification , recheck it yearly    Patient Active Problem List   Diagnosis Date Noted   Colon cancer screening 12/26/2022   Prediabetes 10/03/2021   Obesity (BMI 30.0-34.9) 08/28/2021   Primary osteoarthritis of first carpometacarpal joint of right hand 08/28/2021   Carpal  tunnel syndrome of right wrist 08/28/2021   GERD without esophagitis 05/15/2021   Cervical high risk HPV (human papillomavirus) test positive 07/30/2016   Bee sting allergy 07/28/2015   Dyslipidemia 11/02/2014   Migraine without aura and responsive to treatment 11/02/2014   Vitamin D deficiency 11/02/2014   Urinary incontinence in female 11/02/2014    Past Surgical History:  Procedure Laterality Date   ABDOMINAL HYSTERECTOMY     COLONOSCOPY WITH PROPOFOL N/A 12/26/2022   Procedure: COLONOSCOPY WITH PROPOFOL;  Surgeon: Wyline Mood, MD;  Location: [facility] ENDOSCOPY;  Service: Gastroenterology;  Laterality: N/A;   TUBAL LIGATION  2009    Family History  Problem Relation Age of Onset   Hypertension Mother    Hyperlipidemia Mother    Diabetes Father     Social History   Tobacco Use   Smoking status: Never   Smokeless tobacco: Never  Substance Use Topics   Alcohol use: Not Currently    Comment: occasionally     Current Outpatient Medications:    EPINEPHrine 0.3 mg/0.3 mL IJ SOAJ injection, , Disp: , Rfl:    rosuvastatin (CRESTOR) 10 MG tablet, Take 1 tablet (10 mg total) by mouth daily., Disp: 90 tablet, Rfl: 3   Semaglutide-Weight Management (WEGOVY) 0.5 MG/0.5ML SOAJ, Inject 0.5 mg into  the skin once a week., Disp: 6 mL, Rfl: 0   Vitamin D, Ergocalciferol, (DRISDOL) 1.25 MG (50000 UNIT) CAPS capsule, Take 1 capsule (50,000 Units total) by mouth every 7 (seven) days., Disp: 12 capsule, Rfl: 1   pantoprazole (PROTONIX) 20 MG tablet, Take 1 tablet (20 mg total) by mouth 2 (two) times daily., Disp: 180 tablet, Rfl: 0  Allergies  Allergen Reactions   Tea Hives   Bee Venom     Bee    I personally reviewed active problem list, medication list, allergies with the patient/caregiver today.   ROS  Ten systems reviewed and is negative except as mentioned in HPI    Objective  Vitals:   05/17/23 1408  BP: 106/72  Pulse: 72  Resp: 16  Temp: 97.9 F (36.6 C)  TempSrc: Oral   Weight: 164 lb 11.2 oz (74.7 kg)  Height: 5\' 6"  (1.676 m)    Body mass index is 26.58 kg/m.  Physical Exam  Constitutional: Patient appears well-developed and well-nourished. No distress.  HEENT: head atraumatic, normocephalic, pupils equal and reactive to light,  neck supple, throat within normal limits Cardiovascular: Normal rate, regular rhythm and normal heart sounds.  No murmur heard. No BLE edema. Pulmonary/Chest: Effort normal and breath sounds normal. No respiratory distress. Abdominal: Soft.  There is no tenderness. Psychiatric: Patient has a normal mood and affect. behavior is normal. Judgment and thought content normal.   Recent Results (from the past 2160 hours)  NMR LipoProf + Graph     Status: Abnormal   Collection Time: 03/01/23  9:02 AM  Result Value Ref Range   LDL Particle Number [id number] (H) <1,000 nmol/L    Comment:                           Low                   < 1000                           Moderate         1000 - 1299                           Borderline-High  1300 - 1599                           High             1600 - 2000                           Very High             > 2000    LDL-C (NIH Calc) 140 (H) 0 - 99 mg/dL    Comment:                           Optimal               <  100                           Above optimal     100 -  129  Borderline        130 -  159                           High              160 -  189                           Very high             >  189    HDL-C 55 >39 mg/dL   Triglycerides 87 0 - 149 mg/dL   Cholesterol, Total 409 (H) 100 - 199 mg/dL   HDL Particle Number [id number] (L) >=30.5 umol/L   Small LDL Particle Number 414 <=527 nmol/L   LDL Size 21.5 >20.5 nm    Comment:  ----------------------------------------------------------                  ** INTERPRETATIVE INFORMATION**                  PARTICLE CONCENTRATION AND SIZE                     <--Lower CVD Risk   Higher CVD Risk-->   LDL AND  HDL PARTICLES   Percentile in Reference Population   HDL-P (total)        High     75th    50th    25th   Low                        >34.9    34.9    30.5    26.7   <26.7   Small LDL-P          Low      25th    50th    75th   High                        <117     117     527     839    >839   LDL Size   <-Large (Pattern A)->    <-Small (Pattern B)->                     23.0    20.6           20.5      19.0  ---------------------------------------------------------- Small LDL-P and LDL Size are associated with CVD risk, but not after LDL-P is taken into account.    LP-IR Score <25 <=45    Comment: INSULIN RESISTANCE MARKER     <--Insulin Sensitive    Insulin Resistant-->            Percentile in Reference Population Insulin Resistance Score LP-IR Score   Low   25th   50th   75th   High               <27   27     45     63     >63 LP-IR Score is inaccurate if patient is non-fasting. The LP-IR score is a laboratory developed index that has been associated with insulin resistance and diabetes risk and should be used as one component of a physician's clinical assessment.   Comp. Metabolic Panel (12)     Status: Abnormal  Collection Time: 03/01/23  9:02 AM  Result Value Ref Range   Glucose 69 (L) 70 - 99 mg/dL   BUN 6 6 - 24 mg/dL   Creatinine, Ser 4.09 0.57 - 1.00 mg/dL   eGFR 73 >81 XB/JYN/8.29   BUN/Creatinine Ratio 6 (L) 9 - 23   Sodium 141 134 - 144 mmol/L   Potassium 4.2 3.5 - 5.2 mmol/L   Chloride 102 96 - 106 mmol/L   Calcium 9.7 8.7 - 10.2 mg/dL   Total Protein 6.8 6.0 - 8.5 g/dL   Albumin 4.5 3.9 - 4.9 g/dL   Globulin, Total 2.3 1.5 - 4.5 g/dL   Bilirubin Total 0.2 0.0 - 1.2 mg/dL   Alkaline Phosphatase 59 44 - 121 IU/L   AST 10 0 - 40 IU/L  Lipid panel     Status: Abnormal   Collection Time: 03/01/23  9:02 AM  Result Value Ref Range   Cholesterol, Total 213 (H) 100 - 199 mg/dL   Triglycerides 92 0 - 149 mg/dL   HDL 56 >56 mg/dL   VLDL Cholesterol Cal 16 5 - 40  mg/dL   LDL Chol Calc (NIH) 213 (H) 0 - 99 mg/dL   Chol/HDL Ratio 3.8 0.0 - 4.4 ratio    Comment:                                   T. Chol/HDL Ratio                                             Men  Women                               1/2 Avg.Risk  3.4    3.3                                   Avg.Risk  5.0    4.4                                2X Avg.Risk  9.6    7.1                                3X Avg.Risk 23.4   11.0     Diabetic Foot Exam:     PHQ2/9:    05/17/2023    2:04 PM 02/13/2023    2:48 PM 11/19/2022    1:27 PM 10/08/2022    3:12 PM 08/20/2022    8:36 AM  Depression screen PHQ 2/9  Decreased Interest 0 0 0 0 0  Down, Depressed, Hopeless 0 0 0 0 0  PHQ - 2 Score 0 0 0 0 0  Altered sleeping 0 0 0 0 0  Tired, decreased energy 0 0 0 0 0  Change in appetite 0 0 0 0 0  Feeling bad or failure about yourself  0 0 0 0 0  Trouble concentrating 0 0 0 0 0  Moving slowly or fidgety/restless 0 0 0 0 0  Suicidal thoughts 0 0 0 0 0  PHQ-9 Score 0 0 0 0 0  Difficult doing work/chores Not difficult at all        phq 9 is negative  Fall Risk:    05/17/2023    2:04 PM 02/13/2023    2:48 PM 11/19/2022    1:27 PM 10/08/2022    3:12 PM 08/29/2022    8:31 AM  Fall Risk   Falls in the past year? 0 0 0 0 0  Number falls in past yr: 0  0    Injury with Fall? 0  0    Risk for fall due to : No Fall Risks No Fall Risks No Fall Risks  No Fall Risks  Follow up Falls prevention discussed;Education provided;Falls evaluation completed Falls prevention discussed Falls prevention discussed Falls prevention discussed;Education provided;Falls evaluation completed Falls prevention discussed     Assessment & Plan  1. Overweight (BMI 25.0-29.9)  - Semaglutide-Weight Management (WEGOVY) 0.5 MG/0.5ML SOAJ; Inject 0.5 mg into the skin once a week.  Dispense: 6 mL; Refill: 1  2. History of obesity in adulthood  - Semaglutide-Weight Management (WEGOVY) 0.5 MG/0.5ML SOAJ; Inject 0.5 mg into the  skin once a week.  Dispense: 6 mL; Refill: 1  3. Vitamin D deficiency  - Vitamin D, Ergocalciferol, (DRISDOL) 1.25 MG (50000 UNIT) CAPS capsule; Take 1 capsule (50,000 Units total) by mouth every 7 (seven) days.  Dispense: 12 capsule; Refill: 1  4. Familial hyperlipidemia (Primary)  Continue statin therapy   5. GERD without esophagitis  Improved , taking medication prn

## 2023-06-11 ENCOUNTER — Encounter: Payer: Self-pay | Admitting: Family Medicine

## 2023-06-28 ENCOUNTER — Telehealth: Payer: Self-pay

## 2023-06-28 NOTE — Telephone Encounter (Signed)
 Prior Auth on   Semaglutide-Weight Management (WEGOVY) 0.5 MG/0.5ML SOAJ

## 2023-07-05 ENCOUNTER — Telehealth: Payer: Self-pay | Admitting: Family Medicine

## 2023-07-05 NOTE — Telephone Encounter (Signed)
 Prior Authorization from [employer] [facility]) 0.5 MG/0.5ML SOAJ   Case #: [id number]

## 2023-08-06 ENCOUNTER — Telehealth: Payer: Self-pay | Admitting: Family Medicine

## 2023-08-06 NOTE — Telephone Encounter (Signed)
 Prior Auth from [facility]  Semaglutide-Weight Management [facility]) 0.5 MG/0.5ML SOAJ   Key: [id number]

## 2023-08-06 NOTE — Telephone Encounter (Signed)
PA done through cover my meds.

## 2023-09-17 ENCOUNTER — Telehealth: Payer: Self-pay | Admitting: Family Medicine

## 2023-09-17 NOTE — Telephone Encounter (Signed)
 Copied from CRM [id number]. Topic: Appointments - Scheduling Inquiry for Clinic >> Sep 17, 2023  8:08 AM Everette C wrote: Reason for CRM: The patient would like to be contacted to schedule their Physical with their PCP on 10/14/23 if possible . Please contact the patient further if/when possible

## 2023-09-17 NOTE — Telephone Encounter (Signed)
 Returned pt call to schedule her an appointment with her pcp

## 2023-09-26 ENCOUNTER — Other Ambulatory Visit: Payer: Self-pay | Admitting: Family Medicine

## 2023-09-26 DIAGNOSIS — Z1231 Encounter for screening mammogram for malignant neoplasm of breast: Secondary | ICD-10-CM

## 2023-11-05 ENCOUNTER — Encounter: Payer: Self-pay | Admitting: Family Medicine

## 2023-11-08 ENCOUNTER — Ambulatory Visit: Admitting: Family Medicine

## 2023-11-08 ENCOUNTER — Encounter: Payer: Self-pay | Admitting: Family Medicine

## 2023-11-08 VITALS — BP 118/76 | HR 94 | Resp 16 | Ht 66.13 in | Wt 164.0 lb

## 2023-11-08 DIAGNOSIS — Z91030 Bee allergy status: Secondary | ICD-10-CM

## 2023-11-08 DIAGNOSIS — E7849 Other hyperlipidemia: Secondary | ICD-10-CM | POA: Diagnosis not present

## 2023-11-08 DIAGNOSIS — K219 Gastro-esophageal reflux disease without esophagitis: Secondary | ICD-10-CM

## 2023-11-08 DIAGNOSIS — Z0001 Encounter for general adult medical examination with abnormal findings: Secondary | ICD-10-CM

## 2023-11-08 DIAGNOSIS — R7303 Prediabetes: Secondary | ICD-10-CM

## 2023-11-08 DIAGNOSIS — Z0000 Encounter for general adult medical examination without abnormal findings: Secondary | ICD-10-CM

## 2023-11-08 DIAGNOSIS — Z8639 Personal history of other endocrine, nutritional and metabolic disease: Secondary | ICD-10-CM

## 2023-11-08 DIAGNOSIS — E559 Vitamin D deficiency, unspecified: Secondary | ICD-10-CM

## 2023-11-08 DIAGNOSIS — E663 Overweight: Secondary | ICD-10-CM

## 2023-11-08 MED ORDER — VITAMIN D (ERGOCALCIFEROL) 1.25 MG (50000 UNIT) PO CAPS: 50000.0000 [IU] | ORAL_CAPSULE | ORAL | 1 refills | Status: DC

## 2023-11-08 MED ORDER — WEGOVY 1 MG/0.5ML ~~LOC~~ SOAJ: 1.0000 mg | SUBCUTANEOUS | 0 refills | Status: DC

## 2023-11-08 MED ORDER — ROSUVASTATIN CALCIUM 20 MG PO TABS: 20.0000 mg | ORAL_TABLET | Freq: Every day | ORAL | 1 refills | Status: DC

## 2023-11-08 NOTE — Progress Notes (Signed)
 Name: Tanya Robinson   MRN: [id number]    DOB: November 01, 1975   Date:11/08/2023       Progress Note  Subjective  Chief Complaint  Chief Complaint  Patient presents with   Annual Exam    HPI  Patient presents for annual CPE and follow up  Discussed the use of AI scribe software for clinical note transcription with the patient, who gave verbal consent to proceed.  History of Present Illness Tanya Robinson is a 48 year old female with familial hyperlipidemia and obesity who presents for a physical and follow-up visit.  She has familial hyperlipidemia, with her LDL cholesterol previously at 180 mg/dL, which later decreased to 173 mg/dL. She started rosuvastatin  last summer, and her LDL cholesterol was later measured at 140 mg/dL, though her LDL particle number was still borderline high. She takes rosuvastatin  at night without issues.  She has a history of prediabetes and insulin  resistance, with a previously elevated A1c. Since starting Wegovy  in February last year, her A1c has normalized. She has reduced fast food intake, particularly avoiding fried foods and burgers, and has lost weight from 194 lbs to 164 lbs, aiming to maintain a weight between 150-160 lbs. Her BMI decreased from 31.1 to 26.37. She exercises three days a week for an hour at [employer].  She experiences reflux and takes pantoprazole  20 mg twice daily, especially around the time of her Wegovy  shot, which causes bloating. She takes pantoprazole  20 mg twice daily, especially around the time of her Wegovy  shot, which causes bloating.  She uses minoxidil for traction alopecia, which is helping. She has an Epipen  for allergies to bees and tea, though she hasn't needed to use it recently. She takes a prescription for vitamin D  deficiency once a week.  No issues with alcohol and has never smoked. She is not interested in testing for sexually transmitted infections and reports no pain during intercourse or vaginal discharge. She notes  a decreased libido, attributing it to her age and post-hysterectomy status. She had a hysterectomy 14 years ago but retained her ovaries for a while, suggesting she may not be truly menopausal yet.      Diet: going to cut down on carbohydrates, pack healthier snacks to work  Exercise: continue regular physical activity   Last Eye Exam: completed Last Dental Exam: completed  Flowsheet Row Office Visit from 11/08/2023 in [facility]  AUDIT-C Score 0   Depression: Phq 9 is  negative    11/08/2023    9:38 AM 05/17/2023    2:04 PM 02/13/2023    2:48 PM 11/19/2022    1:27 PM 10/08/2022    3:12 PM  Depression screen PHQ 2/9  Decreased Interest 0 0 0 0 0  Down, Depressed, Hopeless 0 0 0 0 0  PHQ - 2 Score 0 0 0 0 0  Altered sleeping  0 0 0 0  Tired, decreased energy  0 0 0 0  Change in appetite  0 0 0 0  Feeling bad or failure about yourself   0 0 0 0  Trouble concentrating  0 0 0 0  Moving slowly or fidgety/restless  0 0 0 0  Suicidal thoughts  0 0 0 0  PHQ-9 Score  0 0 0 0  Difficult doing work/chores  Not difficult at all      Hypertension: BP Readings from Last 3 Encounters:  11/08/23 118/76  05/17/23 106/72  02/13/23 126/80   Obesity: Wt Readings  from Last 3 Encounters:  11/08/23 164 lb (74.4 kg)  05/17/23 164 lb 11.2 oz (74.7 kg)  02/13/23 166 lb 4.8 oz (75.4 kg)   BMI Readings from Last 3 Encounters:  11/08/23 26.37 kg/m  05/17/23 26.58 kg/m  02/13/23 26.84 kg/m     Vaccines: reviewed with the patient.   Hep C Screening: completed STD testing and prevention (HIV/chl/gon/syphilis): N/A Intimate partner violence: negative screen  Sexual History : no pain , no vaginal discharge  Menstrual History/LMP/Abnormal Bleeding: s/p hysterectomy  Discussed importance of follow up if any post-menopausal bleeding: not applicable  Incontinence Symptoms: negative for symptoms   Breast cancer:  - Last Mammogram: 10/2022 , already scheduled for  Monday  - BRCA gene screening:   Osteoporosis Prevention : Discussed high calcium  and vitamin D  supplementation, weight bearing exercises Bone density :discussed with patient    Cervical cancer screening: up-to-date  Skin cancer: Discussed monitoring for atypical lesions  Colorectal cancer: up to date    Lung cancer:  Low Dose CT Chest recommended if Age 37-80 years, 20 pack-year currently smoking OR have quit w/in 15years. Patient does not qualify for screen   ECG: 2023  Advanced Care Planning: A voluntary discussion about advance care planning including the explanation and discussion of advance directives.  Discussed health care proxy and Living will, and the patient was able to identify a health care proxy as husband .  Patient does have a living will and power of attorney of health care   Patient Active Problem List   Diagnosis Date Noted   Colon cancer screening 12/26/2022   Prediabetes 10/03/2021   Obesity (BMI 30.0-34.9) 08/28/2021   Primary osteoarthritis of first carpometacarpal joint of right hand 08/28/2021   Carpal tunnel syndrome of right wrist 08/28/2021   GERD without esophagitis 05/15/2021   Cervical high risk HPV (human papillomavirus) test positive 07/30/2016   Bee sting allergy 07/28/2015   Dyslipidemia 11/02/2014   Migraine without aura and responsive to treatment 11/02/2014   Vitamin D  deficiency 11/02/2014   Urinary incontinence in female 11/02/2014    Past Surgical History:  Procedure Laterality Date   ABDOMINAL HYSTERECTOMY     COLONOSCOPY WITH PROPOFOL  N/A 12/26/2022   Procedure: COLONOSCOPY WITH PROPOFOL ;  Surgeon: Therisa Bi, MD;  Location: [facility] ENDOSCOPY;  Service: Gastroenterology;  Laterality: N/A;   TUBAL LIGATION  2009    Family History  Problem Relation Age of Onset   Hypertension Mother    Hyperlipidemia Mother    Diabetes Father     Social History   Socioeconomic History   Marital status: Married    Spouse name: [place]    Number of  children: 2   Years of education: Not on file   Highest education level: High school graduate  Occupational History   Occupation: Financial controller: [employer]  Tobacco Use   Smoking status: Never   Smokeless tobacco: Never  Vaping Use   Vaping status: Never Used  Substance and Sexual Activity   Alcohol use: Not Currently    Comment: occasionally   Drug use: No   Sexual activity: Yes    Partners: Male    Birth control/protection: Surgical  Other Topics Concern   Not on file  Social History Narrative   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 She started at [employer] April 2019, she has been working from home since March 2020 because of pandemic    She has been living with  boyfriend ( together since 2011)   He was previously married, and he has twins from previously relationship.    Her two children, are grown now. A son and a daughter.    Daughter had a miscarriage in 2021   Social Drivers of Health   Financial Resource Strain: Low Risk  (11/08/2023)   Overall Financial Resource Strain (CARDIA)    Difficulty of Paying Living Expenses: Not hard at all  Food Insecurity: No Food Insecurity (11/08/2023)   Hunger Vital Sign    Worried About Running Out of Food in the Last Year: Never true    Ran Out of Food in the Last Year: Never true  Transportation Needs: No Transportation Needs (11/08/2023)   PRAPARE - Administrator, Civil Service (Medical): No    Lack of Transportation (Non-Medical): No  Physical Activity: Sufficiently Active (11/08/2023)   Exercise Vital Sign    Days of Exercise per Week: 3 days    Minutes of Exercise per Session: 60 min  Stress: No Stress Concern Present (11/08/2023)   [employer] of Occupational Health - Occupational Stress Questionnaire    Feeling of Stress: Only a little  Social Connections: Moderately Integrated (11/08/2023)   Social Connection and Isolation Panel    Frequency of Communication with  Friends and Family: More than three times a week    Frequency of Social Gatherings with Friends and Family: More than three times a week    Attends Religious Services: 1 to 4 times per year    Active Member of [employer] or Organizations: No    Attends Banker Meetings: Never    Marital Status: Married  Catering manager Violence: Not At Risk (11/08/2023)   Humiliation, Afraid, Rape, and Kick questionnaire    Fear of Current or Ex-Partner: No    Emotionally Abused: No    Physically Abused: No    Sexually Abused: No     Current Outpatient Medications:    EPINEPHrine  0.3 mg/0.3 mL IJ SOAJ injection, , Disp: , Rfl:    minoxidil (LONITEN) 2.5 MG tablet, Take 2.5 mg by mouth daily., Disp: , Rfl:    pantoprazole  (PROTONIX ) 20 MG tablet, Take 1 tablet (20 mg total) by mouth 2 (two) times daily., Disp: 180 tablet, Rfl: 0   rosuvastatin  (CRESTOR ) 10 MG tablet, Take 1 tablet (10 mg total) by mouth daily., Disp: 90 tablet, Rfl: 3   Semaglutide -Weight Management (WEGOVY ) 0.5 MG/0.5ML SOAJ, Inject 0.5 mg into the skin once a week., Disp: 6 mL, Rfl: 1   spironolactone (ALDACTONE) 100 MG tablet, Take 100 mg by mouth daily., Disp: , Rfl:    Vitamin D , Ergocalciferol , (DRISDOL ) 1.25 MG (50000 UNIT) CAPS capsule, Take 1 capsule (50,000 Units total) by mouth every 7 (seven) days., Disp: 12 capsule, Rfl: 1  Allergies  Allergen Reactions   Tea Hives   Bee Venom     Bee     ROS  Ten systems reviewed and is negative except as mentioned in HPI    Objective  Vitals:   11/08/23 0940  BP: 118/76  Pulse: 94  Resp: 16  SpO2: 100%  Weight: 164 lb (74.4 kg)  Height: 5' 6.13 (1.68 m)    Body mass index is 26.37 kg/m.  Physical Exam  Constitutional: Patient appears well-developed and well-nourished. No distress.  HENT: Head: Normocephalic and atraumatic. Ears: B TMs ok, no erythema or effusion; Nose: Nose normal. Mouth/Throat: Oropharynx is clear and moist. No oropharyngeal exudate.   Eyes: Conjunctivae  and EOM are normal. Pupils are equal, round, and reactive to light. No scleral icterus.  Neck: Normal range of motion. Neck supple. No JVD present. No thyromegaly present.  Cardiovascular: Normal rate, regular rhythm and normal heart sounds.  No murmur heard. No BLE edema. Pulmonary/Chest: Effort normal and breath sounds normal. No respiratory distress. Abdominal: Soft. Bowel sounds are normal, no distension. There is no tenderness. no masses Breast: no lumps or masses, no nipple discharge or rashes FEMALE GENITALIA:  External genitalia normal External urethra normal Vaginal vault normal without discharge or lesions Cervix normal without discharge or lesions Bimanual exam normal without masses RECTAL: not done  Musculoskeletal: Normal range of motion, no joint effusions. No gross deformities Neurological: he is alert and oriented to person, place, and time. No cranial nerve deficit. Coordination, balance, strength, speech and gait are normal.  Skin: Skin is warm and dry. No rash noted. No erythema.  Psychiatric: Patient has a normal mood and affect. behavior is normal. Judgment and thought content normal.    Assessment & Plan   Assessment & Plan Familial Hypercholesterolemia Persistent elevated LDL cholesterol despite rosuvastatin . Likely familial etiology. - Increase rosuvastatin  to 20 mg daily. - Monitor for side effects such as muscle aches. - Provide a 38-month supply of rosuvastatin .  Obesity Significant weight loss achieved. Current BMI 26.37. Goal weight 150-160 lbs. - Increase Wegovy  to 1 mg. - Continue dietary modifications to reduce carbohydrate intake. - Encourage healthy snacks such as nuts, carrots, and cherry tomatoes. - Maintain regular physical activity, aiming for 1 hour, 3 days a week. - Provide a 25-month supply of Wegovy .  Prediabetes A1c normalized with Wegovy  and lifestyle changes. - Continue current management with Wegovy  and lifestyle  modifications.  Gastroesophageal Reflux Disease Reflux symptoms improved with weight loss. - Reduce pantoprazole  to once daily, except on the day before and the day of Wegovy  injection. - Monitor symptoms and adjust as needed.  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 No recent episodes requiring Epipen  use. - Check expiration date of current Epipen  and replace if expired.  Vitamin D  Deficiency Managed with prescription vitamin D . - Continue current vitamin D  supplementation. - Check vitamin D  levels at next follow-up.  Postmenopausal State (s/p Hysterectomy with Ovarian Conservation) Decreased libido possibly related to hormonal changes. Discussed potential benefits and risks of estrogen hormone replacement therapy. - Discuss potential benefits and risks of estrogen hormone replacement therapy. - Consider cardiac calcium  score prior to hormone therapy initiation.       -USPSTF grade A and B recommendations reviewed with patient; age-appropriate recommendations, preventive care, screening tests, etc discussed and encouraged; healthy living encouraged; see AVS for patient education given to patient -Discussed importance of 150 minutes of physical activity weekly, eat two servings of fish weekly, eat one serving of tree nuts ( cashews, pistachios, pecans, almonds.SABRA) every other day, eat 6 servings of fruit/vegetables daily and drink plenty of water and avoid sweet beverages.   -Reviewed Health Maintenance: Yes.

## 2023-11-11 ENCOUNTER — Ambulatory Visit
Admission: RE | Admit: 2023-11-11 | Discharge: 2023-11-11 | Disposition: A | Discharge status: Home / Self Care | Source: Ambulatory Visit | Attending: Family Medicine | Admitting: Family Medicine

## 2023-11-11 ENCOUNTER — Other Ambulatory Visit: Payer: Self-pay | Admitting: Family Medicine

## 2023-11-11 DIAGNOSIS — Z1231 Encounter for screening mammogram for malignant neoplasm of breast: Secondary | ICD-10-CM | POA: Diagnosis present

## 2023-11-11 DIAGNOSIS — E559 Vitamin D deficiency, unspecified: Secondary | ICD-10-CM

## 2023-11-14 ENCOUNTER — Ambulatory Visit: Payer: Self-pay | Admitting: Family Medicine

## 2023-11-14 LAB — CBC WITH DIFFERENTIAL/PLATELET
Basophils Absolute: 0.1 x10E3/uL (ref 0.0–0.2)
Basos: 1 %
EOS (ABSOLUTE): 0.1 x10E3/uL (ref 0.0–0.4)
Eos: 2 %
Hematocrit: 42.9 % (ref 34.0–46.6)
Hemoglobin: 13.9 g/dL (ref 11.1–15.9)
Immature Grans (Abs): 0 x10E3/uL (ref 0.0–0.1)
Immature Granulocytes: 0 %
Lymphocytes Absolute: 1.6 x10E3/uL (ref 0.7–3.1)
Lymphs: 36 %
MCH: 29.8 pg (ref 26.6–33.0)
MCHC: 32.4 g/dL (ref 31.5–35.7)
MCV: 92 fL (ref 79–97)
Monocytes Absolute: 0.3 x10E3/uL (ref 0.1–0.9)
Monocytes: 8 %
Neutrophils Absolute: 2.4 x10E3/uL (ref 1.4–7.0)
Neutrophils: 53 %
Platelets: 359 x10E3/uL (ref 150–450)
RBC: 4.66 x10E6/uL (ref 3.77–5.28)
RDW: 13.2 % (ref 11.7–15.4)
WBC: 4.5 x10E3/uL (ref 3.4–10.8)

## 2023-11-14 LAB — COMPREHENSIVE METABOLIC PANEL WITH GFR
ALT: 11 IU/L (ref 0–32)
AST: 12 IU/L (ref 0–40)
Albumin: 4.6 g/dL (ref 3.9–4.9)
Alkaline Phosphatase: 53 IU/L (ref 44–121)
BUN/Creatinine Ratio: 7 — ABNORMAL LOW (ref 9–23)
BUN: 6 mg/dL (ref 6–24)
Bilirubin Total: 0.2 mg/dL (ref 0.0–1.2)
CO2: 24 mmol/L (ref 20–29)
Calcium: 10 mg/dL (ref 8.7–10.2)
Chloride: 101 mmol/L (ref 96–106)
Creatinine, Ser: 0.91 mg/dL (ref 0.57–1.00)
Globulin, Total: 2.5 g/dL (ref 1.5–4.5)
Glucose: 90 mg/dL (ref 70–99)
Potassium: 4.4 mmol/L (ref 3.5–5.2)
Sodium: 140 mmol/L (ref 134–144)
Total Protein: 7.1 g/dL (ref 6.0–8.5)
eGFR: 77 mL/min/1.73 (ref >59)

## 2023-11-14 LAB — LIPID PANEL
Chol/HDL Ratio: 3.3 ratio (ref 0.0–4.4)
Cholesterol, Total: 224 mg/dL — ABNORMAL HIGH (ref 100–199)
HDL: 67 mg/dL (ref >39)
LDL Chol Calc (NIH): 145 mg/dL — ABNORMAL HIGH (ref 0–99)
Triglycerides: 71 mg/dL (ref 0–149)
VLDL Cholesterol Cal: 12 mg/dL (ref 5–40)

## 2023-11-14 LAB — VITAMIN D 25 HYDROXY (VIT D DEFICIENCY, FRACTURES): Vit D, 25-Hydroxy: 56 ng/mL (ref 30.0–100.0)

## 2023-11-14 LAB — B12 AND FOLATE PANEL
Folate: 11.4 ng/mL (ref >3.0)
Vitamin B-12: 468 pg/mL (ref 232–1245)

## 2023-11-14 LAB — HEPATITIS B SURFACE ANTIBODY,QUALITATIVE: Hep B Surface Ab, Qual: NONREACTIVE

## 2023-11-14 LAB — TSH: TSH: 3.18 u[IU]/mL (ref 0.450–4.500)

## 2023-11-23 ENCOUNTER — Other Ambulatory Visit: Payer: Self-pay | Admitting: Family Medicine

## 2024-02-11 ENCOUNTER — Ambulatory Visit: Admitting: Family Medicine

## 2024-03-30 ENCOUNTER — Ambulatory Visit: Admitting: Family Medicine

## 2024-05-12 ENCOUNTER — Ambulatory Visit: Admitting: Family Medicine

## 2024-05-27 ENCOUNTER — Ambulatory Visit: Admitting: Family Medicine

## 2024-05-27 ENCOUNTER — Encounter: Payer: Self-pay | Admitting: Family Medicine

## 2024-05-27 VITALS — BP 118/76 | HR 63 | Resp 16 | Ht 66.13 in | Wt 166.7 lb

## 2024-05-27 DIAGNOSIS — F341 Dysthymic disorder: Secondary | ICD-10-CM

## 2024-05-27 DIAGNOSIS — E559 Vitamin D deficiency, unspecified: Secondary | ICD-10-CM

## 2024-05-27 DIAGNOSIS — Z8639 Personal history of other endocrine, nutritional and metabolic disease: Secondary | ICD-10-CM

## 2024-05-27 DIAGNOSIS — E7849 Other hyperlipidemia: Secondary | ICD-10-CM

## 2024-05-27 DIAGNOSIS — Z91030 Bee allergy status: Secondary | ICD-10-CM

## 2024-05-27 DIAGNOSIS — Z23 Encounter for immunization: Secondary | ICD-10-CM

## 2024-05-27 DIAGNOSIS — R7303 Prediabetes: Secondary | ICD-10-CM

## 2024-05-27 DIAGNOSIS — K219 Gastro-esophageal reflux disease without esophagitis: Secondary | ICD-10-CM

## 2024-05-27 DIAGNOSIS — L659 Nonscarring hair loss, unspecified: Secondary | ICD-10-CM

## 2024-05-27 MED ORDER — ROSUVASTATIN CALCIUM 20 MG PO TABS: 20.0000 mg | ORAL_TABLET | Freq: Every day | ORAL | 1 refills | Status: AC

## 2024-05-27 MED ORDER — VITAMIN D (ERGOCALCIFEROL) 1.25 MG (50000 UNIT) PO CAPS: 50000.0000 [IU] | ORAL_CAPSULE | ORAL | 1 refills | Status: AC

## 2024-05-27 MED ORDER — BUPROPION HCL ER (XL) 150 MG PO TB24: 150.0000 mg | ORAL_TABLET | Freq: Every day | ORAL | 1 refills | Status: AC

## 2024-05-27 NOTE — Progress Notes (Signed)
 Name: Tanya Robinson   MRN: [id number]    DOB: 12/10/1975   Date:05/27/2024       Progress Note  Subjective  Chief Complaint  Chief Complaint  Patient presents with   Medical Management of Chronic Issues   Discussed the use of AI scribe software for clinical note transcription with the patient, who gave verbal consent to proceed.  History of Present Illness Tanya Robinson is a 49 year old female who presents for a three-month follow-up after discontinuing Wegovy .  She discontinued Wegovy  due to supply issues and has been without it since before Thanksgiving. Despite this, her weight has remained stable, with a slight increase from 164 lbs in July to 166 lbs today. She notes increased appetite and finishing meals, attributing this to not being on Wegovy . While on Wegovy , she experienced burning sensations in her stomach, even on a low dose, and has mixed feelings about discontinuing the medication.  She has a history of familial  dyslipidemia and was previously taking rosuvastatin  regularly but has become inconsistent due to a change in her schedule.  She experiences occasional reflux, which has improved since stopping Wegovy . She takes pantoprazole  as needed, particularly before consuming trigger foods like spaghetti.  She has a history of alopecia with a small bald spot, for which she was taking spironolactone and minoxidil. She discontinued both due to a suspected allergic reaction and reports that her hair has grown back, possibly due to reduced stress.  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 She enjoys spending time with her grandchildren, picking them up on Fridays, which she finds fulfilling.  Reports increased appetite and finishing meals since stopping Wegovy .  She is about to turn 40 and has been feeling a little down over the past few months, eating more, not having motivation to exercise and questioning her life.   Patient Active  Problem List   Diagnosis Date Noted   Prediabetes 10/03/2021   Primary osteoarthritis of first carpometacarpal joint of right hand 08/28/2021   Carpal tunnel syndrome of right wrist 08/28/2021   GERD without esophagitis 05/15/2021   Cervical high risk HPV (human papillomavirus) test positive 07/30/2016   Bee sting allergy 07/28/2015   Dyslipidemia 11/02/2014   Migraine without aura and responsive to treatment 11/02/2014   Vitamin D  deficiency 11/02/2014   Urinary incontinence in female 11/02/2014    Past Surgical History:  Procedure Laterality Date   ABDOMINAL HYSTERECTOMY     COLONOSCOPY WITH PROPOFOL  N/A 12/26/2022   Procedure: COLONOSCOPY WITH PROPOFOL ;  Surgeon: Therisa Bi, MD;  Location: [facility] ENDOSCOPY;  Service: Gastroenterology;  Laterality: N/A;   TUBAL LIGATION  2009    Family History  Problem Relation Age of Onset   Hypertension Mother    Hyperlipidemia Mother    Diabetes Father     Social History   Tobacco Use   Smoking status: Never   Smokeless tobacco: Never  Substance Use Topics   Alcohol use: Not Currently    Comment: occasionally    Current Medications[1]  Allergies[2]  I personally reviewed active problem list, medication list, allergies, family history with the patient/caregiver today.   ROS  Ten systems reviewed and is negative except as mentioned in HPI    Objective Physical Exam  CONSTITUTIONAL: Patient appears well-developed and well-nourished.  No distress. HEENT: Head atraumatic, normocephalic, neck supple. CARDIOVASCULAR: Normal rate, regular rhythm and normal heart sounds.  No murmur heard. No BLE edema. PULMONARY: Effort normal and breath  sounds normal. No respiratory distress. ABDOMINAL: There is no tenderness or distention. MUSCULOSKELETAL: Normal gait. Without gross motor or sensory deficit. PSYCHIATRIC: Patient has a normal mood and affect. behavior is normal. Judgment and thought content normal.  Vitals:   05/27/24 1055   BP: 118/76  Pulse: 63  Resp: 16  SpO2: 97%  Weight: 166 lb 11.2 oz (75.6 kg)  Height: 5' 6.13 (1.68 m)    Body mass index is 26.8 kg/m.    PHQ2/9:    05/27/2024   10:55 AM 11/08/2023    9:38 AM 05/17/2023    2:04 PM 02/13/2023    2:48 PM 11/19/2022    1:27 PM  Depression screen PHQ 2/9  Decreased Interest 0 0 0 0 0  Down, Depressed, Hopeless 0 0 0 0 0  PHQ - 2 Score 0 0 0 0 0  Altered sleeping   0 0 0  Tired, decreased energy   0 0 0  Change in appetite   0 0 0  Feeling bad or failure about yourself    0 0 0  Trouble concentrating   0 0 0  Moving slowly or fidgety/restless   0 0 0  Suicidal thoughts   0 0 0  PHQ-9 Score   0  0  0   Difficult doing work/chores   Not difficult at all       Data saved with a previous flowsheet row definition    phq 9 is negative  Fall Risk:    05/27/2024   10:55 AM 11/08/2023    9:38 AM 05/17/2023    2:04 PM 02/13/2023    2:48 PM 11/19/2022    1:27 PM  Fall Risk   Falls in the past year? 0 0 0 0 0  Number falls in past yr: 0 0 0  0  Injury with Fall? 0 0  0   0   Risk for fall due to : No Fall Risks No Fall Risks No Fall Risks No Fall Risks No Fall Risks  Follow up Falls evaluation completed Falls evaluation completed Falls prevention discussed;Education provided;Falls evaluation completed Falls prevention discussed Falls prevention discussed     Data saved with a previous flowsheet row definition     Assessment & Plan Dysthymia Discussed Wellbutrin  for motivation and appetite control, emphasizing lifestyle changes. - Prescribed Wellbutrin . - Encouraged community activities for social interaction. - Advised on gratitude journaling.  Familial hyperlipidemia LDL remains elevated. Emphasized consistent medication adherence. - Continue rosuvastatin  with consistent intake. - Advised on strategies to remember medication.  Prediabetes Discussed importance of monitoring symptoms. - Monitor for symptoms such as increased hunger,  thirst, or urination.  Gastroesophageal reflux disease Symptoms improved after stopping Wegovy . Pantoprazole  used as needed. - Continue pantoprazole  as needed.  Vitamin D  deficiency Prescription needed for vitamin D . - Sent prescription for vitamin D  to pharmacy.  Nonscarring hair loss Hair loss possibly related to stress or medication. Improvement noted with reduced stress.  General Health Maintenance Discussed vaccinations and potential side effects. - Administered shingles and flu vaccines. - Schedule follow-up for second shingles and first hepatitis B vaccines. - Plan for second hepatitis B vaccine at next visit.        [1]  Current Outpatient Medications:    EPINEPHrine  0.3 mg/0.3 mL IJ SOAJ injection, , Disp: , Rfl:    minoxidil (LONITEN) 2.5 MG tablet, Take 2.5 mg by mouth daily., Disp: , Rfl:    pantoprazole  (PROTONIX ) 20 MG tablet, Take 1 tablet (  20 mg total) by mouth 2 (two) times daily., Disp: 180 tablet, Rfl: 0   rosuvastatin  (CRESTOR ) 20 MG tablet, Take 1 tablet (20 mg total) by mouth daily., Disp: 90 tablet, Rfl: 1   Semaglutide -Weight Management (WEGOVY ) 1 MG/0.5ML SOAJ, Inject 1 mg into the skin once a week., Disp: 6 mL, Rfl: 0   spironolactone (ALDACTONE) 100 MG tablet, Take 100 mg by mouth daily., Disp: , Rfl:    Vitamin D , Ergocalciferol , (DRISDOL ) 1.25 MG (50000 UNIT) CAPS capsule, Take 1 capsule (50,000 Units total) by mouth every 7 (seven) days., Disp: 12 capsule, Rfl: 1 [2]  Allergies Allergen Reactions   Tea Hives   Bee Venom     Bee

## 2024-07-03 ENCOUNTER — Ambulatory Visit: Admitting: Orthopedic Surgery

## 2024-08-06 ENCOUNTER — Ambulatory Visit

## 2024-09-04 ENCOUNTER — Ambulatory Visit: Admitting: Family Medicine
